# Patient Record
Sex: Male | Born: 1960 | Race: Black or African American | Hispanic: No | Marital: Married | Smoking: Current every day smoker
Health system: Southern US, Community
[De-identification: ages and names within clinical notes are randomized; demographics above are authoritative.]

## PROBLEM LIST (undated history)

## (undated) DIAGNOSIS — E119 Type 2 diabetes mellitus without complications: Secondary | ICD-10-CM

## (undated) DIAGNOSIS — M069 Rheumatoid arthritis, unspecified: Secondary | ICD-10-CM

## (undated) DIAGNOSIS — R0600 Dyspnea, unspecified: Secondary | ICD-10-CM

## (undated) DIAGNOSIS — I10 Essential (primary) hypertension: Secondary | ICD-10-CM

## (undated) DIAGNOSIS — G629 Polyneuropathy, unspecified: Secondary | ICD-10-CM

## (undated) DIAGNOSIS — I639 Cerebral infarction, unspecified: Secondary | ICD-10-CM

## (undated) HISTORY — DX: Polyneuropathy, unspecified: G62.9

## (undated) HISTORY — PX: KNEE SURGERY: SHX244

---

## 2016-08-18 ENCOUNTER — Encounter (HOSPITAL_COMMUNITY): Payer: Self-pay | Admitting: Emergency Medicine

## 2016-08-18 DIAGNOSIS — F17210 Nicotine dependence, cigarettes, uncomplicated: Secondary | ICD-10-CM | POA: Insufficient documentation

## 2016-08-18 DIAGNOSIS — R739 Hyperglycemia, unspecified: Secondary | ICD-10-CM | POA: Insufficient documentation

## 2016-08-18 DIAGNOSIS — R2241 Localized swelling, mass and lump, right lower limb: Secondary | ICD-10-CM | POA: Insufficient documentation

## 2016-08-18 DIAGNOSIS — R51 Headache: Secondary | ICD-10-CM | POA: Insufficient documentation

## 2016-08-18 DIAGNOSIS — I10 Essential (primary) hypertension: Secondary | ICD-10-CM | POA: Insufficient documentation

## 2016-08-18 LAB — CBC
HCT: 46 % (ref 39.0–52.0)
Hemoglobin: 15.3 g/dL (ref 13.0–17.0)
MCH: 29.4 pg (ref 26.0–34.0)
MCHC: 33.3 g/dL (ref 30.0–36.0)
MCV: 88.3 fL (ref 78.0–100.0)
Platelets: 277 10*3/uL (ref 150–400)
RBC: 5.21 MIL/uL (ref 4.22–5.81)
RDW: 13.4 % (ref 11.5–15.5)
WBC: 11.5 10*3/uL — ABNORMAL HIGH (ref 4.0–10.5)

## 2016-08-18 LAB — BASIC METABOLIC PANEL
Anion gap: 12 (ref 5–15)
BUN: 12 mg/dL (ref 6–20)
CO2: 21 mmol/L — ABNORMAL LOW (ref 22–32)
Calcium: 9.5 mg/dL (ref 8.9–10.3)
Chloride: 106 mmol/L (ref 101–111)
Creatinine, Ser: 0.95 mg/dL (ref 0.61–1.24)
GFR calc Af Amer: >60 mL/min (ref >60)
GFR calc non Af Amer: >60 mL/min (ref >60)
Glucose, Bld: 164 mg/dL — ABNORMAL HIGH (ref 65–99)
Potassium: 4.1 mmol/L (ref 3.5–5.1)
Sodium: 139 mmol/L (ref 135–145)

## 2016-08-18 LAB — URINALYSIS, ROUTINE W REFLEX MICROSCOPIC
Bacteria, UA: NONE SEEN
Bilirubin Urine: NEGATIVE
Glucose, UA: >=500 mg/dL — AB
Hgb urine dipstick: NEGATIVE
Ketones, ur: NEGATIVE mg/dL
Leukocytes, UA: NEGATIVE
Nitrite: NEGATIVE
Protein, ur: NEGATIVE mg/dL
Specific Gravity, Urine: 1.016 (ref 1.005–1.030)
pH: 6 (ref 5.0–8.0)

## 2016-08-18 LAB — CBG MONITORING, ED: Glucose-Capillary: 179 mg/dL — ABNORMAL HIGH (ref 65–99)

## 2016-08-18 NOTE — ED Triage Notes (Signed)
Per [facility], pt c/o hyperglycemia, blood sugar at home was 4225, pt took 4 units fast acting insulin, [facility] CBG-204 at 2130, pt also c/o bilateral lower leg swelling. Pt also c/o numbness and tingling to bilateral feet.

## 2016-08-19 ENCOUNTER — Emergency Department (HOSPITAL_COMMUNITY): Payer: Self-pay

## 2016-08-19 ENCOUNTER — Emergency Department (HOSPITAL_COMMUNITY)
Admission: EM | Admit: 2016-08-19 | Discharge: 2016-08-19 | Disposition: A | Discharge status: Home / Self Care | Payer: Self-pay | Attending: Emergency Medicine | Admitting: Emergency Medicine

## 2016-08-19 DIAGNOSIS — I10 Essential (primary) hypertension: Secondary | ICD-10-CM

## 2016-08-19 DIAGNOSIS — E1142 Type 2 diabetes mellitus with diabetic polyneuropathy: Secondary | ICD-10-CM

## 2016-08-19 HISTORY — DX: Essential (primary) hypertension: I10

## 2016-08-19 HISTORY — DX: Type 2 diabetes mellitus without complications: E11.9

## 2016-08-19 LAB — CBG MONITORING, ED: Glucose-Capillary: 140 mg/dL — ABNORMAL HIGH (ref 65–99)

## 2016-08-19 MED ORDER — ACETAMINOPHEN 500 MG PO TABS
1000.0000 mg | ORAL_TABLET | Freq: Once | ORAL | Status: AC
  Administered 2016-08-19: 1000 mg via ORAL
  Filled 2016-08-19: qty 2

## 2016-08-19 MED ORDER — AMLODIPINE BESYLATE 5 MG PO TABS
5.0000 mg | ORAL_TABLET | Freq: Once | ORAL | Status: AC
  Administered 2016-08-19: 5 mg via ORAL
  Filled 2016-08-19: qty 1

## 2016-08-19 MED ORDER — GABAPENTIN 100 MG PO CAPS: 100.0000 mg | ORAL_CAPSULE | Freq: Three times a day (TID) | ORAL | 0 refills | Status: DC

## 2016-08-19 MED ORDER — AMLODIPINE BESYLATE 5 MG PO TABS: 5.0000 mg | ORAL_TABLET | Freq: Every day | ORAL | 0 refills | Status: DC

## 2016-08-19 NOTE — ED Provider Notes (Signed)
By signing my name below, I, Georgette Shell, attest that this documentation has been prepared under the direction and in the presence of Winta Barcelo, Delice Bison, DO. Electronically Signed: Georgette Shell, ED Scribe. 08/19/16. 2:37 AM.  TIME SEEN: 2:08 AM  CHIEF COMPLAINT:  Chief Complaint  Patient presents with  . Hypertension  . Hyperglycemia   HPI:  HPI Comments: Tony Ortega is a 56 y.o. male with h/o DM and HTN, who presents to the Emergency Department complaining of gradually worsening bilateral foot pain beginning in January 2018. States he thinks is from his diabetes. He has been told he has neuropathy. He states he decided to come to the ED today because he noticed swelling and discoloration to the area earlier but this has improved. Pt has h/o DM and notes his blood sugar was elevated at 425; he then subsequently took 4 units of fast-acting insulin which then brought it down to 204 at ~9:30p last night.   Pt is also complaining of a headache onset 4-5 hours ago. Pt has been compliant with his medications. He notes he has been incarcerated for 30 years and was just released yesterday and states he does not have a PCP he regularly follows up with at this time. Pt denies fever, chills, focal numbness, focal weakness, vomiting, diarrhea, visual disturbance, difficulty breathing, or any other associated symptoms.  ROS: See HPI Constitutional: no fever  Eyes: no drainage  ENT: no runny nose   Cardiovascular: no chest pain  Resp: no SOB  GI: no vomiting GU: no dysuria Integumentary: no rash  Allergy: no hives  Musculoskeletal: leg swelling  Neurological: no slurred speech ROS otherwise negative  PAST MEDICAL HISTORY/PAST SURGICAL HISTORY:  Past Medical History:  Diagnosis Date  . Diabetes mellitus without complication ([place])   . Hypertension     MEDICATIONS:  Prior to Admission medications   Not on File    ALLERGIES:  No Known Allergies  SOCIAL HISTORY:  Social History  Substance Use  Topics  . Smoking status: Current Some Day Smoker  . Smokeless tobacco: Never Used  . Alcohol use No    FAMILY HISTORY: No family history on file.  EXAM: BP (!) 150/112 (BP Location: Right Arm)   Pulse 86   Temp 98.1 F (36.7 C) (Oral)   Resp 16   Ht 5\' 7"  (1.702 m)   Wt 160 lb (72.6 kg)   SpO2 99%   BMI 25.06 kg/m  CONSTITUTIONAL: Alert and oriented and responds appropriately to questions. Chronically ill-appearing HEAD: Normocephalic EYES: Conjunctivae clear, pupils appear equal, EOMI ENT: normal nose; moist mucous membranes NECK: Supple, no meningismus, no nuchal rigidity, no LAD  CARD: RRR; S1 and S2 appreciated; no murmurs, no clicks, no rubs, no gallops RESP: Normal chest excursion without splinting or tachypnea; breath sounds clear and equal bilaterally; no wheezes, no rhonchi, no rales, no hypoxia or respiratory distress, speaking full sentences ABD/GI: Normal bowel sounds; non-distended; soft, non-tender, no rebound, no guarding, no peritoneal signs, no hepatosplenomegaly BACK:  The back appears normal and is non-tender to palpation, there is no CVA tenderness EXT: Normal ROM in all joints; non-tender to palpation; no edema; normal capillary refill; no cyanosis, no calf tenderness or swelling, 2+ radial and DP pulses palpated bilaterally, extremity is warm and well-perfused, patient does have some chronic venous stasis dermatitis noted to both ankles but no swelling, erythema, warmth or joint effusion    SKIN: Normal color for age and race; warm; no rash NEURO: Moves all extremities  equally; Strength 5/5 in all four extremities.  Normal sensation diffusely.  CN 2-12 grossly intact.  No dysmetria to finger to nose testing bilaterally.  Normal speech.  Normal gait. PSYCH: The patient's mood and manner are appropriate. Grooming and personal hygiene are appropriate.  MEDICAL DECISION MAKING: Patient here with complaints of chronic neuropathy since January. I think this is  related to his diabetes. Was hyperglycemic earlier but this is now with insulin. Blood glucose is now on 140.  He is hypertensive here and complains of mild headache. Headache present for the past 4-5 hours. We'll obtain CT of his head for further evaluation. Will give amlodipine. He is on hydrochlorothiazide and lisinopril and reports compliance.  ED PROGRESS: Head CT unremarkable.  4:40 AM  Pt's blood pressure is 159/107. Headache completely resolved after Tylenol. I feel he is safe to be discharged home. We'll get outpatient PCP follow-up. We'll start him on low-dose gabapentin for his neuropathy. No other sign of end organ damage from his hypertension. Discussed return precautions. Patient is comfortable with this plan.    At this time, I do not feel there is any life-threatening condition present. I have reviewed and discussed all results (EKG, imaging, lab, urine as appropriate) and exam findings with patient/family. I have reviewed nursing notes and appropriate previous records.  I feel the patient is safe to be discharged home without further emergent workup and can continue workup as an outpatient as needed. Discussed usual and customary return precautions. Patient/family verbalize understanding and are comfortable with this plan.  Outpatient follow-up has been provided if needed. All questions have been answered.  I personally performed the services described in this documentation, which was scribed in my presence. The recorded information has been reviewed and is accurate.    Lindamarie Maclachlan, Delice Bison, DO 08/19/16 [phone]

## 2016-08-19 NOTE — Discharge Instructions (Signed)
Please continue all of your medications as prescribed including your hydrochlorothiazide and lisinopril. We are adding amlodipine to help with her blood pressure. I recommend close follow-up with a primary care physician for monitoring your blood pressure, diabetes and your lower extremity neuropathy.  We are starting you on a low dose gabapentin to help with your diabetic neuropathy. You will need to see a primary care physician to have these prescriptions continued and adjusted as needed.   To find a primary care or specialty doctor please call [phone] or [phone] to access "[facility]."  You may also go on the [facility] website at [email or web address]  There are also multiple [facility], [facility], [facility] and [facility] practices throughout the [place] that are frequently accepting new patients. You may find a clinic that is close to your home and contact them.  [place] [id number] [place]  [place] [address] [place] [phone]

## 2016-08-30 ENCOUNTER — Encounter (HOSPITAL_COMMUNITY): Payer: Self-pay | Admitting: Emergency Medicine

## 2016-08-30 ENCOUNTER — Emergency Department (HOSPITAL_COMMUNITY)
Admission: EM | Admit: 2016-08-30 | Discharge: 2016-08-30 | Disposition: A | Discharge status: Home / Self Care | Payer: Self-pay | Attending: Emergency Medicine | Admitting: Emergency Medicine

## 2016-08-30 DIAGNOSIS — I10 Essential (primary) hypertension: Secondary | ICD-10-CM | POA: Insufficient documentation

## 2016-08-30 DIAGNOSIS — E1140 Type 2 diabetes mellitus with diabetic neuropathy, unspecified: Secondary | ICD-10-CM | POA: Insufficient documentation

## 2016-08-30 DIAGNOSIS — E1169 Type 2 diabetes mellitus with other specified complication: Secondary | ICD-10-CM | POA: Insufficient documentation

## 2016-08-30 DIAGNOSIS — Z79899 Other long term (current) drug therapy: Secondary | ICD-10-CM | POA: Insufficient documentation

## 2016-08-30 DIAGNOSIS — F17210 Nicotine dependence, cigarettes, uncomplicated: Secondary | ICD-10-CM | POA: Insufficient documentation

## 2016-08-30 DIAGNOSIS — Z794 Long term (current) use of insulin: Secondary | ICD-10-CM | POA: Insufficient documentation

## 2016-08-30 MED ORDER — ACETAMINOPHEN 500 MG PO TABS: 500.0000 mg | ORAL_TABLET | Freq: Four times a day (QID) | ORAL | 0 refills | Status: DC | PRN

## 2016-08-30 NOTE — ED Triage Notes (Addendum)
Per EMS. Pt having L foot pain. No recent injury or wounds noted. Pt reports his CBG has been higher than normal. CBG 194 with EMS. Pt reports he was recently in prison.

## 2016-08-30 NOTE — Discharge Instructions (Addendum)
Mr. Tony Ortega needs close follow up with primary care for management of his diabetes and hypertension. Please allow him to make an appointment with the [facility]ness center and see them for managment. He needs to fill his prescriptions for his medications as well.

## 2016-08-30 NOTE — ED Provider Notes (Signed)
[facility] DEPT Provider Note   CSN: [id number] Arrival date & time: 08/30/16  1902     History   Chief Complaint Chief Complaint  Patient presents with  . Foot Pain    HPI Tony Ortega is a 56 y.o. male.  Tony Ortega is a 56 y.o. Male with a history of diabetes who presents to the emergency department complaining of left foot pain and tingling ongoing for the past 8 months. Patient reports he was recently released from prison after staying there for 30 years. He is at a halfway house currently. He tells me he's been having pain to the bottom of his left foot as well as tingling ongoing for about 8 months now. He tells me is been taking his metformin as prescribed. EMS reports his blood sugar is 194. No treatments attempted prior to arrival for his foot pain. He was seen in the emergency department last week and was started on gabapentin for his diabetic neuropathy. He reports he has not been able to fill this prescription yet. He is not being followed by primary care provider yet. He denies any foot injury or trauma. He denies fevers, coughing, shortness of breath, foot injury, trouble ambulating, leg swelling, or weakness.   The history is provided by the patient and medical records. No language interpreter was used.  Foot Pain  Pertinent negatives include no chest pain, no abdominal pain and no shortness of breath.    Past Medical History:  Diagnosis Date  . Diabetes mellitus without complication ([place])   . Hypertension     There are no active problems to display for this patient.   Past Surgical History:  Procedure Laterality Date  . KNEE SURGERY         Home Medications    Prior to Admission medications   Medication Sig Start Date End Date Taking? Authorizing Provider  acetaminophen (TYLENOL) 500 MG tablet Take 1 tablet (500 mg total) by mouth every 6 (six) hours as needed for mild pain or moderate pain. 08/30/16   Waynetta Pean, PA-C  amLODipine (NORVASC)  5 MG tablet Take 1 tablet (5 mg total) by mouth daily. 08/19/16   Ward, Delice Bison, DO  gabapentin (NEURONTIN) 100 MG capsule Take 1 capsule (100 mg total) by mouth 3 (three) times daily. Take 1 tablet once a day for 3 days then 1 tablet twice a day for 3 days then take 3 tablets 3 times a day. 08/19/16   Ward, Delice Bison, DO    Family History History reviewed. No pertinent family history.  Social History Social History  Substance Use Topics  . Smoking status: Current Some Day Smoker  . Smokeless tobacco: Never Used  . Alcohol use No     Allergies   Patient has no known allergies.   Review of Systems Review of Systems  Constitutional: Negative for chills and fever.  HENT: Negative for congestion and sore throat.   Eyes: Negative for visual disturbance.  Respiratory: Negative for cough and shortness of breath.   Cardiovascular: Negative for chest pain.  Gastrointestinal: Negative for abdominal pain, diarrhea, nausea and vomiting.  Genitourinary: Negative for difficulty urinating and dysuria.  Musculoskeletal: Positive for arthralgias. Negative for back pain and neck pain.  Skin: Negative for rash.  Neurological: Negative for weakness.     Physical Exam Updated Vital Signs BP (!) 148/104   Pulse 83   Temp 99 F (37.2 C) (Oral)   Resp 16   SpO2 96%   Physical Exam  Constitutional: He appears well-developed and well-nourished. No distress.  Nontoxic appearing.  HENT:  Head: Normocephalic and atraumatic.  Mouth/Throat: Oropharynx is clear and moist.  Eyes: Pupils are equal, round, and reactive to light. Conjunctivae are normal. Right eye exhibits no discharge. Left eye exhibits no discharge.  Neck: Neck supple.  Cardiovascular: Normal rate, regular rhythm, normal heart sounds and intact distal pulses.   Pulmonary/Chest: Effort normal and breath sounds normal. No respiratory distress. He has no wheezes. He has no rales.  Abdominal: Soft. There is no tenderness.    Musculoskeletal: Normal range of motion. He exhibits no edema or deformity.  No foot edema, erythema, warmth or deformity noted bilaterally. Good range of motion of his toes and ankles bilaterally. He reports diminished sensation to the plantar surface of his left foot. He reports sensation is intact, however diminished.  Lymphadenopathy:    He has no cervical adenopathy.  Neurological: He is alert. Coordination normal.  Skin: Skin is warm and dry. Capillary refill takes less than 2 seconds. No rash noted. He is not diaphoretic. No erythema. No pallor.  Psychiatric: He has a normal mood and affect. His behavior is normal.  Nursing note and vitals reviewed.    ED Treatments / Results  Labs (all labs ordered are listed, but only abnormal results are displayed) Labs Reviewed - No data to display  EKG  EKG Interpretation None       Radiology No results found.  Procedures Procedures (including critical care time)  Medications Ordered in ED Medications - No data to display   Initial Impression / Assessment and Plan / ED Course  I have reviewed the triage vital signs and the nursing notes.  Pertinent labs & imaging results that were available during my care of the patient were reviewed by me and considered in my medical decision making (see chart for details).    This is a 56 y.o. Male with a history of diabetes who presents to the emergency department complaining of left foot pain and tingling ongoing for the past 8 months. Patient reports he was recently released from prison after staying there for 30 years. He is at a halfway house currently. He tells me he's been having pain to the bottom of his left foot as well as tingling ongoing for about 8 months now. He tells me is been taking his metformin as prescribed. EMS reports his blood sugar is 194. No treatments attempted prior to arrival for his foot pain. He was seen in the emergency department last week and was started on  gabapentin for his diabetic neuropathy. He reports he has not been able to fill this prescription yet. He is not being followed by primary care provider yet. He denies any foot injury or trauma. On examination patient is afebrile nontoxic appearing. Patient with diabetic neuropathy of his left foot. Blood sugar is 194. Patient was given prescription for gabapentin, however he has not filled this yet. He needs a primary care provider to help manage his diabetes and diabetic neuropathy. Will provide him with follow-up with the wellness Center. Tylenol as needed for pain. Return precautions discussed. I advised the patient to follow-up with their primary care provider this week. I advised the patient to return to the emergency department with new or worsening symptoms or new concerns. The patient verbalized understanding and agreement with plan.      Final Clinical Impressions(s) / ED Diagnoses   Final diagnoses:  Diabetic neuropathy, painful ([place])  Type 2 diabetes mellitus  with other specified complication, with long-term current use of insulin ([facility])  Essential hypertension    New Prescriptions New Prescriptions   ACETAMINOPHEN (TYLENOL) 500 MG TABLET    Take 1 tablet (500 mg total) by mouth every 6 (six) hours as needed for mild pain or moderate pain.     Waynetta Pean, PA-C 08/30/16 2349    Virgel Manifold, MD 09/06/16 1218

## 2016-09-22 ENCOUNTER — Ambulatory Visit (INDEPENDENT_AMBULATORY_CARE_PROVIDER_SITE_OTHER): Payer: Self-pay | Admitting: Physician Assistant

## 2016-11-29 ENCOUNTER — Emergency Department (HOSPITAL_COMMUNITY)
Admission: EM | Admit: 2016-11-29 | Discharge: 2016-11-29 | Disposition: A | Discharge status: Home / Self Care | Payer: Medicaid Other | Attending: Emergency Medicine | Admitting: Emergency Medicine

## 2016-11-29 ENCOUNTER — Encounter (HOSPITAL_COMMUNITY): Payer: Self-pay | Admitting: Emergency Medicine

## 2016-11-29 DIAGNOSIS — M79672 Pain in left foot: Secondary | ICD-10-CM | POA: Diagnosis present

## 2016-11-29 DIAGNOSIS — Z5321 Procedure and treatment not carried out due to patient leaving prior to being seen by health care provider: Secondary | ICD-10-CM | POA: Diagnosis not present

## 2016-11-29 LAB — BASIC METABOLIC PANEL
Anion gap: 11 (ref 5–15)
BUN: 8 mg/dL (ref 6–20)
CO2: 23 mmol/L (ref 22–32)
Calcium: 9.1 mg/dL (ref 8.9–10.3)
Chloride: 104 mmol/L (ref 101–111)
Creatinine, Ser: 0.86 mg/dL (ref 0.61–1.24)
GFR calc Af Amer: >60 mL/min (ref >60)
GFR calc non Af Amer: >60 mL/min (ref >60)
Glucose, Bld: 126 mg/dL — ABNORMAL HIGH (ref 65–99)
Potassium: 3 mmol/L — ABNORMAL LOW (ref 3.5–5.1)
Sodium: 138 mmol/L (ref 135–145)

## 2016-11-29 LAB — CBG MONITORING, ED: Glucose-Capillary: 137 mg/dL — ABNORMAL HIGH (ref 65–99)

## 2016-11-29 LAB — URINALYSIS, ROUTINE W REFLEX MICROSCOPIC
Bacteria, UA: NONE SEEN
Glucose, UA: NEGATIVE mg/dL
Hgb urine dipstick: NEGATIVE
Ketones, ur: NEGATIVE mg/dL
Leukocytes, UA: NEGATIVE
Nitrite: NEGATIVE
Protein, ur: 30 mg/dL — AB
Specific Gravity, Urine: 1.023 (ref 1.005–1.030)
pH: 5 (ref 5.0–8.0)

## 2016-11-29 LAB — CBC
HCT: 47 % (ref 39.0–52.0)
Hemoglobin: 16 g/dL (ref 13.0–17.0)
MCH: 30 pg (ref 26.0–34.0)
MCHC: 34 g/dL (ref 30.0–36.0)
MCV: 88 fL (ref 78.0–100.0)
Platelets: 276 10*3/uL (ref 150–400)
RBC: 5.34 MIL/uL (ref 4.22–5.81)
RDW: 13.5 % (ref 11.5–15.5)
WBC: 6.8 10*3/uL (ref 4.0–10.5)

## 2016-11-29 NOTE — ED Notes (Signed)
PT had to leave as his ride had to leave the ED, tried to see if PT could wait a little longer but needed to go. Removed PT arm band.

## 2016-11-29 NOTE — ED Triage Notes (Signed)
Pt states new onset left sided foot pain, no wounds noted on assessment. Bottom of foot is red. No localized wounds noted. Foot is warm. Pt is a diabetic, no known history of diabetic neuropathy. Pt states he cannot feel his left foot.

## 2017-03-30 ENCOUNTER — Ambulatory Visit (HOSPITAL_COMMUNITY)
Admission: EM | Admit: 2017-03-30 | Discharge: 2017-03-30 | Disposition: A | Discharge status: Home / Self Care | Payer: Self-pay | Attending: Internal Medicine | Admitting: Internal Medicine

## 2017-03-30 ENCOUNTER — Encounter (HOSPITAL_COMMUNITY): Payer: Self-pay | Admitting: Family Medicine

## 2017-03-30 DIAGNOSIS — E119 Type 2 diabetes mellitus without complications: Secondary | ICD-10-CM

## 2017-03-30 DIAGNOSIS — Z79899 Other long term (current) drug therapy: Secondary | ICD-10-CM

## 2017-03-30 DIAGNOSIS — Z760 Encounter for issue of repeat prescription: Secondary | ICD-10-CM

## 2017-03-30 DIAGNOSIS — Z794 Long term (current) use of insulin: Secondary | ICD-10-CM

## 2017-03-30 DIAGNOSIS — I10 Essential (primary) hypertension: Secondary | ICD-10-CM

## 2017-03-30 LAB — GLUCOSE, CAPILLARY: Glucose-Capillary: 146 mg/dL — ABNORMAL HIGH (ref 65–99)

## 2017-03-30 MED ORDER — GLUCOSE BLOOD VI STRP: ORAL_STRIP | 12 refills | Status: DC

## 2017-03-30 MED ORDER — HYDROCHLOROTHIAZIDE 25 MG PO TABS: 25.0000 mg | ORAL_TABLET | Freq: Every day | ORAL | 0 refills | Status: DC

## 2017-03-30 NOTE — Discharge Instructions (Signed)
Please continue with daily hydrochlorothiazide. Please continue to monitor your blood sugar and treat as prescribed by your PCP. Keep appointment with your PCP for further management of your medications.

## 2017-03-30 NOTE — ED Provider Notes (Signed)
[facility]    CSN: [id number] Arrival date & time: 03/30/17  1729     History   Chief Complaint Chief Complaint  Patient presents with  . Medication Refill    HPI Tony Ortega is a 57 y.o. male.   Tony Ortega presents with requests for refill of his hctz which he ran out of. He states he has a history of htn as well as DM. He has ran out of glucometer strips. He uses insuline to control diabetes. Takes amlodipine as well but currently still has this. Has an appointment with his PCP 2/27. Creatinine 11/2016 was 0.86. Denies chest pain, palpitations, headache. States he feels he has some increased pedal edema present.   ROS per HPI.       Past Medical History:  Diagnosis Date  . Diabetes mellitus without complication ([place])   . Hypertension     There are no active problems to display for this patient.   Past Surgical History:  Procedure Laterality Date  . KNEE SURGERY         Home Medications    Prior to Admission medications   Medication Sig Start Date End Date Taking? Authorizing Provider  acetaminophen (TYLENOL) 500 MG tablet Take 1 tablet (500 mg total) by mouth every 6 (six) hours as needed for mild pain or moderate pain. 08/30/16   Waynetta Pean, PA-C  gabapentin (NEURONTIN) 100 MG capsule Take 1 capsule (100 mg total) by mouth 3 (three) times daily. Take 1 tablet once a day for 3 days then 1 tablet twice a day for 3 days then take 3 tablets 3 times a day. 08/19/16   Ward, Delice Bison, DO  glucose blood test strip Use as instructed 03/30/17   Augusto Gamble B, NP  hydrochlorothiazide (HYDRODIURIL) 25 MG tablet Take 1 tablet (25 mg total) by mouth daily. 03/30/17   Zigmund Gottron, NP    Family History History reviewed. No pertinent family history.  Social History Social History   Tobacco Use  . Smoking status: Current Some Day Smoker  . Smokeless tobacco: Never Used  Substance Use Topics  . Alcohol use: No  . Drug use: No     Allergies     Patient has no known allergies.   Review of Systems Review of Systems   Physical Exam Triage Vital Signs ED Triage Vitals [03/30/17 1840]  Enc Vitals Group     BP 139/79     Pulse Rate 75     Resp 18     Temp 98.6 F (37 C)     Temp src      SpO2 100 %     Weight      Height      Head Circumference      Peak Flow      Pain Score      Pain Loc      Pain Edu?      Excl. in [place]?    No data found.  Updated Vital Signs BP 139/79   Pulse 75   Temp 98.6 F (37 C)   Resp 18   SpO2 100%   Visual Acuity Right Eye Distance:   Left Eye Distance:   Bilateral Distance:    Right Eye Near:   Left Eye Near:    Bilateral Near:     Physical Exam  Constitutional: He is oriented to person, place, and time. He appears well-developed and well-nourished.  Cardiovascular: Normal rate and regular rhythm.  Pulmonary/Chest: Effort  normal and breath sounds normal.  Musculoskeletal:  Without pitting edema noted to bilateral lower extremities; sensation intact  Neurological: He is alert and oriented to person, place, and time.  Skin: Skin is warm and dry.     UC Treatments / Results  Labs (all labs ordered are listed, but only abnormal results are displayed) Labs Reviewed  GLUCOSE, CAPILLARY - Abnormal; Notable for the following components:      Result Value   Glucose-Capillary 146 (*)    All other components within normal limits    EKG  EKG Interpretation None       Radiology No results found.  Procedures Procedures (including critical care time)  Medications Ordered in UC Medications - No data to display   Initial Impression / Assessment and Plan / UC Course  I have reviewed the triage vital signs and the nursing notes.  Pertinent labs & imaging results that were available during my care of the patient were reviewed by me and considered in my medical decision making (see chart for details).     BS 146 in clinic today. Script written for additional strips  for continued monitoring. Refill of hctz today, patient provided empty bottle in clinic. Creatinine last documented 11/2016 and WNL. Encouraged follow up as already scheduled with PCP for continued management of symptoms. Return precautions provided. Patient verbalized understanding and agreeable to plan.    Final Clinical Impressions(s) / UC Diagnoses   Final diagnoses:  Encounter for medication refill  Essential hypertension  Type 2 diabetes mellitus without complication, with long-term current use of insulin [facility])    ED Discharge Orders        Ordered    hydrochlorothiazide (HYDRODIURIL) 25 MG tablet  Daily     03/30/17 1902    glucose blood test strip     03/30/17 1902       Controlled Substance Prescriptions [place] Controlled Substance Registry consulted? Not Applicable   Zigmund Gottron, NP 03/30/17 1910

## 2017-03-30 NOTE — ED Triage Notes (Signed)
Pt here for BP med refill. stst hat his BP has been elevated and edema in BLE. Denies any chest pain or SOB. He hasnt had his meds in 3 days.

## 2017-04-03 ENCOUNTER — Encounter (HOSPITAL_COMMUNITY): Payer: Self-pay

## 2017-04-03 ENCOUNTER — Emergency Department (HOSPITAL_COMMUNITY)
Admission: EM | Admit: 2017-04-03 | Discharge: 2017-04-03 | Disposition: A | Discharge status: Home / Self Care | Payer: Medicaid Other | Attending: Emergency Medicine | Admitting: Emergency Medicine

## 2017-04-03 DIAGNOSIS — E119 Type 2 diabetes mellitus without complications: Secondary | ICD-10-CM | POA: Insufficient documentation

## 2017-04-03 DIAGNOSIS — F17210 Nicotine dependence, cigarettes, uncomplicated: Secondary | ICD-10-CM | POA: Diagnosis not present

## 2017-04-03 DIAGNOSIS — I10 Essential (primary) hypertension: Secondary | ICD-10-CM | POA: Insufficient documentation

## 2017-04-03 DIAGNOSIS — R42 Dizziness and giddiness: Secondary | ICD-10-CM | POA: Diagnosis not present

## 2017-04-03 LAB — CBC
HCT: 49.4 % (ref 39.0–52.0)
Hemoglobin: 16.8 g/dL (ref 13.0–17.0)
MCH: 30.8 pg (ref 26.0–34.0)
MCHC: 34 g/dL (ref 30.0–36.0)
MCV: 90.6 fL (ref 78.0–100.0)
Platelets: 293 10*3/uL (ref 150–400)
RBC: 5.45 MIL/uL (ref 4.22–5.81)
RDW: 13.6 % (ref 11.5–15.5)
WBC: 8.8 10*3/uL (ref 4.0–10.5)

## 2017-04-03 LAB — BASIC METABOLIC PANEL
Anion gap: 14 (ref 5–15)
BUN: 15 mg/dL (ref 6–20)
CO2: 22 mmol/L (ref 22–32)
Calcium: 9.8 mg/dL (ref 8.9–10.3)
Chloride: 97 mmol/L — ABNORMAL LOW (ref 101–111)
Creatinine, Ser: 0.88 mg/dL (ref 0.61–1.24)
GFR calc Af Amer: >60 mL/min (ref >60)
GFR calc non Af Amer: >60 mL/min (ref >60)
Glucose, Bld: 210 mg/dL — ABNORMAL HIGH (ref 65–99)
Potassium: 3.8 mmol/L (ref 3.5–5.1)
Sodium: 133 mmol/L — ABNORMAL LOW (ref 135–145)

## 2017-04-03 LAB — CBG MONITORING, ED
Glucose-Capillary: 241 mg/dL — ABNORMAL HIGH (ref 65–99)
Glucose-Capillary: 190 mg/dL — ABNORMAL HIGH (ref 65–99)

## 2017-04-03 MED ORDER — MECLIZINE HCL 12.5 MG PO TABS: 12.5000 mg | ORAL_TABLET | Freq: Three times a day (TID) | ORAL | 0 refills | Status: DC | PRN

## 2017-04-03 NOTE — ED Notes (Signed)
Patient asked to get undressed, and put on hospital gown. He stated that he was no getting undressed, and was going to leave. I informed him to get on bed, and I would inform the Dr, that he was refusing to get into hospital gown. I also informed him, that I would let the DR. Know he was ready to be seen.

## 2017-04-03 NOTE — Discharge Instructions (Signed)
As we discussed, though you do have high blood pressure (hypertension), fortunately it is not immediately dangerous at this time and does not need emergency intervention or admission to the hospital.  If we add to or change your regular medications, we may cause more harm than good - it is more appropriate for your primary care doctor to evaluate you in clinic and decide if any medication changes are needed.  Please follow up in clinic as recommended in these papers.    Take the meclizine as needed for dizziness.   Return to the Emergency Department (ED) if you experience any worsening chest pain/pressure/tightness, difficulty breathing, or sudden sweating, or other symptoms that concern you.   Hypertension Hypertension, commonly called high blood pressure, is when the force of blood pumping through your arteries is too strong. Your arteries are the blood vessels that carry blood from your heart throughout your body. A blood pressure reading consists of a higher number over a lower number, such as 110/72. The higher number (systolic) is the pressure inside your arteries when your heart pumps. The lower number (diastolic) is the pressure inside your arteries when your heart relaxes. Ideally you want your blood pressure below 120/80. Hypertension forces your heart to work harder to pump blood. Your arteries may become narrow or stiff. Having hypertension puts you at risk for heart disease, stroke, and other problems.  RISK FACTORS Some risk factors for high blood pressure are controllable. Others are not.  Risk factors you cannot control include:  Race. You may be at higher risk if you are African American. Age. Risk increases with age. Gender. Men are at higher risk than women before age 43 years. After age 1, women are at higher risk than men. Risk factors you can control include: Not getting enough exercise or physical activity. Being overweight. Getting too much fat, sugar, calories, or salt in  your diet. Drinking too much alcohol. SIGNS AND SYMPTOMS Hypertension does not usually cause signs or symptoms. Extremely high blood pressure (hypertensive crisis) may cause headache, anxiety, shortness of breath, and nosebleed. DIAGNOSIS  To check if you have hypertension, your health care provider will measure your blood pressure while you are seated, with your arm held at the level of your heart. It should be measured at least twice using the same arm. Certain conditions can cause a difference in blood pressure between your right and left arms. A blood pressure reading that is higher than normal on one occasion does not mean that you need treatment. If one blood pressure reading is high, ask your health care provider about having it checked again. TREATMENT  Treating high blood pressure includes making lifestyle changes and possibly taking medicine. Living a healthy lifestyle can help lower high blood pressure. You may need to change some of your habits. Lifestyle changes may include: Following the DASH diet. This diet is high in fruits, vegetables, and whole grains. It is low in salt, red meat, and added sugars. Getting at least 2 hours of brisk physical activity every week. Losing weight if necessary. Not smoking. Limiting alcoholic beverages. Learning ways to reduce stress.  If lifestyle changes are not enough to get your blood pressure under control, your health care provider may prescribe medicine. You may need to take more than one. Work closely with your health care provider to understand the risks and benefits. HOME CARE INSTRUCTIONS Have your blood pressure rechecked as directed by your health care provider.   Take medicines only as directed by  your health care provider. Follow the directions carefully. Blood pressure medicines must be taken as prescribed. The medicine does not work as well when you skip doses. Skipping doses also puts you at risk for problems.   Do not smoke.     Monitor your blood pressure at home as directed by your health care provider.  SEEK MEDICAL CARE IF:  You think you are having a reaction to medicines taken. You have recurrent headaches or feel dizzy. You have swelling in your ankles. You have trouble with your vision. SEEK IMMEDIATE MEDICAL CARE IF: You develop a severe headache or confusion. You have unusual weakness, numbness, or feel faint. You have severe chest or abdominal pain. You vomit repeatedly. You have trouble breathing. MAKE SURE YOU:  Understand these instructions. Will watch your condition. Will get help right away if you are not doing well or get worse. Document Released: 01/26/2005 Document Revised: 06/12/2013 Document Reviewed: 11/18/2012 [facility] Patient Information 2015 [place], [place]. This information is not intended to replace advice given to you by your health care provider. Make sure you discuss any questions you have with your health care provider.  How to Take Your Blood Pressure HOW DO I GET A BLOOD PRESSURE MACHINE? You can buy an electronic home blood pressure machine at your local pharmacy. Insurance will sometimes cover the cost if you have a prescription. Ask your doctor what type of machine is best for you. There are different machines for your arm and your wrist. If you decide to buy a machine to check your blood pressure on your arm, first check the size of your arm so you can buy the right size cuff. To check the size of your arm:   Use a measuring tape that shows both inches and centimeters.   Wrap the measuring tape around the upper-middle part of your arm. You may need someone to help you measure.   Write down your arm measurement in both inches and centimeters.   To measure your blood pressure correctly, it is important to have the right size cuff.   If your arm is up to 13 inches (up to 34 centimeters), get an adult cuff size. If your arm is 13 to 17 inches (35 to 44 centimeters), get a large  adult cuff size.    If your arm is 17 to 20 inches (45 to 52 centimeters), get an adult thigh cuff.   WHAT DO THE NUMBERS MEAN?  There are two numbers that make up your blood pressure. For example: 120/80. The first number (120 in our example) is called the "systolic pressure." It is a measure of the pressure in your blood vessels when your heart is pumping blood. The second number (80 in our example) is called the "diastolic pressure." It is a measure of the pressure in your blood vessels when your heart is resting between beats. Your doctor will tell you what your blood pressure should be. WHAT SHOULD I DO BEFORE I CHECK MY BLOOD PRESSURE?  Try to rest or relax for at least 30 minutes before you check your blood pressure. Do not smoke. Do not have any drinks with caffeine, such as: Soda. Coffee. Tea. Check your blood pressure in a quiet room. Sit down and stretch out your arm on a table. Keep your arm at about the level of your heart. Let your arm relax. Make sure that your legs are not crossed. HOW DO I CHECK MY BLOOD PRESSURE? Follow the directions that came with your  machine. Make sure you remove any tight-fighting clothing from your arm or wrist. Wrap the cuff around your upper arm or wrist. You should be able to fit a finger between the cuff and your arm. If you cannot fit a finger between the cuff and your arm, it is too tight and should be removed and rewrapped. Some units require you to manually pump up the arm cuff. Automatic units inflate the cuff when you press a button. Cuff deflation is automatic in both models. After the cuff is inflated, the unit measures your blood pressure and pulse. The readings are shown on a monitor. Hold still and breathe normally while the cuff is inflated. Getting a reading takes less than a minute. Some models store readings in a memory. Some provide a printout of readings. If your machine does not store your readings, keep a written record. Take  readings with you to your next visit with your doctor. Document Released: 01/09/2008 Document Revised: 06/12/2013 Document Reviewed: 03/23/2013 [facility] Patient Information 2015 [place], [place]. This information is not intended to replace advice given to you by your health care provider. Make sure you discuss any questions you have with your health care provider.

## 2017-04-03 NOTE — ED Triage Notes (Signed)
Per Pt, Pt is coming from home where he was noted to feel dizzy this morning. Pt has hx of diabetes and HTN. Checked blood sugar and it was 124. Pt went to EMS Station to have BP checked and it was slightly elevated. Pt came to be evaluated for HTN. Denies CP or SOB.

## 2017-04-03 NOTE — ED Provider Notes (Signed)
Emergency Department Provider Note   I have reviewed the triage vital signs and the nursing notes.   HISTORY  Chief Complaint Dizziness   HPI Tony Ortega is a 57 y.o. male with PMH of DM and HTN's to the emergency department for evaluation of elevated blood pressures with some associated lightheadedness.  The patient describes a sensation of being lightheaded but also some mild spinning.  No modifying factors.  He is not currently having symptoms but was earlier today.  He describes a situation of waking up and checking his blood pressure only to find that it was elevated.  He continue taking it throughout the morning and became very concerned when it continued to go up.  He denies any chest pain or dyspnea.  No fevers or chills.  Patient has been compliant with his HCTZ and amlodipine.  He does follow along with his primary care physician Dr. Synthia Innocent.    Past Medical History:  Diagnosis Date  . Diabetes mellitus without complication ([place])   . Hypertension     There are no active problems to display for this patient.   Past Surgical History:  Procedure Laterality Date  . KNEE SURGERY      Current Outpatient Rx  . Order #: [id number] Class: Print  . Order #: [id number] Class: Print  . Order #: [id number] Class: Historical Med  . Order #: [id number] Class: Historical Med  . Order #: [id number] Class: Print  . Order #: [id number] Class: Print  . Order #: [id number] Class: Print    Allergies Gabapentin  No family history on file.  Social History Social History   Tobacco Use  . Smoking status: Current Some Day Smoker    Packs/day: 1.00    Types: Cigarettes  . Smokeless tobacco: Never Used  Substance Use Topics  . Alcohol use: No  . Drug use: No    Review of Systems  Constitutional: No fever/chills. Positive lightheadedness.  Eyes: No visual changes. ENT: No sore throat. Cardiovascular: Denies chest pain. Elevated BP.  Respiratory: Denies shortness of  breath. Gastrointestinal: No abdominal pain.  No nausea, no vomiting.  No diarrhea.  No constipation. Genitourinary: Negative for dysuria. Musculoskeletal: Negative for back pain. Skin: Negative for rash. Neurological: Negative for headaches, focal weakness or numbness.  10-point ROS otherwise negative.  ____________________________________________   PHYSICAL EXAM:  VITAL SIGNS: ED Triage Vitals  Enc Vitals Group     BP 04/03/17 1253 (!) 175/103     Pulse Rate 04/03/17 1253 95     Resp 04/03/17 1253 (!) 24     Temp 04/03/17 1253 97.6 F (36.4 C)     Temp Source 04/03/17 1253 Oral     SpO2 04/03/17 1253 100 %     Weight 04/03/17 1258 150 lb (68 kg)     Height 04/03/17 1258 5\' 6"  (1.676 m)   Constitutional: Alert and oriented. Well appearing and in no acute distress. Eyes: Conjunctivae are normal. PERRL. EOMI. Head: Atraumatic. Nose: No congestion/rhinnorhea. Mouth/Throat: Mucous membranes are moist.  Oropharynx non-erythematous. Normal TMs without erythema.  Neck: No stridor.  Cardiovascular: Normal rate, regular rhythm. Good peripheral circulation. Grossly normal heart sounds.   Respiratory: Normal respiratory effort.  No retractions. Lungs CTAB. Gastrointestinal: Soft and nontender. No distention.  Musculoskeletal: No lower extremity tenderness nor edema. No gross deformities of extremities. Neurologic:  Normal speech and language. No gross focal neurologic deficits are appreciated. Normal CN exam 2-12. No pronator drift. Normal gait.  Skin:  Skin is warm, dry and intact. No rash noted.  ____________________________________________   LABS (all labs ordered are listed, but only abnormal results are displayed)  Labs Reviewed  BASIC METABOLIC PANEL - Abnormal; Notable for the following components:      Result Value   Sodium 133 (*)    Chloride 97 (*)    Glucose, Bld 210 (*)    All other components within normal limits  CBG MONITORING, ED - Abnormal; Notable for the  following components:   Glucose-Capillary 241 (*)    All other components within normal limits  CBG MONITORING, ED - Abnormal; Notable for the following components:   Glucose-Capillary 190 (*)    All other components within normal limits  CBC   ____________________________________________  EKG   EKG Interpretation  Date/Time:  Saturday April 03 2017 13:10:55 EST Ventricular Rate:  71 PR Interval:  128 QRS Duration: 90 QT Interval:  408 QTC Calculation: 443 R Axis:   89 Text Interpretation:  Normal sinus rhythm Right atrial enlargement Nonspecific ST abnormality Abnormal ECG No STEMI. No prior EKG for comparison.  Confirmed by Nanda Quinton [phone]) on 04/03/2017 4:47:42 PM       ____________________________________________   PROCEDURES  Procedure(s) performed:   Procedures  None  ____________________________________________   INITIAL IMPRESSION / ASSESSMENT AND PLAN / ED COURSE  Pertinent labs & imaging results that were available during my care of the patient were reviewed by me and considered in my medical decision making (see chart for details).  Patient presents to the emergency department for evaluation of elevated blood pressures with some lightheadedness.  He is describing some mild vertigo that is not reproducible on my exam.  He has no focal neurological deficits and a normal gait.  No evidence to suspect central etiology for symptoms.  Plan for blood pressure log.  I spent time discussing that the patient should take his blood pressure only once in the morning and once in the evening.  He can record these values ensure that with his primary care physician along with his blood sugars.  Also will give meclizine to take if he develops any lightheadedness/vertigo symptoms.  I discussed that these are persistent or he develops any strokelike symptoms he needs to return to the emergency department immediately.  He verbalized understanding and will call his PCP on  Monday to discuss follow-up plan.   At this time, I do not feel there is any life-threatening condition present. I have reviewed and discussed all results (EKG, imaging, lab, urine as appropriate), exam findings with patient. I have reviewed nursing notes and appropriate previous records.  I feel the patient is safe to be discharged home without further emergent workup. Discussed usual and customary return precautions. Patient and family (if present) verbalize understanding and are comfortable with this plan.  Patient will follow-up with their primary care provider. If they do not have a primary care provider, information for follow-up has been provided to them. All questions have been answered.  ____________________________________________  FINAL CLINICAL IMPRESSION(S) / ED DIAGNOSES  Final diagnoses:  Lightheadedness  Essential hypertension     MEDICATIONS GIVEN DURING THIS VISIT:  Medications - No data to display   NEW OUTPATIENT MEDICATIONS STARTED DURING THIS VISIT:  New Prescriptions   MECLIZINE (ANTIVERT) 12.5 MG TABLET    Take 1 tablet (12.5 mg total) by mouth 3 (three) times daily as needed for dizziness.    Note:  This document was prepared using Dragon voice recognition software and may include unintentional dictation errors.  Nanda Quinton, MD Emergency Medicine  Margette Fast, MD 04/03/17 1710

## 2017-05-03 ENCOUNTER — Encounter (HOSPITAL_COMMUNITY): Payer: Self-pay

## 2017-05-03 ENCOUNTER — Other Ambulatory Visit: Payer: Self-pay

## 2017-05-03 DIAGNOSIS — I10 Essential (primary) hypertension: Secondary | ICD-10-CM | POA: Diagnosis not present

## 2017-05-03 DIAGNOSIS — Z5321 Procedure and treatment not carried out due to patient leaving prior to being seen by health care provider: Secondary | ICD-10-CM | POA: Diagnosis not present

## 2017-05-03 NOTE — ED Triage Notes (Addendum)
Pt endorses headache and eye pain that began 3 hours ago and "usually when that happens my BP is high" No neuro deficits. BP 114/87 in triage. Pt takes prednisone for his RA and states "I didn't take it today" Pt ambulatory. Pt has no sx no and denies pain.

## 2017-05-04 ENCOUNTER — Emergency Department (HOSPITAL_COMMUNITY)
Admission: EM | Admit: 2017-05-04 | Discharge: 2017-05-04 | Disposition: A | Discharge status: Home / Self Care | Payer: Medicaid Other | Attending: Emergency Medicine | Admitting: Emergency Medicine

## 2017-05-04 HISTORY — DX: Rheumatoid arthritis, unspecified: M06.9

## 2017-05-04 NOTE — ED Notes (Signed)
Called for vitals with no answer 

## 2017-05-04 NOTE — ED Notes (Signed)
Called to recheck Pt's vitals. No answer x4

## 2017-05-26 ENCOUNTER — Ambulatory Visit (INDEPENDENT_AMBULATORY_CARE_PROVIDER_SITE_OTHER): Payer: Self-pay | Admitting: Nurse Practitioner

## 2017-07-07 ENCOUNTER — Encounter (HOSPITAL_COMMUNITY): Payer: Self-pay | Admitting: Emergency Medicine

## 2017-07-07 ENCOUNTER — Ambulatory Visit (HOSPITAL_COMMUNITY)
Admission: EM | Admit: 2017-07-07 | Discharge: 2017-07-07 | Disposition: A | Discharge status: Home / Self Care | Payer: Self-pay | Attending: Family Medicine | Admitting: Family Medicine

## 2017-07-07 DIAGNOSIS — E1140 Type 2 diabetes mellitus with diabetic neuropathy, unspecified: Secondary | ICD-10-CM

## 2017-07-07 MED ORDER — AMITRIPTYLINE HCL 25 MG PO TABS: 25.0000 mg | ORAL_TABLET | Freq: Every day | ORAL | 1 refills | Status: DC

## 2017-07-07 NOTE — ED Provider Notes (Signed)
[place]   [id number] 07/07/17 Arrival Time: 1010  ASSESSMENT & PLAN:  1. 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 amitriptyline (ELAVIL) 25 MG tablet    Sig: Take 1 tablet (25 mg total) by mouth at bedtime.    Dispense:  30 tablet    Refill:  1   Has appt with his PCP next week. May f/u here as needed.  Reviewed expectations re: course of current medical issues. Questions answered. Outlined signs and symptoms indicating need for more acute intervention. Patient verbalized understanding. After Visit Summary given.   SUBJECTIVE: History from: patient. Tony Ortega is a 57 y.o. male who presents with complaint of persistent painful neuropathy. Reports gradual onset over the past year. L foot. Has taken gabapentin for quite some time but it continues to make him "dizzy." Takes meclizine to counteract. Current exacerbation of neuropathy over the past few weeks. Hasn't taken gabapentin in a few days secondary to side effects. Ambulatory without assistance. Reports his blood sugars are overall controlled. Would like to try a different medication.  ROS: As per HPI.   OBJECTIVE:  Vitals:   07/07/17 1052  BP: 114/73  Pulse: 68  Resp: 18  Temp: 98.3 F (36.8 C)  SpO2: 98%    General appearance: alert; no distress Extremities: no cyanosis or edema; symmetrical with no gross deformities Skin: warm and dry Neurologic: normal gait; normal symmetric reflexes; CN 2-12 intact; does report decreased sensation over LEs bilaterally Psychological: alert and cooperative; normal mood and affect  No Active Allergies  Past Medical History:  Diagnosis Date  . Diabetes mellitus without complication ([place])   . Hypertension   . Rheumatoid arthritis ([place])    Social History   Socioeconomic History  . Marital status: Single    Spouse name: Not on file  . Number of children: Not on file  . Years of education: Not on file  .  Highest education level: Not on file  Occupational History  . Not on file  Social Needs  . Financial resource strain: Not on file  . Food insecurity:    Worry: Not on file    Inability: Not on file  . Transportation needs:    Medical: Not on file    Non-medical: Not on file  Tobacco Use  . Smoking status: Current Some Day Smoker    Packs/day: 1.00    Types: Cigarettes  . Smokeless tobacco: Never Used  Substance and Sexual Activity  . Alcohol use: No  . Drug use: No  . Sexual activity: Not on file  Lifestyle  . Physical activity:    Days per week: Not on file    Minutes per session: Not on file  . Stress: Not on file  Relationships  . Social connections:    Talks on phone: Not on file    Gets together: Not on file    Attends religious service: Not on file    Active member of club or organization: Not on file    Attends meetings of clubs or organizations: Not on file    Relationship status: Not on file  . Intimate partner violence:    Fear of current or ex partner: Not on file    Emotionally abused: Not on file    Physically abused: Not on file    Forced sexual activity: Not on file  Other Topics Concern  . Not on file  Social History Narrative  . Not  on file   No family history on file. Past Surgical History:  Procedure Laterality Date  . KNEE SURGERY       Vanessa Kick, MD 07/22/17 [phone]

## 2017-07-07 NOTE — ED Triage Notes (Signed)
Pt c/o diabetic neuropathy in his L foot.

## 2017-09-03 ENCOUNTER — Encounter (HOSPITAL_COMMUNITY): Payer: Self-pay

## 2017-09-03 ENCOUNTER — Ambulatory Visit (HOSPITAL_COMMUNITY)
Admission: EM | Admit: 2017-09-03 | Discharge: 2017-09-03 | Disposition: A | Discharge status: Home / Self Care | Payer: Self-pay | Attending: Family Medicine | Admitting: Family Medicine

## 2017-09-03 DIAGNOSIS — M79652 Pain in left thigh: Secondary | ICD-10-CM

## 2017-09-03 MED ORDER — IBUPROFEN 800 MG PO TABS: 800.0000 mg | ORAL_TABLET | Freq: Two times a day (BID) | ORAL | 0 refills | Status: DC | PRN

## 2017-09-03 MED ORDER — CYCLOBENZAPRINE HCL 5 MG PO TABS: 5.0000 mg | ORAL_TABLET | Freq: Three times a day (TID) | ORAL | 0 refills | Status: DC | PRN

## 2017-09-03 NOTE — Discharge Instructions (Signed)
Take ibuprofen for pain Take this with food Take muscle relaxer 3 times a day as needed Put ice on the sore muscle for 20 minutes every couple of hours Limit walking Follow-up with your PCP

## 2017-09-03 NOTE — ED Triage Notes (Signed)
Pt presents with complaints of upper thigh left pain since yesterday.

## 2017-09-03 NOTE — ED Provider Notes (Signed)
[place]    CSN: [id number] Arrival date & time: 09/03/17  0737     History   Chief Complaint Chief Complaint  Patient presents with  . Leg Pain    HPI Tony Ortega is a 57 y.o. male.   HPI  This is an insulin-dependent diabetic who states his control is been good.  His blood sugar this morning was 126.  He does have a history of diabetic neuropathy.  He developed leg pain yesterday suddenly.  He had a normal day.  No accident or injury.  No unusual amount of time spent walking, or sitting.  The pain is in his left side only.  It is worse with movement and better with rest.  He does not have pain with weightbearing.  He does not have any history of hip problems.  He has no numbness or weakness in the leg.  Just pain in the leg.  He also noted his blood pressure was elevated this morning.  He did take his blood pressure medicine this morning.  I explained to him that the blood pressure could be elevated because of his pain and aggravation.  Recheck blood pressure was improved.  He is not having any headache, dizziness, or symptoms from the blood pressure.  He has not taken any medicine for the leg pain.  Past Medical History:  Diagnosis Date  . Diabetes mellitus without complication ([place])   . Hypertension   . Rheumatoid arthritis ([place])     There are no active problems to display for this patient.   Past Surgical History:  Procedure Laterality Date  . KNEE SURGERY         Home Medications    Prior to Admission medications   Medication Sig Start Date End Date Taking? Authorizing Provider  acetaminophen (TYLENOL) 500 MG tablet Take 1 tablet (500 mg total) by mouth every 6 (six) hours as needed for mild pain or moderate pain. 08/30/16  Yes Waynetta Pean, PA-C  amitriptyline (ELAVIL) 25 MG tablet Take 1 tablet (25 mg total) by mouth at bedtime. 07/07/17  Yes Vanessa Kick, MD  gabapentin (NEURONTIN) 100 MG capsule Take 1 capsule (100 mg total) by mouth 3 (three)  times daily. Take 1 tablet once a day for 3 days then 1 tablet twice a day for 3 days then take 3 tablets 3 times a day. 08/19/16  Yes Ward, Delice Bison, DO  glucose blood test strip Use as instructed 03/30/17  Yes Burky, Natalie B, NP  hydrochlorothiazide (HYDRODIURIL) 25 MG tablet Take 1 tablet (25 mg total) by mouth daily. 03/30/17  Yes Zigmund Gottron, NP  meclizine (ANTIVERT) 12.5 MG tablet Take 1 tablet (12.5 mg total) by mouth 3 (three) times daily as needed for dizziness. 04/03/17  Yes Long, Wonda Olds, MD  predniSONE (DELTASONE) 5 MG tablet Take 5 mg by mouth daily with breakfast.   Yes [provider]  cyclobenzaprine (FLEXERIL) 5 MG tablet Take 1 tablet (5 mg total) by mouth 3 (three) times daily as needed for muscle spasms. 09/03/17   Raylene Everts, MD  ibuprofen (ADVIL,MOTRIN) 800 MG tablet Take 1 tablet (800 mg total) by mouth 2 (two) times daily as needed for mild pain. 09/03/17   Raylene Everts, MD    Family History No family history on file.  Social History Social History   Tobacco Use  . Smoking status: Current Some Day Smoker    Packs/day: 1.00    Types: Cigarettes  . Smokeless  tobacco: Never Used  Substance Use Topics  . Alcohol use: No  . Drug use: No     Allergies   Patient has no known allergies.   Review of Systems Review of Systems  Constitutional: Negative for chills and fever.  HENT: Negative for ear pain and sore throat.   Eyes: Negative for pain and visual disturbance.  Respiratory: Negative for cough and shortness of breath.   Cardiovascular: Negative for chest pain and palpitations.  Gastrointestinal: Negative for abdominal pain and vomiting.  Genitourinary: Negative for dysuria and hematuria.  Musculoskeletal: Positive for gait problem and myalgias. Negative for arthralgias and back pain.       Left thigh pain  Skin: Negative for color change and rash.  Neurological: Negative for seizures and syncope.  All other systems reviewed and  are negative.    Physical Exam Triage Vital Signs ED Triage Vitals [09/03/17 1015]  Enc Vitals Group     BP (!) 152/101     Pulse Rate 72     Resp 18     Temp 97.9 F (36.6 C)     Temp Source Oral     SpO2 100 %  Repeat blood pressure 150/96 supine left arm No data found.  Updated Vital Signs BP (!) 152/101 (BP Location: Left Arm)   Pulse 72   Temp 97.9 F (36.6 C) (Oral)   Resp 18   SpO2 100%       Physical Exam  Constitutional: He appears well-developed and well-nourished. No distress.  HENT:  Head: Normocephalic and atraumatic.  Mouth/Throat: Oropharynx is clear and moist.  Eyes: Pupils are equal, round, and reactive to light. Conjunctivae are normal.  Neck: Normal range of motion.  Cardiovascular: Normal rate, regular rhythm and normal heart sounds.  Pulmonary/Chest: Effort normal and breath sounds normal. No respiratory distress. He has no wheezes.  Abdominal: Soft. He exhibits no distension.  Musculoskeletal: Normal range of motion. He exhibits no edema.  No tenderness to palpation of the thigh.  Both eyes appear normal.  Acute exacerbation of left thigh pain with straight leg raise in the supine position.  No tenderness or swelling of the groin.  No pain with hip range of motion.  Distal neurovascular is intact.  Neurological: He is alert.  Skin: Skin is warm and dry.  Psychiatric: He has a normal mood and affect. His behavior is normal.     UC Treatments / Results  Labs (all labs ordered are listed, but only abnormal results are displayed) Labs Reviewed - No data to display  EKG None  Radiology No results found.  Procedures Procedures (including critical care time)  Medications Ordered in UC Medications - No data to display  Initial Impression / Assessment and Plan / UC Course  I have reviewed the triage vital signs and the nursing notes.  Pertinent labs & imaging results that were available during my care of the patient were reviewed by me  and considered in my medical decision making (see chart for details).     Muscular leg pain Final Clinical Impressions(s) / UC Diagnoses   Final diagnoses:  Musculoskeletal thigh pain, left     Discharge Instructions     Take ibuprofen for pain Take this with food Take muscle relaxer 3 times a day as needed Put ice on the sore muscle for 20 minutes every couple of hours Limit walking Follow-up with your PCP    ED Prescriptions    Medication Sig Dispense Auth. Provider  ibuprofen (ADVIL,MOTRIN) 800 MG tablet Take 1 tablet (800 mg total) by mouth 2 (two) times daily as needed for mild pain. 30 tablet Raylene Everts, MD   cyclobenzaprine (FLEXERIL) 5 MG tablet Take 1 tablet (5 mg total) by mouth 3 (three) times daily as needed for muscle spasms. 30 tablet Raylene Everts, MD     Controlled Substance Prescriptions [place] Controlled Substance Registry consulted? Not Applicable   Raylene Everts, MD 09/03/17 1134

## 2017-09-05 ENCOUNTER — Emergency Department (HOSPITAL_COMMUNITY)
Admission: EM | Admit: 2017-09-05 | Discharge: 2017-09-05 | Disposition: A | Discharge status: Home / Self Care | Payer: Medicaid Other | Attending: Emergency Medicine | Admitting: Emergency Medicine

## 2017-09-05 ENCOUNTER — Other Ambulatory Visit: Payer: Self-pay

## 2017-09-05 ENCOUNTER — Encounter (HOSPITAL_COMMUNITY): Payer: Self-pay | Admitting: Emergency Medicine

## 2017-09-05 DIAGNOSIS — F17210 Nicotine dependence, cigarettes, uncomplicated: Secondary | ICD-10-CM | POA: Diagnosis not present

## 2017-09-05 DIAGNOSIS — E119 Type 2 diabetes mellitus without complications: Secondary | ICD-10-CM | POA: Diagnosis not present

## 2017-09-05 DIAGNOSIS — Z79899 Other long term (current) drug therapy: Secondary | ICD-10-CM | POA: Insufficient documentation

## 2017-09-05 DIAGNOSIS — I10 Essential (primary) hypertension: Secondary | ICD-10-CM | POA: Diagnosis present

## 2017-09-05 NOTE — ED Triage Notes (Signed)
Pt was here 2 days ago for the same symptoms.

## 2017-09-05 NOTE — ED Provider Notes (Addendum)
[facility] EMERGENCY DEPARTMENT Provider Note   CSN: [id number] Arrival date & time: 09/05/17  1157     History   Chief Complaint Chief Complaint  Patient presents with  . Hypertension  . Headache    HPI Tony Ortega is a 57 y.o. male.  57 year old male presents with concern for elevated blood pressure.  Patient states that he was incarcerated for 30+ years and recently released.  Patient is taking his blood pressure medication every morning as prescribed, walks to the [facility] and checks his blood pressure at least once daily.  Patient states that when he checked his blood pressure today it was 140s over 100s and this concerned him so he came to the emergency room.  Patient states that at times he will notice that his eyes hurt or he has discomfort in his chest and he relates this to his blood pressure being elevated.  He denies having any of these complaints today.  Patient states that he has been monitoring his blood pressure while lying in the emergency room today and states that the numbers go up and go down this is very concerning to him.  No other complaints or concerns today.     Past Medical History:  Diagnosis Date  . Diabetes mellitus without complication ([place])   . Hypertension   . Rheumatoid arthritis ([place])     There are no active problems to display for this patient.   Past Surgical History:  Procedure Laterality Date  . KNEE SURGERY          Home Medications    Prior to Admission medications   Medication Sig Start Date End Date Taking? Authorizing Provider  acetaminophen (TYLENOL) 500 MG tablet Take 1 tablet (500 mg total) by mouth every 6 (six) hours as needed for mild pain or moderate pain. 08/30/16  Yes Waynetta Pean, PA-C  amitriptyline (ELAVIL) 25 MG tablet Take 1 tablet (25 mg total) by mouth at bedtime. 07/07/17  Yes Hagler, Aaron Edelman, MD  amLODipine (NORVASC) 5 MG tablet Take 5 mg by mouth daily.   Yes [provider]    cyclobenzaprine (FLEXERIL) 5 MG tablet Take 1 tablet (5 mg total) by mouth 3 (three) times daily as needed for muscle spasms. 09/03/17  Yes Raylene Everts, MD  gabapentin (NEURONTIN) 100 MG capsule Take 1 capsule (100 mg total) by mouth 3 (three) times daily. Take 1 tablet once a day for 3 days then 1 tablet twice a day for 3 days then take 3 tablets 3 times a day. 08/19/16  Yes Ward, Delice Bison, DO  hydrochlorothiazide (HYDRODIURIL) 25 MG tablet Take 1 tablet (25 mg total) by mouth daily. 03/30/17  Yes Burky, Lanelle Bal B, NP  ibuprofen (ADVIL,MOTRIN) 800 MG tablet Take 1 tablet (800 mg total) by mouth 2 (two) times daily as needed for mild pain. 09/03/17  Yes Raylene Everts, MD  meclizine (ANTIVERT) 12.5 MG tablet Take 1 tablet (12.5 mg total) by mouth 3 (three) times daily as needed for dizziness. 04/03/17  Yes Long, Wonda Olds, MD  predniSONE (DELTASONE) 5 MG tablet Take 5 mg by mouth daily with breakfast.   Yes [provider]  Tetrahydrozoline HCl (VISINE OP) Apply 1 drop to eye daily as needed (redness).    [provider]    Family History No family history on file.  Social History Social History   Tobacco Use  . Smoking status: Current Some Day Smoker    Packs/day: 1.00  Types: Cigarettes  . Smokeless tobacco: Never Used  Substance Use Topics  . Alcohol use: No  . Drug use: No     Allergies   Patient has no known allergies.   Review of Systems Review of Systems  Constitutional: Negative for fever.  Eyes: Negative for visual disturbance.  Respiratory: Negative for shortness of breath.   Cardiovascular: Negative for chest pain.  Gastrointestinal: Negative for abdominal pain, nausea and vomiting.  Musculoskeletal: Negative for arthralgias and myalgias.  Skin: Negative for rash.  Allergic/Immunologic: Negative for immunocompromised state.  Neurological: Negative for dizziness, weakness and headaches.  Psychiatric/Behavioral: Negative for confusion.   All other systems reviewed and are negative.    Physical Exam Updated Vital Signs BP 129/88   Pulse (!) 53   Temp 97.9 F (36.6 C) (Oral)   Resp 14   Ht 5\' 6"  (1.676 m)   Wt 56.7 kg (125 lb)   SpO2 99%   BMI 20.18 kg/m   Physical Exam  Constitutional: He is oriented to person, place, and time. He appears well-developed and well-nourished. No distress.  HENT:  Head: Normocephalic and atraumatic.  Eyes: Pupils are equal, round, and reactive to light.  Cardiovascular: Normal rate, normal heart sounds and intact distal pulses.  No murmur heard. Pulmonary/Chest: Effort normal and breath sounds normal.  Neurological: He is alert and oriented to person, place, and time. He has normal strength. GCS eye subscore is 4. GCS verbal subscore is 5. GCS motor subscore is 6.  Skin: Skin is warm and dry. He is not diaphoretic.  Psychiatric: He has a normal mood and affect. His behavior is normal.  Nursing note and vitals reviewed.    ED Treatments / Results  Labs (all labs ordered are listed, but only abnormal results are displayed) Labs Reviewed - No data to display  EKG None  Radiology No results found.  Procedures Procedures (including critical care time)  Medications Ordered in ED Medications - No data to display   Initial Impression / Assessment and Plan / ED Course  I have reviewed the triage vital signs and the nursing notes.  Pertinent labs & imaging results that were available during my care of the patient were reviewed by me and considered in my medical decision making (see chart for details).  Clinical Course as of Sep 05 2049  Sun Jul 28, 633  1051 57 year old male taking medication for elevated blood pressure presents for concerns of fluctuating blood pressure.  Patient denies any complaints or symptoms otherwise today.  Upon arrival in the emergency room patient's blood pressure was 142/98, and is currently 137/90.  Patient was recently incarcerated and  released, does not have established care however has filled his blood pressure medication through the health department and does not need medications refilled today.  Recommend patient monitor his blood pressure, record readings and take to PCP follow-up.  Advised him to return to the emergency room if he were to become symptomatic.   [LM]    Clinical Course User Index [LM] Tacy Learn, PA-C     Final Clinical Impressions(s) / ED Diagnoses   Final diagnoses:  Hypertension, unspecified type    ED Discharge Orders    None       Tacy Learn, PA-C 09/05/17 1445    Percell Miller Hewitt Shorts, PA-C 09/05/17 2052    Malvin Johns, MD 09/05/17 2212

## 2017-09-05 NOTE — Discharge Instructions (Addendum)
Take your blood pressure medication as prescribed. Keep a journal with the time you took your blood pressure medication and your blood pressure readings.  See instruction sheet for how to check your blood pressure, specifically be sure to sit down for at least 5 minutes prior to checking her blood pressure.  Changes in your diet or exercise routine could be affecting your blood pressure.  Follow-up with a primary care provider, referrals given.

## 2017-09-05 NOTE — ED Notes (Signed)
Patient verbalizes understanding of discharge instructions. Opportunity for questioning and answers were provided. Armband removed by staff, pt discharged from ED.  

## 2017-09-05 NOTE — ED Triage Notes (Signed)
Pt. Stated, My BP is running higher than normal. Ive been on my BP since 1998.

## 2017-09-08 ENCOUNTER — Emergency Department (HOSPITAL_COMMUNITY): Payer: Medicaid Other

## 2017-09-08 ENCOUNTER — Emergency Department (HOSPITAL_COMMUNITY)
Admission: EM | Admit: 2017-09-08 | Discharge: 2017-09-08 | Disposition: A | Discharge status: Home / Self Care | Payer: Medicaid Other | Attending: Emergency Medicine | Admitting: Emergency Medicine

## 2017-09-08 ENCOUNTER — Encounter (HOSPITAL_COMMUNITY): Payer: Self-pay

## 2017-09-08 ENCOUNTER — Other Ambulatory Visit: Payer: Self-pay

## 2017-09-08 DIAGNOSIS — I10 Essential (primary) hypertension: Secondary | ICD-10-CM | POA: Insufficient documentation

## 2017-09-08 DIAGNOSIS — Z79899 Other long term (current) drug therapy: Secondary | ICD-10-CM | POA: Insufficient documentation

## 2017-09-08 DIAGNOSIS — Z7982 Long term (current) use of aspirin: Secondary | ICD-10-CM | POA: Insufficient documentation

## 2017-09-08 DIAGNOSIS — R0789 Other chest pain: Secondary | ICD-10-CM | POA: Diagnosis not present

## 2017-09-08 DIAGNOSIS — F17210 Nicotine dependence, cigarettes, uncomplicated: Secondary | ICD-10-CM | POA: Insufficient documentation

## 2017-09-08 DIAGNOSIS — E119 Type 2 diabetes mellitus without complications: Secondary | ICD-10-CM | POA: Diagnosis not present

## 2017-09-08 DIAGNOSIS — R079 Chest pain, unspecified: Secondary | ICD-10-CM | POA: Diagnosis present

## 2017-09-08 LAB — CBC
HCT: 48.2 % (ref 39.0–52.0)
Hemoglobin: 16.1 g/dL (ref 13.0–17.0)
MCH: 30.1 pg (ref 26.0–34.0)
MCHC: 33.4 g/dL (ref 30.0–36.0)
MCV: 90.3 fL (ref 78.0–100.0)
Platelets: 224 10*3/uL (ref 150–400)
RBC: 5.34 MIL/uL (ref 4.22–5.81)
RDW: 12.2 % (ref 11.5–15.5)
WBC: 9.7 10*3/uL (ref 4.0–10.5)

## 2017-09-08 LAB — BASIC METABOLIC PANEL
Anion gap: 13 (ref 5–15)
BUN: 10 mg/dL (ref 6–20)
CO2: 29 mmol/L (ref 22–32)
Calcium: 9.7 mg/dL (ref 8.9–10.3)
Chloride: 95 mmol/L — ABNORMAL LOW (ref 98–111)
Creatinine, Ser: 0.91 mg/dL (ref 0.61–1.24)
GFR calc Af Amer: >60 mL/min (ref >60)
GFR calc non Af Amer: >60 mL/min (ref >60)
Glucose, Bld: 106 mg/dL — ABNORMAL HIGH (ref 70–99)
Potassium: 4 mmol/L (ref 3.5–5.1)
Sodium: 137 mmol/L (ref 135–145)

## 2017-09-08 LAB — I-STAT TROPONIN, ED
Troponin i, poc: 0 ng/mL (ref 0.00–0.08)
Troponin i, poc: 0 ng/mL (ref 0.00–0.08)

## 2017-09-08 NOTE — ED Triage Notes (Signed)
Pt states he has been to ED for the past 2 days for hypertension and fast heart beat, states he was at work today and had sudden onset of chest pain on the right side "like the muscle was tight and my veins were sticking out of my arm".

## 2017-09-08 NOTE — ED Provider Notes (Signed)
[facility] EMERGENCY DEPARTMENT Provider Note   CSN: [id number] Arrival date & time: 09/08/17  1612   History   Chief Complaint Chief Complaint  Patient presents with  . Chest Pain    HPI Tony Ortega is a 57 y.o. male.  HPI Patient is a 57 year old male with history of hypertension & rheumatoid arthritis who presents to the emergency department today for evaluation of chest pain.  Patient reports that he developed chest pain yesterday while speaking with his parole officer on the phone.  Describes chest pressure.  States that it was worse today when he was in drug rehab counseling.  States that he noticed the veins in his right arm seem to appear engorged.  It resolved after leaving the rehab facility to come to this emergency department for evaluation.  Pain-free at time of evaluation.  Describes pain as pressure and "it feels like I got a good workout".  States that he did not work out recently and no new activities.  Smoke marijuana "whenever I can get it".  Denies any other drugs including cocaine or amphetamines.  Denies tobacco/ETOH.  Denies any other associated symptoms such as nausea, vomiting or diaphoresis.  No leg swelling.  No history of DVT or PE.  Does not take any anticoagulants.  States he did take aspirin prior to coming to the emergency department.  No history of CAD.  No family history of cardiac disease.  Has been seen twice over the past week once for leg pain, and once for asymptomatic hypertension. Reports compliance with his home meds.   Past Medical History:  Diagnosis Date  . Diabetes mellitus without complication ([place])   . Hypertension   . Rheumatoid arthritis ([place])     There are no active problems to display for this patient.   Past Surgical History:  Procedure Laterality Date  . KNEE SURGERY          Home Medications    Prior to Admission medications   Medication Sig Start Date End Date Taking? Authorizing Provider    acetaminophen (TYLENOL) 500 MG tablet Take 1 tablet (500 mg total) by mouth every 6 (six) hours as needed for mild pain or moderate pain. 08/30/16  Yes Waynetta Pean, PA-C  amitriptyline (ELAVIL) 25 MG tablet Take 1 tablet (25 mg total) by mouth at bedtime. Patient taking differently: Take 12.5 mg by mouth at bedtime.  07/07/17  Yes Hagler, Aaron Edelman, MD  amLODipine (NORVASC) 5 MG tablet Take 5 mg by mouth daily.   Yes [provider]  aspirin 81 MG chewable tablet Chew by mouth daily.   Yes [provider]  hydrochlorothiazide (HYDRODIURIL) 25 MG tablet Take 1 tablet (25 mg total) by mouth daily. 03/30/17  Yes Burky, Lanelle Bal B, NP  predniSONE (DELTASONE) 5 MG tablet Take 5 mg by mouth daily with breakfast.   Yes [provider]  Tetrahydrozoline HCl (VISINE OP) Apply 1 drop to eye daily as needed (redness).   Yes [provider]  cyclobenzaprine (FLEXERIL) 5 MG tablet Take 1 tablet (5 mg total) by mouth 3 (three) times daily as needed for muscle spasms. Patient not taking: Reported on 09/08/2017 09/03/17   Raylene Everts, MD  gabapentin (NEURONTIN) 100 MG capsule Take 1 capsule (100 mg total) by mouth 3 (three) times daily. Take 1 tablet once a day for 3 days then 1 tablet twice a day for 3 days then take 3 tablets 3 times a day. Patient not taking: Reported on  09/08/2017 08/19/16   Ward, Delice Bison, DO  ibuprofen (ADVIL,MOTRIN) 800 MG tablet Take 1 tablet (800 mg total) by mouth 2 (two) times daily as needed for mild pain. Patient not taking: Reported on 09/08/2017 09/03/17   Raylene Everts, MD  meclizine (ANTIVERT) 12.5 MG tablet Take 1 tablet (12.5 mg total) by mouth 3 (three) times daily as needed for dizziness. Patient not taking: Reported on 09/08/2017 04/03/17   Long, Wonda Olds, MD    Family History No family history on file.  Social History Social History   Tobacco Use  . Smoking status: Current Some Day Smoker    Packs/day: 1.00    Types: Cigarettes   . Smokeless tobacco: Never Used  Substance Use Topics  . Alcohol use: No  . Drug use: Yes    Types: Marijuana     Allergies   Patient has no known allergies.   Review of Systems Review of Systems  Constitutional: Negative for chills and fever.  HENT: Negative for congestion.   Respiratory: Negative for cough and shortness of breath.   Cardiovascular: Positive for chest pain. Negative for palpitations and leg swelling.  Gastrointestinal: Negative for abdominal pain, diarrhea, nausea and vomiting.  Genitourinary: Negative for dysuria and hematuria.  Musculoskeletal: Negative for back pain and neck pain.  Skin: Negative for rash.  Neurological: Negative for weakness, numbness and headaches.     Physical Exam Updated Vital Signs BP (!) 124/95   Pulse 68   Temp 98.8 F (37.1 C) (Oral)   Resp 16   Ht 5\' 6"  (1.676 m)   Wt 56.7 kg (125 lb)   SpO2 96%   BMI 20.18 kg/m   Physical Exam  Constitutional: No distress.  HENT:  Head: Normocephalic and atraumatic.  Eyes: Conjunctivae are normal. Right eye exhibits no discharge. Left eye exhibits no discharge.  Neck: Normal range of motion. No tracheal deviation present.  Cardiovascular: Regular rhythm, normal heart sounds and intact distal pulses. Exam reveals no gallop and no friction rub.  No murmur heard. Pulses:      Radial pulses are 2+ on the right side, and 2+ on the left side.  Pulmonary/Chest: Effort normal and breath sounds normal. No respiratory distress.  Abdominal: Soft. Bowel sounds are normal. He exhibits no distension. There is no tenderness.  Musculoskeletal: He exhibits no edema or deformity.       Right lower leg: He exhibits no edema.       Left lower leg: He exhibits no edema.  Neurological: He is alert. He exhibits normal muscle tone.  Skin: No rash noted. He is not diaphoretic.  Psychiatric: He has a normal mood and affect.     ED Treatments / Results  Labs (all labs ordered are listed, but only  abnormal results are displayed) Labs Reviewed  BASIC METABOLIC PANEL - Abnormal; Notable for the following components:      Result Value   Chloride 95 (*)    Glucose, Bld 106 (*)    All other components within normal limits  CBC  I-STAT TROPONIN, ED  I-STAT TROPONIN, ED    EKG EKG Interpretation  Date/Time:  Wednesday September 08 2017 16:14:29 EDT Ventricular Rate:  71 PR Interval:  152 QRS Duration: 96 QT Interval:  420 QTC Calculation: 456 R Axis:   88 Text Interpretation:  Normal sinus rhythm Right atrial enlargement No significant change since last tracing Confirmed by Blanchie Dessert [phone]) on 09/08/2017 5:31:44 PM   Radiology Dg Chest 2 View  Result Date: 09/08/2017 CLINICAL DATA:  Acute chest pain for 2 days. EXAM: CHEST - 2 VIEW COMPARISON:  None. FINDINGS: The cardiomediastinal silhouette is unremarkable. There is no evidence of focal airspace disease, pulmonary edema, suspicious pulmonary nodule/mass, pleural effusion, or pneumothorax. No acute bony abnormalities are identified. IMPRESSION: No active cardiopulmonary disease. Electronically Signed   By: Margarette Canada M.D.   On: 09/08/2017 16:39    Procedures Procedures (including critical care time)  Medications Ordered in ED Medications - No data to display   Initial Impression / Assessment and Plan / ED Course  I have reviewed the triage vital signs and the nursing notes.  Pertinent labs & imaging results that were available during my care of the patient were reviewed by me and considered in my medical decision making (see chart for details).    Patient is a 57 year old male with history of diabetes, hypertension, rheumatoid arthritis who presents to the emergency department today for evaluation of chest pain.   ECG reviewed and shows right approximate 70, NSR, normal axis, intervals within normal limits, no acute ST or T wave changes to suggest ischemia.  Does have right atrial enlargement.  When compared to ECG  from February 2019 no significant changes appreciated. HEAR score 3 for ACS.  History and exam not consistent with pulmonary embolus and given no leg swelling, no history of DVT or PE, low risk by Wells criteria no tachycardia, hypoxia, shortness of breath, or tachypnea.  No further work-up required for PE at this time.  CXR obtained that shows no acute cardiac or pulmonary antibody.  No consolidation to suggest pneumonia.  No pneumothorax.  No effusions.  No widening of mediastinum and history/exam not consistent with aortic dissection.  Monitored in the emergency department with resolution of pain.  Second troponin undetectable.  Stable for discharge.  Atypical chest pain.  Possibly stress-induced.  Strict return precautions provided.  Stable at discharge.  Case and plan of care discussed with Dr. Maryan Rued.   Final Clinical Impressions(s) / ED Diagnoses   Final diagnoses:  Atypical chest pain    ED Discharge Orders    None       Corrie Dandy, MD 09/08/17 2029    Blanchie Dessert, MD 09/09/17 [phone]

## 2017-09-08 NOTE — Discharge Instructions (Signed)
Follow up with your primary care doctor for reevaluation. Return if symptoms worsen. Take home meds as previously prescribed.

## 2017-10-12 ENCOUNTER — Emergency Department (HOSPITAL_COMMUNITY)
Admission: EM | Admit: 2017-10-12 | Discharge: 2017-10-13 | Discharge status: Against Medical Advice | Payer: Medicaid Other | Attending: Emergency Medicine | Admitting: Emergency Medicine

## 2017-10-12 ENCOUNTER — Encounter (HOSPITAL_COMMUNITY): Payer: Self-pay | Admitting: Emergency Medicine

## 2017-10-12 ENCOUNTER — Emergency Department (HOSPITAL_COMMUNITY): Payer: Medicaid Other

## 2017-10-12 ENCOUNTER — Other Ambulatory Visit: Payer: Self-pay

## 2017-10-12 DIAGNOSIS — Z5321 Procedure and treatment not carried out due to patient leaving prior to being seen by health care provider: Secondary | ICD-10-CM | POA: Insufficient documentation

## 2017-10-12 DIAGNOSIS — R509 Fever, unspecified: Secondary | ICD-10-CM | POA: Insufficient documentation

## 2017-10-12 LAB — CBC WITH DIFFERENTIAL/PLATELET
Basophils Absolute: 0.2 10*3/uL — ABNORMAL HIGH (ref 0.0–0.1)
Basophils Relative: 2 %
Eosinophils Absolute: 0.8 10*3/uL — ABNORMAL HIGH (ref 0.0–0.7)
Eosinophils Relative: 8 %
HCT: 52.1 % — ABNORMAL HIGH (ref 39.0–52.0)
Hemoglobin: 17.7 g/dL — ABNORMAL HIGH (ref 13.0–17.0)
Lymphocytes Relative: 29 %
Lymphs Abs: 2.8 10*3/uL (ref 0.7–4.0)
MCH: 29.9 pg (ref 26.0–34.0)
MCHC: 34 g/dL (ref 30.0–36.0)
MCV: 88 fL (ref 78.0–100.0)
Monocytes Absolute: 0.9 10*3/uL (ref 0.1–1.0)
Monocytes Relative: 9 %
Neutro Abs: 5 10*3/uL (ref 1.7–7.7)
Neutrophils Relative %: 52 %
Platelets: 185 10*3/uL (ref 150–400)
RBC: 5.92 MIL/uL — ABNORMAL HIGH (ref 4.22–5.81)
RDW: 12.5 % (ref 11.5–15.5)
WBC: 9.7 10*3/uL (ref 4.0–10.5)

## 2017-10-12 LAB — URINALYSIS, ROUTINE W REFLEX MICROSCOPIC
Bacteria, UA: NONE SEEN
Bilirubin Urine: NEGATIVE
Glucose, UA: NEGATIVE mg/dL
Ketones, ur: NEGATIVE mg/dL
Leukocytes, UA: NEGATIVE
Nitrite: NEGATIVE
Protein, ur: 100 mg/dL — AB
Specific Gravity, Urine: 1.018 (ref 1.005–1.030)
pH: 5 (ref 5.0–8.0)

## 2017-10-12 LAB — COMPREHENSIVE METABOLIC PANEL
ALT: 40 U/L (ref 0–44)
AST: 91 U/L — ABNORMAL HIGH (ref 15–41)
Albumin: 3.1 g/dL — ABNORMAL LOW (ref 3.5–5.0)
Alkaline Phosphatase: 87 U/L (ref 38–126)
Anion gap: 14 (ref 5–15)
BUN: 23 mg/dL — ABNORMAL HIGH (ref 6–20)
CO2: 22 mmol/L (ref 22–32)
Calcium: 8.6 mg/dL — ABNORMAL LOW (ref 8.9–10.3)
Chloride: 88 mmol/L — ABNORMAL LOW (ref 98–111)
Creatinine, Ser: 1.17 mg/dL (ref 0.61–1.24)
GFR calc Af Amer: >60 mL/min (ref >60)
GFR calc non Af Amer: >60 mL/min (ref >60)
Glucose, Bld: 154 mg/dL — ABNORMAL HIGH (ref 70–99)
Potassium: 4.6 mmol/L (ref 3.5–5.1)
Sodium: 124 mmol/L — ABNORMAL LOW (ref 135–145)
Total Bilirubin: 1.5 mg/dL — ABNORMAL HIGH (ref 0.3–1.2)
Total Protein: 8.6 g/dL — ABNORMAL HIGH (ref 6.5–8.1)

## 2017-10-12 LAB — I-STAT CG4 LACTIC ACID, ED: Lactic Acid, Venous: 2.15 mmol/L (ref 0.5–1.9)

## 2017-10-12 MED ORDER — ACETAMINOPHEN 325 MG PO TABS
650.0000 mg | ORAL_TABLET | Freq: Once | ORAL | Status: DC | PRN
  Filled 2017-10-12: qty 2

## 2017-10-12 MED ORDER — ONDANSETRON 4 MG PO TBDP
4.0000 mg | ORAL_TABLET | Freq: Once | ORAL | Status: AC | PRN
  Administered 2017-10-12: 4 mg via ORAL
  Filled 2017-10-12: qty 1

## 2017-10-12 NOTE — ED Notes (Signed)
Called pt for vitals x3, no response.

## 2017-10-12 NOTE — ED Triage Notes (Signed)
Pt reports fever, n/v/d, fever, abdominal pain X7 days.  Pt reports two falls trying to get to the bathroom and generalized weakness.

## 2017-10-13 NOTE — ED Notes (Signed)
Called pt x2 for vitals, no response. °

## 2017-11-16 ENCOUNTER — Encounter (HOSPITAL_COMMUNITY): Payer: Self-pay | Admitting: Emergency Medicine

## 2017-11-16 ENCOUNTER — Ambulatory Visit (HOSPITAL_COMMUNITY)
Admission: EM | Admit: 2017-11-16 | Discharge: 2017-11-16 | Disposition: A | Discharge status: Home / Self Care | Payer: Medicaid Other | Attending: Family Medicine | Admitting: Family Medicine

## 2017-11-16 DIAGNOSIS — E1142 Type 2 diabetes mellitus with diabetic polyneuropathy: Secondary | ICD-10-CM

## 2017-11-16 NOTE — Discharge Instructions (Signed)
Increase gabapentin from 100 mg 2x daily to 100 mg 3x daily.  Recommend follow up with PCP for further evaluation and management.  Patient may benefit from referred to neurology Return or go to the ED if you have any new or worsening symptoms

## 2017-11-16 NOTE — ED Provider Notes (Signed)
[place]   [id number] 11/16/17 Arrival Time: 1100  CC: Bilateral foot pain  SUBJECTIVE: History from: patient. Tony Ortega is a 57 y.o. male complains of bilateral foot pain that he attributes to his hx of neuropathy secondary to his DM.  Reports symptoms for the past couple of months.  States blood sugars not well controlled.  Ranges from 70-250.  140 this AM.   Describes the pain as constant and sharp in character.  Currently being treated by PCP with gabapentin and amitriptyline with minimal relief.  Symptoms are made worse with walking.  Reports similar symptoms in the past.  Denies fever, chills, nausea, vomiting, chest pain, lightheadedness, dizziness, polydipsia, polyuria, and weight loss.    ROS: As per HPI.  Past Medical History:  Diagnosis Date  . Diabetes mellitus without complication ([place])   . Hypertension   . Rheumatoid arthritis [facility])    Past Surgical History:  Procedure Laterality Date  . KNEE SURGERY     No Known Allergies No current facility-administered medications on file prior to encounter.    Current Outpatient Medications on File Prior to Encounter  Medication Sig Dispense Refill  . acetaminophen (TYLENOL) 500 MG tablet Take 1 tablet (500 mg total) by mouth every 6 (six) hours as needed for mild pain or moderate pain. 30 tablet 0  . amitriptyline (ELAVIL) 25 MG tablet Take 1 tablet (25 mg total) by mouth at bedtime. (Patient taking differently: Take 12.5 mg by mouth at bedtime. ) 30 tablet 1  . amLODipine (NORVASC) 5 MG tablet Take 5 mg by mouth daily.    Marland Kitchen aspirin 81 MG chewable tablet Chew by mouth daily.    . hydrochlorothiazide (HYDRODIURIL) 25 MG tablet Take 1 tablet (25 mg total) by mouth daily. 30 tablet 0  . predniSONE (DELTASONE) 5 MG tablet Take 5 mg by mouth daily with breakfast.    . Tetrahydrozoline HCl (VISINE OP) Apply 1 drop to eye daily as needed (redness).     Social History   Socioeconomic History  . Marital status:  Married    Spouse name: Not on file  . Number of children: Not on file  . Years of education: Not on file  . Highest education level: Not on file  Occupational History  . Not on file  Social Needs  . Financial resource strain: Not on file  . Food insecurity:    Worry: Not on file    Inability: Not on file  . Transportation needs:    Medical: Not on file    Non-medical: Not on file  Tobacco Use  . Smoking status: Current Some Day Smoker    Packs/day: 1.00    Types: Cigarettes  . Smokeless tobacco: Never Used  Substance and Sexual Activity  . Alcohol use: No  . Drug use: Yes    Types: Marijuana  . Sexual activity: Not on file  Lifestyle  . Physical activity:    Days per week: Not on file    Minutes per session: Not on file  . Stress: Not on file  Relationships  . Social connections:    Talks on phone: Not on file    Gets together: Not on file    Attends religious service: Not on file    Active member of club or organization: Not on file    Attends meetings of clubs or organizations: Not on file    Relationship status: Not on file  . Intimate partner violence:    Fear of current  or ex partner: Not on file    Emotionally abused: Not on file    Physically abused: Not on file    Forced sexual activity: Not on file  Other Topics Concern  . Not on file  Social History Narrative  . Not on file   History reviewed. No pertinent family history.  OBJECTIVE:  Vitals:   11/16/17 1148  BP: (!) 154/102  Pulse: 62  Resp: 18  Temp: 98.4 F (36.9 C)  TempSrc: Oral  SpO2: 98%    General appearance: AOx3; in no acute distress.  Head: NCAT Lungs: CTA bilaterally Heart: RRR.  Clear S1 and S2 without murmur, gallops, or rubs.  Radial pulses 2+ bilaterally. Musculoskeletal: Bilateral feet Inspection: Skin warm, dry, clear and intact without obvious erythema, effusion, or ecchymosis. No obvious calluses  Palpation: nontender to palpation Sensation: decreased sensation to  plantar aspect of bilateral feet, but intact proprioception intact ROM: FROM active and passive Strength: 5/5 dorsiflexion, 5/5 plantar flexion Skin: warm and dry Neurologic: Ambulates without difficulty; Sensation intact about the lower extremities Psychological: alert and cooperative; normal mood and affect  ASSESSMENT & PLAN:  1. Type 2 diabetes mellitus with peripheral neuropathy (HCC)    No orders of the defined types were placed in this encounter.  Increase gabapentin from 100 mg 2x daily to 100 mg 3x daily.  Recommend follow up with PCP for further evaluation and management.  Patient may benefit from referred to neurology Return or go to the ED if you have any new or worsening symptoms   Reviewed expectations re: course of current medical issues. Questions answered. Outlined signs and symptoms indicating need for more acute intervention. Patient verbalized understanding. After Visit Summary given.    Lestine Box, PA-C 11/16/17 1528

## 2017-11-16 NOTE — ED Triage Notes (Signed)
Pt here for bilateral leg and foot pain from neuropathy

## 2017-12-06 ENCOUNTER — Ambulatory Visit: Payer: Medicaid Other | Attending: Family Medicine | Admitting: Family Medicine

## 2017-12-06 ENCOUNTER — Encounter: Payer: Self-pay | Admitting: Family Medicine

## 2017-12-06 ENCOUNTER — Other Ambulatory Visit: Payer: Self-pay

## 2017-12-06 VITALS — BP 128/82 | HR 64 | Temp 97.9°F | Resp 18 | Ht 66.0 in | Wt 132.2 lb

## 2017-12-06 DIAGNOSIS — E871 Hypo-osmolality and hyponatremia: Secondary | ICD-10-CM | POA: Diagnosis not present

## 2017-12-06 DIAGNOSIS — E1142 Type 2 diabetes mellitus with diabetic polyneuropathy: Secondary | ICD-10-CM

## 2017-12-06 DIAGNOSIS — Z79899 Other long term (current) drug therapy: Secondary | ICD-10-CM

## 2017-12-06 DIAGNOSIS — R269 Unspecified abnormalities of gait and mobility: Secondary | ICD-10-CM

## 2017-12-06 DIAGNOSIS — E1165 Type 2 diabetes mellitus with hyperglycemia: Secondary | ICD-10-CM | POA: Diagnosis not present

## 2017-12-06 DIAGNOSIS — K089 Disorder of teeth and supporting structures, unspecified: Secondary | ICD-10-CM | POA: Diagnosis not present

## 2017-12-06 DIAGNOSIS — M069 Rheumatoid arthritis, unspecified: Secondary | ICD-10-CM | POA: Diagnosis not present

## 2017-12-06 DIAGNOSIS — I10 Essential (primary) hypertension: Secondary | ICD-10-CM

## 2017-12-06 DIAGNOSIS — G8929 Other chronic pain: Secondary | ICD-10-CM | POA: Diagnosis not present

## 2017-12-06 DIAGNOSIS — E782 Mixed hyperlipidemia: Secondary | ICD-10-CM | POA: Diagnosis not present

## 2017-12-06 DIAGNOSIS — Z7952 Long term (current) use of systemic steroids: Secondary | ICD-10-CM | POA: Insufficient documentation

## 2017-12-06 DIAGNOSIS — F17210 Nicotine dependence, cigarettes, uncomplicated: Secondary | ICD-10-CM | POA: Insufficient documentation

## 2017-12-06 DIAGNOSIS — Z794 Long term (current) use of insulin: Secondary | ICD-10-CM | POA: Insufficient documentation

## 2017-12-06 DIAGNOSIS — M7989 Other specified soft tissue disorders: Secondary | ICD-10-CM | POA: Diagnosis not present

## 2017-12-06 MED ORDER — AMLODIPINE BESYLATE 5 MG PO TABS: 5.0000 mg | ORAL_TABLET | Freq: Every day | ORAL | 6 refills | Status: DC
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
## 2017-12-07 LAB — CBC WITH DIFFERENTIAL/PLATELET
Basophils Absolute: 0.1 x10E3/uL (ref 0.0–0.2)
Basos: 1 %
EOS (ABSOLUTE): 0.3 x10E3/uL (ref 0.0–0.4)
Eos: 5 %
Hematocrit: 43.3 % (ref 37.5–51.0)
Hemoglobin: 15 g/dL (ref 13.0–17.7)
Immature Grans (Abs): 0 x10E3/uL (ref 0.0–0.1)
Immature Granulocytes: 0 %
Lymphocytes Absolute: 2.6 x10E3/uL (ref 0.7–3.1)
Lymphs: 39 %
MCH: 30.1 pg (ref 26.6–33.0)
MCHC: 34.6 g/dL (ref 31.5–35.7)
MCV: 87 fL (ref 79–97)
Monocytes Absolute: 0.7 x10E3/uL (ref 0.1–0.9)
Monocytes: 10 %
Neutrophils Absolute: 3.1 x10E3/uL (ref 1.4–7.0)
Neutrophils: 45 %
Platelets: 219 x10E3/uL (ref 150–450)
RBC: 4.98 x10E6/uL (ref 4.14–5.80)
RDW: 12.9 % (ref 12.3–15.4)
WBC: 6.8 x10E3/uL (ref 3.4–10.8)

## 2017-12-07 LAB — COMPREHENSIVE METABOLIC PANEL WITH GFR
ALT: 25 IU/L (ref 0–44)
AST: 44 IU/L — ABNORMAL HIGH (ref 0–40)
Albumin/Globulin Ratio: 1.3 (ref 1.2–2.2)
Albumin: 4 g/dL (ref 3.5–5.5)
Alkaline Phosphatase: 78 IU/L (ref 39–117)
BUN/Creatinine Ratio: 9 (ref 9–20)
BUN: 8 mg/dL (ref 6–24)
Bilirubin Total: 0.5 mg/dL (ref 0.0–1.2)
CO2: 23 mmol/L (ref 20–29)
Calcium: 9.1 mg/dL (ref 8.7–10.2)
Chloride: 100 mmol/L (ref 96–106)
Creatinine, Ser: 0.89 mg/dL (ref 0.76–1.27)
GFR calc Af Amer: 108 mL/min/1.73
GFR calc non Af Amer: 94 mL/min/1.73
Globulin, Total: 3.2 g/dL (ref 1.5–4.5)
Glucose: 132 mg/dL — ABNORMAL HIGH (ref 65–99)
Potassium: 3.5 mmol/L (ref 3.5–5.2)
Sodium: 137 mmol/L (ref 134–144)
Total Protein: 7.2 g/dL (ref 6.0–8.5)

## 2017-12-07 LAB — VITAMIN B12: Vitamin B-12: 706 pg/mL (ref 232–1245)

## 2017-12-07 LAB — SEDIMENTATION RATE: Sed Rate: 27 mm/h (ref 0–30)
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
## 2017-12-13 ENCOUNTER — Telehealth: Payer: Self-pay

## 2017-12-13 NOTE — Telephone Encounter (Signed)
Patient was called, no answer, lvm to return call regarding results.

## 2017-12-13 NOTE — Telephone Encounter (Signed)
-----   Message from [name], MD sent at 12/07/2017  5:49 PM EDT ----- Please notify patient that his complete metabolic panel showed a blood sugar of 132 and patient with a very mild increase in 1 of the liver enzymes, AST which is 44 with normal being 0-40 and this is improved from a level of 91 on 10/12/2017.  Patient with a normal complete blood count.  Patient with a normal sedimentation rate-this test is a nonspecific marker of inflammation.Patient with a normal vitamin B12 level

## 2017-12-15 ENCOUNTER — Ambulatory Visit (INDEPENDENT_AMBULATORY_CARE_PROVIDER_SITE_OTHER): Payer: Medicaid Other | Admitting: Podiatry

## 2017-12-15 ENCOUNTER — Ambulatory Visit (INDEPENDENT_AMBULATORY_CARE_PROVIDER_SITE_OTHER): Payer: Medicaid Other | Admitting: Physician Assistant

## 2017-12-15 DIAGNOSIS — E1142 Type 2 diabetes mellitus with diabetic polyneuropathy: Secondary | ICD-10-CM

## 2017-12-16 ENCOUNTER — Encounter: Payer: Self-pay | Admitting: Podiatry

## 2017-12-16 ENCOUNTER — Ambulatory Visit: Payer: Medicaid Other | Admitting: Podiatry

## 2017-12-16 VITALS — BP 144/86

## 2017-12-16 DIAGNOSIS — B351 Tinea unguium: Secondary | ICD-10-CM

## 2017-12-16 DIAGNOSIS — B353 Tinea pedis: Secondary | ICD-10-CM

## 2017-12-16 DIAGNOSIS — E1142 Type 2 diabetes mellitus with diabetic polyneuropathy: Secondary | ICD-10-CM

## 2017-12-16 DIAGNOSIS — M79675 Pain in left toe(s): Secondary | ICD-10-CM

## 2017-12-16 DIAGNOSIS — M79674 Pain in right toe(s): Secondary | ICD-10-CM | POA: Diagnosis not present

## 2017-12-16 MED ORDER — CLOTRIMAZOLE 1 % EX CREA: TOPICAL_CREAM | CUTANEOUS | 1 refills | Status: DC

## 2017-12-16 NOTE — Patient Instructions (Addendum)
Athlete's Foot Athlete's foot (tinea pedis) is a fungal infection of the skin on the feet. It often occurs on the skin that is between or underneath the toes. It can also occur on the soles of the feet. The infection can spread from person to person (is contagious). What are the causes? Athlete's foot is caused by a fungus. This fungus grows in warm, moist places. Most people get athlete's foot by sharing shower stalls, towels, and wet floors with someone who is infected. Not washing your feet or changing your socks often enough can contribute to athlete's foot. What increases the risk? This condition is more likely to develop in:  Men.  People who have a weak body defense system (immune system).  People who have diabetes.  People who use public showers, such as at a gym.  People who wear heavy-duty shoes, such as Environmental manager.  Seasons with warm, humid weather.  What are the signs or symptoms? Symptoms of this condition include:  Itchy areas between the toes or on the soles of the feet.  White, flaky, or scaly areas between the toes or on the soles of the feet.  Very itchy small blisters between the toes or on the soles of the feet.  Small cuts on the skin. These cuts can become infected.  Thick or discolored toenails.  How is this diagnosed? This condition is diagnosed with a medical history and physical exam. Your health care provider may also take a skin or toenail sample to be examined. How is this treated? Treatment for this condition includes antifungal medicines. These may be applied as powders, ointments, or creams. In severe cases, an oral antifungal medicine may be given. Follow these instructions at home:  Apply or take over-the-counter and prescription medicines only as told by your health care provider.  Keep all follow-up visits as told by your health care provider. This is important.  Do not scratch your feet.  Keep your feet dry: ? Wear  cotton or wool socks. Change your socks every day or if they become wet. ? Wear shoes that allow air to circulate, such as sandals or canvas tennis shoes.  Wash and dry your feet: ? Every day or as told by your health care provider. ? After exercising. ? Including the area between your toes.  Do not share towels, nail clippers, or other personal items that touch your feet with others.  If you have diabetes, keep your blood sugar under control. How is this prevented?  Do not share towels.  Wear sandals in wet areas, such as locker rooms and shared showers.  Keep your feet dry: ? Wear cotton or wool socks. Change your socks every day or if they become wet. ? Wear shoes that allow air to circulate, such as sandals or canvas tennis shoes.  Wash and dry your feet after exercising. Pay attention to the area between your toes. Contact a health care provider if:  You have a fever.  You have swelling, soreness, warmth, or redness in your foot.  You are not getting better with treatment.  Your symptoms get worse.  You have new symptoms. This information is not intended to replace advice given to you by your health care provider. Make sure you discuss any questions you have with your health care provider. Document Released: 01/24/2000 Document Revised: 07/04/2015 Document Reviewed: 07/30/2014 Elsevier Interactive Patient Education  2018 [employer].   Diabetic Neuropathy Diabetic neuropathy is a nerve disease or nerve damage that  is caused by diabetes mellitus. About half of all people with diabetes mellitus have some form of nerve damage. Nerve damage is more common in those who have had diabetes mellitus for many years and who generally have not had good control of their blood sugar (glucose) level. Diabetic neuropathy is a common complication of diabetes mellitus. There are three common types of diabetic neuropathy and a fourth type that is less common and less  understood:  Peripheral neuropathy-This is the most common type of diabetic neuropathy. It causes damage to the nerves of the feet and legs first and then eventually the hands and arms. The damage affects the ability to sense touch.  Autonomic neuropathy-This type causes damage to the autonomic nervous system, which controls the following functions: ? Heartbeat. ? Body temperature. ? Blood pressure. ? Urination. ? Digestion. ? Sweating. ? Sexual function.  Focal neuropathy-Focal neuropathy can be painful and unpredictable and occurs most often in older adults with diabetes mellitus. It involves a specific nerve or one area and often comes on suddenly. It usually does not cause long-term problems.  Radiculoplexus neuropathy- Sometimes called lumbosacral radiculoplexus neuropathy, radiculoplexus neuropathy affects the nerves of the thighs, hips, buttocks, or legs. It is more common in people with type 2 diabetes mellitus and in older men. It is characterized by debilitating pain, weakness, and atrophy, usually in the thigh muscles.  What are the causes? The cause of peripheral, autonomic, and focal neuropathies is diabetes mellitus that is uncontrolled and high glucose levels. The cause of radiculoplexus neuropathy is unknown. However, it is thought to be caused by inflammation related to uncontrolled glucose levels. What are the signs or symptoms? Peripheral Neuropathy Peripheral neuropathy develops slowly over time. When the nerves of the feet and legs no longer work there may be:  Burning, stabbing, or aching pain in the legs or feet.  Inability to feel pressure or pain in your feet. This can lead to: ? Thick calluses over pressure areas. ? Pressure sores. ? Ulcers.  Foot deformities.  Reduced ability to feel temperature changes.  Muscle weakness.  Autonomic Neuropathy The symptoms of autonomic neuropathy vary depending on which nerves are affected. Symptoms may  include:  Problems with digestion, such as: ? Feeling sick to your stomach (nausea). ? Vomiting. ? Bloating. ? Constipation. ? Diarrhea. ? Abdominal pain.  Difficulty with urination. This occurs if you lose your ability to sense when your bladder is full. Problems include: ? Urine leakage (incontinence). ? Inability to empty your bladder completely (retention).  Rapid or irregular heartbeat (palpitations).  Blood pressure drops when you stand up (orthostatic hypotension). When you stand up you may feel: ? Dizzy. ? Weak. ? Faint.  In men, inability to attain and maintain an erection.  In women, vaginal dryness and problems with decreased sexual desire and arousal.  Problems with body temperature regulation.  Increased or decreased sweating.  Focal Neuropathy  Abnormal eye movements or abnormal alignment of both eyes.  Weakness in the wrist.  Foot drop. This results in an inability to lift the foot properly and abnormal walking or foot movement.  Paralysis on one side of your face (Bell palsy).  Chest or abdominal pain. Radiculoplexus Neuropathy  Sudden, severe pain in your hip, thigh, or buttocks.  Weakness and wasting of thigh muscles.  Difficulty rising from a seated position.  Abdominal swelling.  Unexplained weight loss (usually more than 10 lb [4.5 kg]). How is this diagnosed? Peripheral Neuropathy Your senses may be tested. Sensory function  testing can be done with:  A light touch using a monofilament.  A vibration with tuning fork.  A sharp sensation with a pin prick.  Other tests that can help diagnose neuropathy are:  Nerve conduction velocity. This test checks the transmission of an electrical current through a nerve.  Electromyography. This shows how muscles respond to electrical signals transmitted by nearby nerves.  Quantitative sensory testing. This is used to assess how your nerves respond to vibrations and changes in  temperature.  Autonomic Neuropathy Diagnosis is often based on reported symptoms. Tell your health care provider if you experience:  Dizziness.  Constipation.  Diarrhea.  Inappropriate urination or inability to urinate.  Inability to get or maintain an erection.  Tests that may be done include:  Electrocardiography or Holter monitor. These are tests that can help show problems with the heart rate or heart rhythm.  An X-ray exam may be done.  Focal Neuropathy Diagnosis is made based on your symptoms and what your health care provider finds during your exam. Other tests may be done. They may include:  Nerve conduction velocities. This checks the transmission of electrical current through a nerve.  Electromyography. This shows how muscles respond to electrical signals transmitted by nearby nerves.  Quantitative sensory testing. This test is used to assess how your nerves respond to vibration and changes in temperature.  Radiculoplexus Neuropathy  Often the first thing is to eliminate any other issue or problems that might be the cause, as there is no standard test for diagnosis.  X-ray exam of your spine and lumbar region.  Spinal tap to rule out cancer.  MRI to rule out other lesions. How is this treated? Once nerve damage occurs, it cannot be reversed. The goal of treatment is to keep the disease or nerve damage from getting worse and affecting more nerve fibers. Controlling your blood glucose level is the key. Most people with radiculoplexus neuropathy see at least a partial improvement over time. You will need to keep your blood glucose and HbA1c levels in the target range determined by your health care provider. Things that help control blood glucose levels include:  Blood glucose monitoring.  Meal planning.  Physical activity.  Diabetes medicine.  Over time, maintaining lower blood glucose levels helps lessen symptoms. Sometimes, prescription pain medicine is  needed. Follow these instructions at home:  Do not smoke.  Keep your blood glucose level in the range that you and your health care provider have determined acceptable for you.  Keep your blood pressure level in the range that you and your health care provider have determined acceptable for you.  Eat a well-balanced diet.  Be physically active every day. Include strength training and balance exercises.  Protect your feet. ? Check your feet every day for sores, cuts, blisters, or signs of infection. ? Wear padded socks and supportive shoes. Use orthotic inserts, if necessary. ? Regularly check the insides of your shoes for worn spots. Make sure there are no rocks or other items inside your shoes before you put them on. Contact a health care provider if:  You have burning, stabbing, or aching pain in the legs or feet.  You are unable to feel pressure or pain in your feet.  You develop problems with digestion such as: ? Nausea. ? Vomiting. ? Bloating. ? Constipation. ? Diarrhea. ? Abdominal pain.  You have difficulty with urination, such as: ? Incontinence. ? Retention.  You have palpitations.  You develop orthostatic hypotension. When you  stand up you may feel: ? Dizzy. ? Weak. ? Faint.  You cannot attain and maintain an erection (in men).  You have vaginal dryness and problems with decreased sexual desire and arousal (in women).  You have severe pain in your thighs, legs, or buttocks.  You have unexplained weight loss. This information is not intended to replace advice given to you by your health care provider. Make sure you discuss any questions you have with your health care provider. Document Released: 04/06/2001 Document Revised: 07/04/2015 Document Reviewed: 07/07/2012 Elsevier Interactive Patient Education  2017 [place]. Diabetes and Foot Care Diabetes may cause you to have problems because of poor blood supply (circulation) to your feet and legs. This  may cause the skin on your feet to become thinner, break easier, and heal more slowly. Your skin may become dry, and the skin may peel and crack. You may also have nerve damage in your legs and feet causing decreased feeling in them. You may not notice minor injuries to your feet that could lead to infections or more serious problems. Taking care of your feet is one of the most important things you can do for yourself. Follow these instructions at home:  Wear shoes at all times, even in the house. Do not go barefoot. Bare feet are easily injured.  Check your feet daily for blisters, cuts, and redness. If you cannot see the bottom of your feet, use a mirror or ask someone for help.  Wash your feet with warm water (do not use hot water) and mild soap. Then pat your feet and the areas between your toes until they are completely dry. Do not soak your feet as this can dry your skin.  Apply a moisturizing lotion or petroleum jelly (that does not contain alcohol and is unscented) to the skin on your feet and to dry, brittle toenails. Do not apply lotion between your toes.  Trim your toenails straight across. Do not dig under them or around the cuticle. File the edges of your nails with an emery board or nail file.  Do not cut corns or calluses or try to remove them with medicine.  Wear clean socks or stockings every day. Make sure they are not too tight. Do not wear knee-high stockings since they may decrease blood flow to your legs.  Wear shoes that fit properly and have enough cushioning. To break in new shoes, wear them for just a few hours a day. This prevents you from injuring your feet. Always look in your shoes before you put them on to be sure there are no objects inside.  Do not cross your legs. This may decrease the blood flow to your feet.  If you find a minor scrape, cut, or break in the skin on your feet, keep it and the skin around it clean and dry. These areas may be cleansed with mild  soap and water. Do not cleanse the area with peroxide, alcohol, or iodine.  When you remove an adhesive bandage, be sure not to damage the skin around it.  If you have a wound, look at it several times a day to make sure it is healing.  Do not use heating pads or hot water bottles. They may burn your skin. If you have lost feeling in your feet or legs, you may not know it is happening until it is too late.  Make sure your health care provider performs a complete foot exam at least annually or more often  if you have foot problems. Report any cuts, sores, or bruises to your health care provider immediately. Contact a health care provider if:  You have an injury that is not healing.  You have cuts or breaks in the skin.  You have an ingrown nail.  You notice redness on your legs or feet.  You feel burning or tingling in your legs or feet.  You have pain or cramps in your legs and feet.  Your legs or feet are numb.  Your feet always feel cold. Get help right away if:  There is increasing redness, swelling, or pain in or around a wound.  There is a red line that goes up your leg.  Pus is coming from a wound.  You develop a fever or as directed by your health care provider.  You notice a bad smell coming from an ulcer or wound. This information is not intended to replace advice given to you by your health care provider. Make sure you discuss any questions you have with your health care provider. Document Released: 01/24/2000 Document Revised: 07/04/2015 Document Reviewed: 07/05/2012 Elsevier Interactive Patient Education  2017 [employer].

## 2017-12-20 ENCOUNTER — Ambulatory Visit: Payer: Medicaid Other | Admitting: Pharmacist

## 2017-12-24 ENCOUNTER — Emergency Department (HOSPITAL_COMMUNITY): Payer: Medicaid Other

## 2017-12-24 ENCOUNTER — Other Ambulatory Visit: Payer: Self-pay

## 2017-12-24 ENCOUNTER — Emergency Department (HOSPITAL_COMMUNITY)
Admission: EM | Admit: 2017-12-24 | Discharge: 2017-12-24 | Disposition: A | Discharge status: Home / Self Care | Payer: Medicaid Other | Attending: Emergency Medicine | Admitting: Emergency Medicine

## 2017-12-24 DIAGNOSIS — E119 Type 2 diabetes mellitus without complications: Secondary | ICD-10-CM | POA: Insufficient documentation

## 2017-12-24 DIAGNOSIS — R0789 Other chest pain: Secondary | ICD-10-CM | POA: Insufficient documentation

## 2017-12-24 DIAGNOSIS — I10 Essential (primary) hypertension: Secondary | ICD-10-CM | POA: Diagnosis not present

## 2017-12-24 DIAGNOSIS — F17210 Nicotine dependence, cigarettes, uncomplicated: Secondary | ICD-10-CM | POA: Diagnosis not present

## 2017-12-24 DIAGNOSIS — Z79899 Other long term (current) drug therapy: Secondary | ICD-10-CM | POA: Diagnosis not present

## 2017-12-24 LAB — BASIC METABOLIC PANEL
Anion gap: 10 (ref 5–15)
BUN: 7 mg/dL (ref 6–20)
CO2: 25 mmol/L (ref 22–32)
Calcium: 9.1 mg/dL (ref 8.9–10.3)
Chloride: 102 mmol/L (ref 98–111)
Creatinine, Ser: 0.95 mg/dL (ref 0.61–1.24)
GFR calc Af Amer: >60 mL/min (ref >60)
GFR calc non Af Amer: >60 mL/min (ref >60)
Glucose, Bld: 172 mg/dL — ABNORMAL HIGH (ref 70–99)
Potassium: 3.5 mmol/L (ref 3.5–5.1)
Sodium: 137 mmol/L (ref 135–145)

## 2017-12-24 LAB — CBC
HCT: 50.6 % (ref 39.0–52.0)
Hemoglobin: 16.4 g/dL (ref 13.0–17.0)
MCH: 29.3 pg (ref 26.0–34.0)
MCHC: 32.4 g/dL (ref 30.0–36.0)
MCV: 90.5 fL (ref 80.0–100.0)
Platelets: 229 10*3/uL (ref 150–400)
RBC: 5.59 MIL/uL (ref 4.22–5.81)
RDW: 13.2 % (ref 11.5–15.5)
WBC: 5.8 10*3/uL (ref 4.0–10.5)
nRBC: 0 % (ref 0.0–0.2)

## 2017-12-24 LAB — CBG MONITORING, ED: Glucose-Capillary: 140 mg/dL — ABNORMAL HIGH (ref 70–99)

## 2017-12-24 LAB — I-STAT TROPONIN, ED
Troponin i, poc: 0 ng/mL (ref 0.00–0.08)
Troponin i, poc: 0 ng/mL (ref 0.00–0.08)

## 2017-12-24 NOTE — ED Triage Notes (Signed)
Patient states that CP awoke him this a.m. States he checked his blood sugar and it was low (85). Denies SOB or diaphoresis.

## 2017-12-24 NOTE — ED Provider Notes (Signed)
[facility] EMERGENCY DEPARTMENT Provider Note   CSN: [id number] Arrival date & time: 12/24/17  0524     History   Chief Complaint Chief Complaint  Patient presents with  . Chest Pain    HPI Tony Ortega is a 57 y.o. male with a past medical history of hypertension, diabetes who presents to ED for constant right-sided chest pain for the past 2 to 3 hours.  He describes it as a "wait sitting on my chest" and intermittent sharp pains.  He is unsure if he had a history of similar symptoms in the past.  He did not take any medications to help with his symptoms.  He checked his blood sugar this morning and states that it was read at 85.  Patient states that he is supposed to be insulin-dependent but "I am scared of it so I do not take it."  He also states that he only intermittently takes metformin as needed based on his blood sugar.  He did not take any metformin yesterday as far as he can remember.  He did eat dinner last night.  He denies any shortness of breath, abdominal pain, nausea or vomiting, fevers, URI symptoms, hemoptysis.  Denies pleuritic chest pain.  He reports denies and alcohol, tobacco or other drug use.  HPI  Past Medical History:  Diagnosis Date  . Diabetes mellitus without complication ([place])   . Hypertension   . Rheumatoid arthritis ([place])     There are no active problems to display for this patient.   Past Surgical History:  Procedure Laterality Date  . KNEE SURGERY          Home Medications    Prior to Admission medications   Medication Sig Start Date End Date Taking? Authorizing Provider  acetaminophen (TYLENOL) 500 MG tablet Take 1 tablet (500 mg total) by mouth every 6 (six) hours as needed for mild pain or moderate pain. 08/30/16  Yes Waynetta Pean, PA-C  amitriptyline (ELAVIL) 25 MG tablet Take 1 tablet (25 mg total) by mouth at bedtime. 07/07/17  Yes Hagler, Aaron Edelman, MD  amLODipine (NORVASC) 5 MG tablet Take 1 tablet (5 mg total)  by mouth daily. To lower blood pressure 12/06/17  Yes Fulp, Cammie, MD  atorvastatin (LIPITOR) 20 MG tablet Take 20 mg by mouth daily.   Yes [provider]  hydrochlorothiazide (HYDRODIURIL) 25 MG tablet Take 1 tablet (25 mg total) by mouth daily. 03/30/17  Yes Burky, Lanelle Bal B, NP  metFORMIN (GLUCOPHAGE) 1000 MG tablet Take 1,000 mg by mouth 2 (two) times daily with a meal.   Yes [provider]  predniSONE (DELTASONE) 5 MG tablet Take 5 mg by mouth daily with breakfast.   Yes [provider]  clotrimazole (LOTRIMIN) 1 % cream Apply to both feet and between toes twice daily for 4 weeks Patient not taking: Reported on 12/24/2017 12/16/17   Marzetta Board, DPM    Family History No family history on file.  Social History Social History   Tobacco Use  . Smoking status: Current Some Day Smoker    Packs/day: 0.10    Types: Cigarettes  . Smokeless tobacco: Never Used  Substance Use Topics  . Alcohol use: No  . Drug use: Yes    Types: Marijuana     Allergies   Patient has no known allergies.   Review of Systems Review of Systems  Constitutional: Negative for appetite change, chills and fever.  HENT: Negative for ear pain, rhinorrhea, sneezing and  sore throat.   Eyes: Negative for photophobia and visual disturbance.  Respiratory: Negative for cough, chest tightness, shortness of breath and wheezing.   Cardiovascular: Positive for chest pain. Negative for palpitations.  Gastrointestinal: Negative for abdominal pain, blood in stool, constipation, diarrhea, nausea and vomiting.  Genitourinary: Negative for dysuria, hematuria and urgency.  Musculoskeletal: Negative for myalgias.  Skin: Negative for rash.  Neurological: Negative for dizziness, weakness and light-headedness.     Physical Exam Updated Vital Signs BP 123/78 (BP Location: Right Arm)   Pulse 66   Temp 98 F (36.7 C) (Oral)   Resp 18   Ht 5' 6.5" (1.689 m)   Wt 69 kg   SpO2 100%    BMI 24.18 kg/m   Physical Exam  Constitutional: He appears well-developed and well-nourished. No distress.  HENT:  Head: Normocephalic and atraumatic.  Nose: Nose normal.  Eyes: Conjunctivae and EOM are normal. Left eye exhibits no discharge. No scleral icterus.  Neck: Normal range of motion. Neck supple.  Cardiovascular: Normal rate, regular rhythm, normal heart sounds and intact distal pulses. Exam reveals no gallop and no friction rub.  No murmur heard. Pulmonary/Chest: Effort normal and breath sounds normal. No respiratory distress. He exhibits tenderness.    Abdominal: Soft. Bowel sounds are normal. He exhibits no distension. There is no tenderness. There is no guarding.  Musculoskeletal: Normal range of motion. He exhibits no edema.  No lower extremity edema, erythema or calf tenderness bilaterally.  Neurological: He is alert. He exhibits normal muscle tone. Coordination normal.  Skin: Skin is warm and dry. No rash noted.  Psychiatric: He has a normal mood and affect.  Nursing note and vitals reviewed.    ED Treatments / Results  Labs (all labs ordered are listed, but only abnormal results are displayed) Labs Reviewed  BASIC METABOLIC PANEL - Abnormal; Notable for the following components:      Result Value   Glucose, Bld 172 (*)    All other components within normal limits  CBG MONITORING, ED - Abnormal; Notable for the following components:   Glucose-Capillary 140 (*)    All other components within normal limits  CBC  I-STAT TROPONIN, ED  I-STAT TROPONIN, ED    EKG EKG Interpretation  Date/Time:  Friday December 24 2017 05:33:40 EST Ventricular Rate:  63 PR Interval:    QRS Duration: 89 QT Interval:  444 QTC Calculation: 455 R Axis:   69 Text Interpretation:  Sinus rhythm No significant change since last tracing Confirmed by Wandra Arthurs [phone]) on 12/24/2017 7:21:27 AM   Radiology Dg Chest 2 View  Result Date: 12/24/2017 CLINICAL DATA:  Acute onset  of generalized chest pain. EXAM: CHEST - 2 VIEW COMPARISON:  Chest radiograph performed 10/12/2017 FINDINGS: The lungs are well-aerated and clear. There is no evidence of focal opacification, pleural effusion or pneumothorax. The heart is normal in size; the mediastinal contour is within normal limits. No acute osseous abnormalities are seen. IMPRESSION: No acute cardiopulmonary process seen. Electronically Signed   By: Garald Balding M.D.   On: 12/24/2017 07:03    Procedures Procedures (including critical care time)  Medications Ordered in ED Medications - No data to display   Initial Impression / Assessment and Plan / ED Course  I have reviewed the triage vital signs and the nursing notes.  Pertinent labs & imaging results that were available during my care of the patient were reviewed by me and considered in my medical decision making (see chart for  details).  Clinical Course as of Dec 25 934  Ludwig Clarks Dec 24, 2017  6659 Patient continuing to be in NAD, resting comfortably. Awaiting delta troponin.   [HK]    Clinical Course User Index [HK] Delia Heady, PA-C    57 year old male with past medical history of diabetes who presents to ED for evaluation of constant chest pressure for the past 2 to 3 hours.  Describes it as a "wait sitting on my chest" intermittent sharp pains.  He checked his CBG when symptoms began and was found to be 85.  He is noncompliant with his insulin and does not take it and actually takes his metformin intermittently as well.  No shortness of breath, pleuritic chest pain, abdominal pain, nausea, vomiting, URI symptoms.  On exam chest is reproducible to palpation.  No lower extremity edema, erythema or calf tenderness bilaterally. He is overall well appearing and not diaphoretic. Vital signs within normal limits.  Troponin, CBC, BMP are unremarkable.  CBG normal here. Chest x-ray shows no abnormalities.  EKG shows normal sinus rhythm.  He is low risk by Wells criteria.  He  is low risk by heart score of 3.  Will check delta troponin at 3hrs and reassess.   Delta troponin returned as negative.  Suspect that symptoms are musculoskeletal in nature.  I doubt PE, ACS based on his risk factors, work-up and nature of his symptoms.  Patient is comfortable with discharge home and establishing care with a cardiologist.  I feel this is reasonable based on his risk factors and age.  Will advise him to return to ED for any severe worsening symptoms.  Patient is hemodynamically stable, in NAD, and able to ambulate in the ED. Evaluation does not show pathology that would require ongoing emergent intervention or inpatient treatment. I explained the diagnosis to the patient. Pain has been managed and has no complaints prior to discharge. Patient is comfortable with above plan and is stable for discharge at this time. All questions were answered prior to disposition. Strict return precautions for returning to the ED were discussed. Encouraged follow up with PCP.    Portions of this note were generated with [employer]. Dictation errors may occur despite best attempts at proofreading.   Final Clinical Impressions(s) / ED Diagnoses   Final diagnoses:  Chest wall pain    ED Discharge Orders    None       Delia Heady, PA-C 12/24/17 9357    Mesner, Corene Cornea, MD 12/25/17 [phone]

## 2017-12-24 NOTE — Discharge Instructions (Addendum)
Follow-up with the cardiologist office listed below for further evaluation and to establish care. Return to ED for worsening symptoms, shortness of breath, leg swelling, vomiting or coughing up blood.

## 2017-12-24 NOTE — ED Notes (Signed)
Patient verbalizes understanding of discharge instructions. Opportunity for questioning and answers were provided. Pt discharged from ED. 

## 2018-01-02 ENCOUNTER — Encounter: Payer: Self-pay | Admitting: Podiatry

## 2018-01-02 NOTE — Progress Notes (Signed)
Subjective: Tony Ortega presents today referred by Dr. [name] for diabetic foot examination. He has h/o diabetes with severe neuropathy. painful, discolored, thick toenails which interfere with daily activities. He has had problems with dizziness with gabapentin. Neuropathic pain managed by Elavil.   Past Medical History:  Diagnosis Date  . Diabetes mellitus without complication ([place])   . Hypertension   . Rheumatoid arthritis ([place])    There are no active problems to display for this patient.  Past Surgical History:  Procedure Laterality Date  . KNEE SURGERY     Current Outpatient Medications:  .  acetaminophen (TYLENOL) 500 MG tablet, Take 1 tablet (500 mg total) by mouth every 6 (six) hours as needed for mild pain or moderate pain., Disp: 30 tablet, Rfl: 0 .  amitriptyline (ELAVIL) 25 MG tablet, Take 1 tablet (25 mg total) by mouth at bedtime., Disp: 30 tablet, Rfl: 1 .  amLODipine (NORVASC) 5 MG tablet, Take 1 tablet (5 mg total) by mouth daily. To lower blood pressure, Disp: 30 tablet, Rfl: 6 .  atorvastatin (LIPITOR) 20 MG tablet, Take 20 mg by mouth daily., Disp: , Rfl:  .  hydrochlorothiazide (HYDRODIURIL) 25 MG tablet, Take 1 tablet (25 mg total) by mouth daily., Disp: 30 tablet, Rfl: 0 .  metFORMIN (GLUCOPHAGE) 1000 MG tablet, Take 1,000 mg by mouth 2 (two) times daily with a meal., Disp: , Rfl:  .  predniSONE (DELTASONE) 5 MG tablet, Take 5 mg by mouth daily with breakfast., Disp: , Rfl:  .  clotrimazole (LOTRIMIN) 1 % cream, Apply to both feet and between toes twice daily for 4 weeks (Patient not taking: Reported on 12/24/2017), Disp: 30 g, Rfl: 1   No Known Allergies   Social History   Occupational History  . Not on file  Tobacco Use  . Smoking status: Current Some Day Smoker    Packs/day: 0.10    Types: Cigarettes  . Smokeless tobacco: Never Used  Substance and Sexual Activity  . Alcohol use: No  . Drug use: Yes    Types: Marijuana  . Sexual activity: Not on  file  History reviewed. No pertinent family history.  Objective: Vitals:   12/16/17 1018  BP: (!) 144/86   Vascular Examination: Capillary refill time immediate x 10 digits Dorsalis pedis palpable b/l Posterior tibial pulses palpable b/l Digital hair x 10 digits sparse  Skin temperature gradient WNL b/l  Dermatological Examination: Skin with normal turgor and tone b/l  Toenails 1-5 b/l discolored, thick, dystrophic with subungual debris and pain with palpation to nailbeds due to thickness of nails.  Diffuse scaling noted peripherally and plantarly b/l feet with mild foot odor.  No interdigital macerations.  No blisters, no weeping. No signs of secondary bacterial infection noted.  Musculoskeletal: Muscle strength 5/5 to all LE muscle groups HAV with bunion b/l  Neurological: Sensation decreased b/l with 10 gram monofilament. Vibratory sensation diminished b/l  Assessment: 1. Painful onychomycosis toenails 1-5 b/l  2. Tinea pedis b/l 3. NIDDM with neuropathy  Plan: 1. Discussed diabetic foot care principles. Literature dispensed on today. 2. Toenails 1-5 b/l were debrided in length and girth without iatrogenic bleeding. 3. Discussed tinea pedis and topical treatment options. 4. Patient to continue soft, supportive shoe gear 5. Patient to report any pedal injuries to medical professional immediately. 6. Follow up 3 months. Patient/POA to call should there be a concern in the interim.

## 2018-01-10 ENCOUNTER — Encounter: Payer: Self-pay | Admitting: Family Medicine

## 2018-01-10 ENCOUNTER — Ambulatory Visit: Payer: Medicaid Other | Attending: Family Medicine | Admitting: Family Medicine

## 2018-01-10 VITALS — BP 140/91 | HR 83 | Temp 98.6°F | Resp 16 | Wt 133.0 lb

## 2018-01-10 DIAGNOSIS — R269 Unspecified abnormalities of gait and mobility: Secondary | ICD-10-CM | POA: Diagnosis not present

## 2018-01-10 DIAGNOSIS — Z9889 Other specified postprocedural states: Secondary | ICD-10-CM | POA: Insufficient documentation

## 2018-01-10 DIAGNOSIS — I10 Essential (primary) hypertension: Secondary | ICD-10-CM | POA: Diagnosis not present

## 2018-01-10 DIAGNOSIS — E782 Mixed hyperlipidemia: Secondary | ICD-10-CM | POA: Diagnosis not present

## 2018-01-10 DIAGNOSIS — Z79899 Other long term (current) drug therapy: Secondary | ICD-10-CM

## 2018-01-10 DIAGNOSIS — E1142 Type 2 diabetes mellitus with diabetic polyneuropathy: Secondary | ICD-10-CM

## 2018-01-10 DIAGNOSIS — E1165 Type 2 diabetes mellitus with hyperglycemia: Secondary | ICD-10-CM

## 2018-01-10 DIAGNOSIS — R21 Rash and other nonspecific skin eruption: Secondary | ICD-10-CM | POA: Diagnosis not present

## 2018-01-10 DIAGNOSIS — M6281 Muscle weakness (generalized): Secondary | ICD-10-CM

## 2018-01-10 DIAGNOSIS — M069 Rheumatoid arthritis, unspecified: Secondary | ICD-10-CM

## 2018-01-10 DIAGNOSIS — Z794 Long term (current) use of insulin: Secondary | ICD-10-CM | POA: Diagnosis not present

## 2018-01-10 DIAGNOSIS — F17210 Nicotine dependence, cigarettes, uncomplicated: Secondary | ICD-10-CM | POA: Insufficient documentation

## 2018-01-10 LAB — POCT GLYCOSYLATED HEMOGLOBIN (HGB A1C): HbA1c, POC (prediabetic range): 6 % (ref 5.7–6.4)

## 2018-01-10 LAB — GLUCOSE, POCT (MANUAL RESULT ENTRY): POC Glucose: 167 mg/dL — AB (ref 70–99)

## 2018-01-10 MED ORDER — MUPIROCIN CALCIUM 2 % EX CREA: 1.0000 "application " | TOPICAL_CREAM | Freq: Two times a day (BID) | CUTANEOUS | 0 refills | Status: AC

## 2018-01-10 MED ORDER — MUPIROCIN 2 % EX OINT: TOPICAL_OINTMENT | CUTANEOUS | 0 refills | Status: DC
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
## 2018-01-10 NOTE — Addendum Note (Signed)
Addended by: Kathrene Bongo on: 01/10/2018 04:52 PM   Modules accepted: Orders

## 2018-01-11 LAB — LIPID PANEL
Chol/HDL Ratio: 1.8 ratio (ref 0.0–5.0)
Cholesterol, Total: 172 mg/dL (ref 100–199)
HDL: 93 mg/dL
LDL Calculated: 67 mg/dL (ref 0–99)
Triglycerides: 58 mg/dL (ref 0–149)
VLDL Cholesterol Cal: 12 mg/dL (ref 5–40)

## 2018-01-11 LAB — COMPREHENSIVE METABOLIC PANEL WITH GFR
ALT: 59 IU/L — ABNORMAL HIGH (ref 0–44)
AST: 68 IU/L — ABNORMAL HIGH (ref 0–40)
Albumin/Globulin Ratio: 1.4 (ref 1.2–2.2)
Albumin: 4.5 g/dL (ref 3.5–5.5)
Alkaline Phosphatase: 81 IU/L (ref 39–117)
BUN/Creatinine Ratio: 10 (ref 9–20)
BUN: 9 mg/dL (ref 6–24)
Bilirubin Total: 0.7 mg/dL (ref 0.0–1.2)
CO2: 21 mmol/L (ref 20–29)
Calcium: 9.8 mg/dL (ref 8.7–10.2)
Chloride: 101 mmol/L (ref 96–106)
Creatinine, Ser: 0.86 mg/dL (ref 0.76–1.27)
GFR calc Af Amer: 110 mL/min/1.73
GFR calc non Af Amer: 95 mL/min/1.73
Globulin, Total: 3.2 g/dL (ref 1.5–4.5)
Glucose: 138 mg/dL — ABNORMAL HIGH (ref 65–99)
Potassium: 4.3 mmol/L (ref 3.5–5.2)
Sodium: 138 mmol/L (ref 134–144)
Total Protein: 7.7 g/dL (ref 6.0–8.5)

## 2018-01-11 LAB — CREATININE KINASE MB: CK-MB Index: 1.6 ng/mL (ref 0.0–10.4)

## 2018-01-11 MED FILL — MUPIROCIN 2% OINTMENT: 2 | 7 days supply | Qty: 22 | Fill #0

## 2018-01-14 ENCOUNTER — Telehealth: Payer: Self-pay | Admitting: *Deleted

## 2018-01-14 NOTE — Telephone Encounter (Signed)
-----   Message from [name], MD sent at 01/11/2018  5:18 PM EST ----- Please notify patient that his complete metabolic panel showed a glucose of 138.  Complete metabolic panel was otherwise normal with the exception of an elevation in 2 liver enzymes, AST and ALT which were slightly above normal.  Patient should decrease or stop alcohol use and use of Tylenol.  Patient's lipid panel was normal with bad cholesterol at 67 and patient's LDL/bad cholesterol goal is to be at 70 or less.  CK-MB which was a test done to look for issues with muscle inflammation/myositis was normal/negative.

## 2018-01-14 NOTE — Telephone Encounter (Signed)
Medical Assistant left message on patient's home and cell voicemail. Voicemail states to give a call back to Tyeisha Dinan with [facility] at [phone].  

## 2018-01-17 ENCOUNTER — Telehealth: Payer: Self-pay | Admitting: Family Medicine

## 2018-01-17 NOTE — Telephone Encounter (Signed)
Patient came in and dropped off there handicap sticker paperwork

## 2018-01-18 ENCOUNTER — Encounter: Payer: Self-pay | Admitting: Rehabilitation

## 2018-01-18 ENCOUNTER — Other Ambulatory Visit: Payer: Self-pay

## 2018-01-18 ENCOUNTER — Ambulatory Visit: Payer: Medicaid Other | Attending: Family Medicine | Admitting: Rehabilitation

## 2018-01-18 DIAGNOSIS — R296 Repeated falls: Secondary | ICD-10-CM

## 2018-01-18 DIAGNOSIS — R2689 Other abnormalities of gait and mobility: Secondary | ICD-10-CM | POA: Diagnosis present

## 2018-01-18 DIAGNOSIS — R208 Other disturbances of skin sensation: Secondary | ICD-10-CM | POA: Diagnosis present

## 2018-01-18 DIAGNOSIS — R2681 Unsteadiness on feet: Secondary | ICD-10-CM

## 2018-01-18 NOTE — Therapy (Signed)
[facility] [address] [place], [place], [address] Phone: [phone]   Fax:  [phone]  Physical Therapy Evaluation  Patient Details  Name: Tony Ortega MRN: [id number] Date of Birth: 1960/08/26 Referring Provider (PT): [name], MD   Encounter Date: 01/18/2018  PT End of Session - 01/18/18 1454    Visit Number  1    Number of Visits  4   including evaluation   Date for PT Re-Evaluation  02/08/18    Authorization Type  Medicaid (awaiting approval)    PT Start Time  9833    PT Stop Time  1450    PT Time Calculation (min)  43 min    Equipment Utilized During Treatment  Gait belt    Activity Tolerance  Patient tolerated treatment well    Behavior During Therapy  [facility] for tasks assessed/performed       Past Medical History:  Diagnosis Date  . Diabetes mellitus without complication ([place])   . Hypertension   . Rheumatoid arthritis [facility])     Past Surgical History:  Procedure Laterality Date  . KNEE SURGERY      There were no vitals filed for this visit.   Subjective Assessment - 01/18/18 1409    Subjective  "I'll be walking from the front of the house to the back and its like my legs say "okay I can't go anymore" and they give out on me.  I used to be so active so I've never been in this condition."     Pertinent History  DM II with peripheral neuropathy, RA, HTN     Limitations  Walking    Patient Stated Goals  "For me and you to be able to walk without the cane."     Currently in Pain?  Yes    Pain Score  8     Pain Location  Hand    Pain Orientation  Right;Left    Pain Descriptors / Indicators  Pins and needles;Burning    Pain Type  Chronic pain;Neuropathic pain    Pain Radiating Towards  B hands (R>L) and B feet (R side just foot only, L foot up to above ankle)    Pain Onset  More than a month ago    Pain Frequency  Constant    Aggravating Factors   nothing    Pain Relieving Factors  medication  (gabapentin)         [facility] PT Assessment - 01/18/18 1423      Assessment   Medical Diagnosis  Gait abnormality    Referring Provider (PT)  [name], MD    Onset Date/Surgical Date  --   has had decline over the past 2 1/2 years     Precautions   Precautions  Fall      Restrictions   Weight Bearing Restrictions  No      Balance Screen   Has the patient fallen in the past 6 months  Yes    How many times?  5    Has the patient had a decrease in activity level because of a fear of falling?   Yes    Is the patient reluctant to leave their home because of a fear of falling?   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 Status  Within Functional Limits for tasks assessed      Sensation   Light Touch  Impaired Detail    Light Touch Impaired Details  Impaired RUE;Impaired LUE;Impaired LLE;Impaired RLE   B hands (R>L), B feet (L>R)   Hot/Cold  Appears Intact    Proprioception  Appears Intact      Coordination   Gross Motor Movements are Fluid and Coordinated  Yes    Fine Motor Movements are Fluid and Coordinated  No    Heel Shin Test  R WFL, however unable to perform R over L due to RLE weakness      ROM / Strength   AROM / PROM / Strength  Strength      Strength   Overall Strength  Deficits    Overall Strength Comments  R hip flex  2/5, R knee ext 4/5, R knee flex 3/5, R ankle DF 3+/5.  L hip flex 5/5, L knee ext 5/5, L knee flex 5/5, L ankle DF 4/5      Transfers   Transfers  Sit to Stand;Stand to Sit    Sit to Stand  6: Modified independent (Device/Increase time)    Five time sit to stand comments   31.85 secs without UE support     Stand to Sit  6: Modified independent (Device/Increase time)      Ambulation/Gait   Ambulation/Gait  Yes    Ambulation/Gait Assistance  4: Min guard;4: Min assist    Ambulation/Gait Assistance Details  Pt ambulatory during evaluation with metal walking pole with no rubber tip and duck tape on top for hand support.  Provided pt with hand written referral for [facility] that he plans to take to MD today and get signed to take to [place] for cane today.  Will follow up on this at next visit.      Ambulation Distance (Feet)  80 Feet   45   Assistive device  Other (Comment)   walking pole   Gait Pattern  Step-through pattern;Decreased arm swing - left;Decreased stride length;Decreased stance time - right;Decreased step length - left;Right genu recurvatum;Decreased weight shift to right;Lateral hip instability;Trunk flexed;Wide base of support    Ambulation Surface  Level;Indoor    Gait velocity  1.86 ft/sec with walking stick and min A     Stairs  Yes    Stairs Assistance  4: Min guard    Stair Management Technique  One rail Right;Step to pattern;Forwards    Number of Stairs  4    Height of Stairs  6                Objective measurements completed on examination: See above findings.              PT Education - 01/18/18 1453    Education Details  Education on evaluation results,  POC, goals, Medicaid coverage, how neuropathy impacts balance, paper referral for [facility] to be signed by MD    Person(s) Educated  Patient    Methods  Explanation;Handout    Comprehension  Verbalized understanding       PT Short Term Goals - 01/18/18 1508      PT SHORT TERM GOAL #1    Title  Pt will initiate HEP in order to indicate decreased fall risk and improved functional mobility. (Target Date: Following 3rd visit)    Baseline  dependent     Status  New      PT SHORT TERM GOAL #2   Title  Pt will demonstrate safe technique using SPC vs RW in order to indicate decreased fall risk.     Baseline  pt using metal walking pole without rubber tip      PT SHORT TERM GOAL #3   Title  Will perform BERG in order to formally assess balance/fall risk.  LTG to follow    Baseline  Unable to assess due to time constraint        PT Long Term Goals - 01/18/18 1510      PT LONG TERM GOAL #1   Title  Pt will be independent with on-going HEP in order to indicate decreased fall risk and improved functional mobility.  (Target Date: Following 6th visit)    Baseline  dependent       PT LONG TERM GOAL #2   Title  Pt will improve BERG balance test by 4 points from baseline in order to indicate decreased fall risk.     Baseline  unable to assess due to time constraint       PT LONG TERM GOAL #3   Title  Pt will improve gait speed to >/=2.40 ft/sec w/ LRAD in order to indicate decreased fall risk.     Baseline  1.86 ft/sec with walking pole      PT LONG TERM GOAL #4   Title  Pt will perform 5TSS in </=25 secs without UE support in order to indicate decreased fall risk and improved functional strength.     Baseline  31.85 secs without UE support       PT LONG TERM GOAL #5   Title  Will assess AFO needs and request order as appropriate to decrease fall risk.     Baseline  unable to assess due to time contraint              Plan - 01/18/18 1501    Clinical Impression Statement  Pt presents with long standing poorly controlled DM with B peripheral neuropathy and generalized weakness with increased R>L weakness.  Note history of HTN, and RA along with DM with PN.  Also note that pt was incarcerated for 30 years and was using w/c most of the time he was in jail leading to marked  weakness.  Pt feels as though he may have had a stroke in the past due to R sided weakness but no imaging to confirm.  Pt presents to clinic using metal pole without rubber tip causing marked safety concern and instability.  PT wrote paper referral for [facility] during session as pt reports he will take to MD after our visit to get signed and take to [place].  Upon PT evaluation, note gait speed was 1.86 ft/sec with walking pole and min/guard to min A indicative of fall risk and decreased community ambulator and 5TSS time of 31.85 secs  indicative of high fall risk and decreased functional strength.  Pt will benefit from skilled OP neuro in order to address deficits.      History and Personal Factors relevant to plan of care:  see above    Clinical Presentation  Evolving    Clinical Presentation due to:  see above    Clinical Decision Making  Moderate    Rehab Potential  Good    Clinical Impairments Affecting Rehab Potential  question compliance, possible CVA?    PT Frequency  1x / week   1x/wk for 1 week, 2x/wk for 1 week   PT Duration  Other (comment)   1x/wk for 1 wk and 2x/wk for 1 week   PT Treatment/Interventions  ADLs/Self Care Home Management;Aquatic Therapy;Electrical Stimulation;DME Instruction;Gait training;Stair training;Functional mobility training;Therapeutic activities;Therapeutic exercise;Balance training;Neuromuscular re-education;Patient/family education;Orthotic Fit/Training;Passive range of motion;Dry needling;Vestibular    PT Next Visit Plan  BERG, did he get cane? if so, then gait training with SPC for safety, HEP for balance based on BERG results    Consulted and Agree with Plan of Care  Patient       Patient will benefit from skilled therapeutic intervention in order to improve the following deficits and impairments:  Abnormal gait, Decreased activity tolerance, Decreased balance, Decreased coordination, Decreased endurance, Decreased knowledge of precautions, Decreased  knowledge of use of DME, Decreased mobility, Decreased safety awareness, Decreased strength, Impaired perceived functional ability, Impaired flexibility, Impaired sensation, Impaired UE functional use, Postural dysfunction  Visit Diagnosis: Other disturbances of skin sensation  Unsteadiness on feet  Other abnormalities of gait and mobility  Repeated falls     Problem List There are no active problems to display for this patient.   Cameron Sprang, PT, MPT [facility] [address] [place], [place], [address] Phone: [phone]   Fax:  [phone] 01/18/18, 3:14 PM  Name: Tony Ortega MRN: [id number] Date of Birth: Aug 15, 1960

## 2018-01-21 ENCOUNTER — Ambulatory Visit: Payer: Medicaid Other | Admitting: Physical Therapy

## 2018-01-25 ENCOUNTER — Encounter: Payer: Self-pay | Admitting: Physical Therapy

## 2018-01-25 ENCOUNTER — Ambulatory Visit: Payer: Medicaid Other | Admitting: Physical Therapy

## 2018-01-25 DIAGNOSIS — R208 Other disturbances of skin sensation: Secondary | ICD-10-CM | POA: Diagnosis not present

## 2018-01-25 DIAGNOSIS — R296 Repeated falls: Secondary | ICD-10-CM

## 2018-01-25 DIAGNOSIS — R2681 Unsteadiness on feet: Secondary | ICD-10-CM

## 2018-01-25 DIAGNOSIS — R2689 Other abnormalities of gait and mobility: Secondary | ICD-10-CM

## 2018-01-25 NOTE — Patient Instructions (Signed)
Access Code: [email or web address]  URL: [email or web address]  Date: 01/25/2018  Prepared by: Nita Sells   Exercises  Step Up - 10 reps - 2 sets - 2x daily - 7x weekly  Standing March with Counter Support - 10 reps - 2 sets - 2x daily - 7x weekly  Standing Hip Extension with Counter Support - 10 reps - 2 sets - 2x daily - 7x weekly  Heel rises with counter support - 10 reps - 2 sets - 2x daily - 7x weekly  Toe Raises with Counter Support - 10 reps - 2 sets - 2x daily - 7x weekly  Side Stepping with Counter Support - 10 reps - 2 sets - 2x daily - 7x weekly

## 2018-01-25 NOTE — Therapy (Signed)
[facility] [address] [place], [place], [address] Phone: [phone]   Fax:  [phone]  Physical Therapy Treatment  Patient Details  Name: Tony Ortega MRN: [id number] Date of Birth: 25-Oct-1960 Referring Provider (PT): [name], MD   Encounter Date: 01/25/2018  PT End of Session - 01/25/18 1309    Visit Number  2    Number of Visits  4    Date for PT Re-Evaluation  02/08/18    Authorization Type  Medicaid    PT Start Time  0803    PT Stop Time  0845    PT Time Calculation (min)  42 min    Activity Tolerance  Patient tolerated treatment well    Behavior During Therapy  [facility] for tasks assessed/performed       Past Medical History:  Diagnosis Date  . Diabetes mellitus without complication ([place])   . Hypertension   . Rheumatoid arthritis [facility])     Past Surgical History:  Procedure Laterality Date  . KNEE SURGERY      There were no vitals filed for this visit.  Subjective Assessment - 01/25/18 0807    Subjective  "I've always got pain." Pt reporting that he has frequent falls, especially on uneven surfaces    Pertinent History  DM II with peripheral neuropathy, RA, HTN     Limitations  Walking    Patient Stated Goals  "For me and you to be able to walk without the cane."     Currently in Pain?  Yes    Pain Score  9     Pain Location  Other (Comment)   all over but mainly wrist and hands and leg   Pain Orientation  Right;Left    Pain Descriptors / Indicators  Pins and needles;Throbbing    Pain Type  Chronic pain;Neuropathic pain    Pain Onset  More than a month ago    Pain Frequency  Constant    Aggravating Factors   not taking medication    Pain Relieving Factors  sleeping         [facility] PT Assessment - 01/25/18 0001      Balance   Balance Assessed  Yes      Berg Balance Test   Sit to Stand  Able to stand  independently using hands    Standing Unsupported  Able to stand 2 minutes with  supervision    Sitting with Back Unsupported but Feet Supported on Floor or Stool  Able to sit safely and securely 2 minutes    Stand to Sit  Controls descent by using hands    Transfers  Able to transfer safely, definite need of hands    Standing Unsupported with Eyes Closed  Able to stand 10 seconds with supervision    Standing Ubsupported with Feet Together  Able to place feet together independently and stand for 1 minute with supervision    From Standing, Reach Forward with Outstretched Arm  Can reach forward >12 cm safely (5")    From Standing Position, Pick up Object from Floor  Unable to pick up and needs supervision    From Standing Position, Turn to Look Behind Over each Shoulder  Needs supervision when turning    Turn 360 Degrees  Needs assistance while turning    Standing Unsupported, Alternately Place Feet on Step/Stool  Needs assistance to keep from falling or unable to try    Standing Unsupported, One Foot in Stallings  help to step but can hold 15 seconds    Standing on One Leg  Unable to try or needs assist to prevent fall    Total Score  28                   [facility] Adult PT Treatment/Exercise - 01/25/18 0001      Transfers   Transfers  Sit to Stand;Stand to Sit    Sit to Stand  6: Modified independent (Device/Increase time);With upper extremity assist;From chair/3-in-1    Stand to Sit  6: Modified independent (Device/Increase time)      Exercises   Exercises  Knee/Hip      Knee/Hip Exercises: Aerobic   Other Aerobic  Scifit level 1 x 8 minutes      Performed HEP as written in Pt Instructions with patient during session also.       PT Education - 01/25/18 1309    Education Details  HEP    Person(s) Educated  Patient    Methods  Explanation;Demonstration;Handout    Comprehension  Verbalized understanding;Returned demonstration       PT Short Term Goals - 01/18/18 1508      PT SHORT TERM GOAL #1   Title  Pt will initiate HEP in order to  indicate decreased fall risk and improved functional mobility. (Target Date: Following 3rd visit)    Baseline  dependent     Status  New      PT SHORT TERM GOAL #2   Title  Pt will demonstrate safe technique using SPC vs RW in order to indicate decreased fall risk.     Baseline  pt using metal walking pole without rubber tip      PT SHORT TERM GOAL #3   Title  Will perform BERG in order to formally assess balance/fall risk.  LTG to follow    Baseline  Unable to assess due to time constraint        PT Long Term Goals - 01/18/18 1510      PT LONG TERM GOAL #1   Title  Pt will be independent with on-going HEP in order to indicate decreased fall risk and improved functional mobility.  (Target Date: Following 6th visit)    Baseline  dependent       PT LONG TERM GOAL #2   Title  Pt will improve BERG balance test by 4 points from baseline in order to indicate decreased fall risk.     Baseline  unable to assess due to time constraint       PT LONG TERM GOAL #3   Title  Pt will improve gait speed to >/=2.40 ft/sec w/ LRAD in order to indicate decreased fall risk.     Baseline  1.86 ft/sec with walking pole      PT LONG TERM GOAL #4   Title  Pt will perform 5TSS in </=25 secs without UE support in order to indicate decreased fall risk and improved functional strength.     Baseline  31.85 secs without UE support       PT LONG TERM GOAL #5   Title  Will assess AFO needs and request order as appropriate to decrease fall risk.     Baseline  unable to assess due to time contraint             Plan - 01/25/18 1311    Clinical Impression Statement  Pt with difficulty with balance and SLS activities.  Pt purchased straight  cane and says he feels improvement in gait with SPC.  Pt scored 28/56 on BERG.  Continue PT per poc.    Rehab Potential  Good    Clinical Impairments Affecting Rehab Potential  question compliance, possible CVA?    PT Frequency  --   1x/week for 1 week, 2x/week for 1  week   PT Duration  Other (comment)    PT Treatment/Interventions  ADLs/Self Care Home Management;Aquatic Therapy;Electrical Stimulation;DME Instruction;Gait training;Stair training;Functional mobility training;Therapeutic activities;Therapeutic exercise;Balance training;Neuromuscular re-education;Patient/family education;Orthotic Fit/Training;Passive range of motion;Dry needling;Vestibular    PT Next Visit Plan  Pt needs to reschedule one of his visits this week into next week per his POC.  Will have front desk call to reschedule.  Review HEP. Continue strength and balance.  Compliant surface gait/balance.    Consulted and Agree with Plan of Care  Patient       Patient will benefit from skilled therapeutic intervention in order to improve the following deficits and impairments:  Abnormal gait, Decreased activity tolerance, Decreased balance, Decreased coordination, Decreased endurance, Decreased knowledge of precautions, Decreased knowledge of use of DME, Decreased mobility, Decreased safety awareness, Decreased strength, Impaired perceived functional ability, Impaired flexibility, Impaired sensation, Impaired UE functional use, Postural dysfunction  Visit Diagnosis: Other disturbances of skin sensation  Unsteadiness on feet  Other abnormalities of gait and mobility  Repeated falls     Problem List There are no active problems to display for this patient.  Narda Bonds, [place] 01/25/18 1:23 PM Phone: [phone] Fax: [facility] [address] [place], [place], [address] Phone: [phone]   Fax:  [phone]  Name: Lorenz Donley MRN: [id number] Date of Birth: 1960/06/18

## 2018-01-26 MED FILL — MUPIROCIN 2% OINTMENT: 2 | 7 days supply | Qty: 22 | Fill #0

## 2018-01-26 MED FILL — AMLODIPINE BESYLATE 5 MG TA: 5 | 30 days supply | Qty: 30 | Fill #0

## 2018-01-27 ENCOUNTER — Ambulatory Visit: Payer: Medicaid Other | Admitting: Rehabilitation

## 2018-01-27 ENCOUNTER — Ambulatory Visit: Payer: Medicaid Other | Admitting: Physical Therapy

## 2018-01-27 NOTE — Telephone Encounter (Signed)
Pt came to pick up handicap placard app he brought 01/17/18 for Dr. 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 Pt states he was informed yesterday to come today to pick up. He will be back at 2pm today to pick up forms.

## 2018-01-28 ENCOUNTER — Ambulatory Visit: Payer: Medicaid Other | Admitting: Rehabilitation

## 2018-01-28 ENCOUNTER — Encounter: Payer: Self-pay | Admitting: Rehabilitation

## 2018-01-28 ENCOUNTER — Ambulatory Visit: Payer: Medicaid Other | Admitting: Physical Therapy

## 2018-01-28 DIAGNOSIS — R2681 Unsteadiness on feet: Secondary | ICD-10-CM

## 2018-01-28 DIAGNOSIS — R296 Repeated falls: Secondary | ICD-10-CM

## 2018-01-28 DIAGNOSIS — R208 Other disturbances of skin sensation: Secondary | ICD-10-CM | POA: Diagnosis not present

## 2018-01-28 DIAGNOSIS — R2689 Other abnormalities of gait and mobility: Secondary | ICD-10-CM

## 2018-01-28 NOTE — Therapy (Signed)
[facility] [address] [place], [place], [address] Phone: [phone]   Fax:  [phone]  Physical Therapy Treatment  Patient Details  Name: Channin Agustin MRN: [id number] Date of Birth: 1960/08/22 Referring Provider (PT): [name], MD   Encounter Date: 01/28/2018  PT End of Session - 01/28/18 1312    Visit Number  3    Number of Visits  4    Date for PT Re-Evaluation  02/08/18    Authorization Type  Medicaid    Authorization - Visit Number  2    Authorization - Number of Visits  3    PT Start Time  1017    PT Stop Time  1102    PT Time Calculation (min)  45 min    Activity Tolerance  Patient tolerated treatment well    Behavior During Therapy  [facility] for tasks assessed/performed       Past Medical History:  Diagnosis Date  . Diabetes mellitus without complication ([place])   . Hypertension   . Rheumatoid arthritis [facility])     Past Surgical History:  Procedure Laterality Date  . KNEE SURGERY      There were no vitals filed for this visit.  Subjective Assessment - 01/28/18 1022    Subjective  Pt ambulatory with cane that is adjusted too high with pt stating, "I like it like this so I can prop on it."     Pertinent History  DM II with peripheral neuropathy, RA, HTN     Limitations  Walking    Patient Stated Goals  "For me and you to be able to walk without the cane."     Currently in Pain?  Yes    Pain Score  8     Pain Location  Generalized    Pain Orientation  Right;Left    Pain Descriptors / Indicators  Throbbing;Pins and needles    Pain Type  Chronic pain;Neuropathic pain    Pain Onset  More than a month ago    Pain Frequency  Constant    Aggravating Factors   not taking medication     Pain Relieving Factors  sleeping                        [facility] Adult PT Treatment/Exercise - 01/28/18 1020      Ambulation/Gait   Ambulation/Gait  Yes    Ambulation/Gait Assistance  4: Min guard;4:  Min assist    Ambulation/Gait Assistance Details  Pt ambulatory during session with SPC, however is adjusted too high, however when given reasoning why should be lowered, pt reports "I want it this high to prop on it."  Pt unwilling to adjust.  Cues for posture and safety with sequencing cane.  Did look more fluid at end of session following balance and strength tasks.      Ambulation Distance (Feet)  100 Feet    Assistive device  Straight cane    Gait Pattern  Step-through pattern;Decreased arm swing - left;Decreased stride length;Decreased stance time - right;Decreased step length - left;Right genu recurvatum;Decreased weight shift to right;Lateral hip instability;Trunk flexed;Wide base of support    Ambulation Surface  Level;Indoor      Neuro Re-ed    Neuro Re-ed Details   Performed corner balance tasks as follows to address multi-sensory balance input: feet apart EO on solid ground x 20 secs x 2 reps, feet together EO x 20 secs x 2 reps, feet together  EO with head turns up/down and side/side x 10 reps each, feet apart eyes closed x 2 sets of 20 secs.  Pt with marked difficulty when eyes closed and/or when feet together, but did add two balance challenges to HEP.  Also note that pt uses overt trunk motions to correct balance despite cues for using hips to correct.   Progressed to SLS on solid ground x 15 secs x 2 reps on each side with single UE support and cues for improved hip stability.  Semi tandem stance x 2 reps of 20 secs each direction without UE support and cues for improved weight shift.  Standing on small rocker board maintaining balance with feet apart x 2 sets of 20 secs with max cues for utilizing hips to regain balance (board biased both vertically then horizontally).  Rocking board ant/post with cues to hinge at hips to elicit hip strategy.  Ended standing on black balance beam maintaining balance x 2 sets of 20 secs progressing from BUE support>single UE support>no UE support. Pt  eventually able to utilize hip strategy well.        Exercises   Exercises  Knee/Hip      Knee/Hip Exercises: Aerobic   Other Aerobic  Sci fit x 2 mins at level 4 resistance.  Pt reports he will just go to the [facility] today, therefore did not want to continue in gym.              PT Education - 01/28/18 1311    Education Details  additional balance exercises to HEP     Person(s) Educated  Patient    Methods  Explanation;Demonstration;Handout    Comprehension  Verbalized understanding;Returned demonstration       PT Short Term Goals - 01/18/18 1508      PT SHORT TERM GOAL #1   Title  Pt will initiate HEP in order to indicate decreased fall risk and improved functional mobility. (Target Date: Following 3rd visit)    Baseline  dependent     Status  New      PT SHORT TERM GOAL #2   Title  Pt will demonstrate safe technique using SPC vs RW in order to indicate decreased fall risk.     Baseline  pt using metal walking pole without rubber tip      PT SHORT TERM GOAL #3   Title  Will perform BERG in order to formally assess balance/fall risk.  LTG to follow    Baseline  Unable to assess due to time constraint        PT Long Term Goals - 01/18/18 1510      PT LONG TERM GOAL #1   Title  Pt will be independent with on-going HEP in order to indicate decreased fall risk and improved functional mobility.  (Target Date: Following 6th visit)    Baseline  dependent       PT LONG TERM GOAL #2   Title  Pt will improve BERG balance test by 4 points from baseline in order to indicate decreased fall risk.     Baseline  unable to assess due to time constraint       PT LONG TERM GOAL #3   Title  Pt will improve gait speed to >/=2.40 ft/sec w/ LRAD in order to indicate decreased fall risk.     Baseline  1.86 ft/sec with walking pole      PT LONG TERM GOAL #4   Title  Pt will perform 5TSS  in </=25 secs without UE support in order to indicate decreased fall risk and improved functional  strength.     Baseline  31.85 secs without UE support       PT LONG TERM GOAL #5   Title  Will assess AFO needs and request order as appropriate to decrease fall risk.     Baseline  unable to assess due to time contraint             Plan - 01/28/18 1440    Clinical Impression Statement  Skilled session focused on high level balance with emphasis on multi-sensory challenge and compensation due to severe neuropathy.  Pt utilizes overt trunk movements to regain balance and requires max cues for hip strategy.      Rehab Potential  Good    Clinical Impairments Affecting Rehab Potential  question compliance, possible CVA?    PT Frequency  --   1x/week for 1 week, 2x/week for 1 week   PT Duration  Other (comment)    PT Treatment/Interventions  ADLs/Self Care Home Management;Aquatic Therapy;Electrical Stimulation;DME Instruction;Gait training;Stair training;Functional mobility training;Therapeutic activities;Therapeutic exercise;Balance training;Neuromuscular re-education;Patient/family education;Orthotic Fit/Training;Passive range of motion;Dry needling;Vestibular    PT Next Visit Plan  check STGs for 3 visits and send to Raquel Sarna to ask for 3 more visits in new year. Review HEP as needed. Continue strength and balance.  Compliant surface gait/balance.    Consulted and Agree with Plan of Care  Patient       Patient will benefit from skilled therapeutic intervention in order to improve the following deficits and impairments:  Abnormal gait, Decreased activity tolerance, Decreased balance, Decreased coordination, Decreased endurance, Decreased knowledge of precautions, Decreased knowledge of use of DME, Decreased mobility, Decreased safety awareness, Decreased strength, Impaired perceived functional ability, Impaired flexibility, Impaired sensation, Impaired UE functional use, Postural dysfunction  Visit Diagnosis: Other disturbances of skin sensation  Unsteadiness on feet  Other abnormalities  of gait and mobility  Repeated falls     Problem List There are no active problems to display for this patient.   Cameron Sprang, PT, MPT [facility] [address] [place], [place], [address] Phone: [phone]   Fax:  [phone] 01/28/18, 2:42 PM  Name: Kail Fraley MRN: [id number] Date of Birth: 02/03/1961

## 2018-01-28 NOTE — Patient Instructions (Signed)
Access Code: [email or web address]  URL: [email or web address]  Date: 01/28/2018  Prepared by: Cameron Sprang   Exercises  Step Up - 10 reps - 2 sets - 2x daily - 7x weekly  Standing March with Counter Support - 10 reps - 2 sets - 2x daily - 7x weekly  Standing Hip Extension with Counter Support - 10 reps - 2 sets - 2x daily - 7x weekly  Heel rises with counter support - 10 reps - 2 sets - 2x daily - 7x weekly  Toe Raises with Counter Support - 10 reps - 2 sets - 2x daily - 7x weekly  Side Stepping with Counter Support - 10 reps - 2 sets - 2x daily - 7x weekly  Standing Balance with Eyes Closed - 3 reps - 1 sets - 20 hold - 1x daily - 7x weekly  Romberg Stance - 3 reps - 1 sets - 20 hold - 1x daily - 7x weekly

## 2018-01-31 ENCOUNTER — Encounter

## 2018-01-31 ENCOUNTER — Telehealth: Payer: Self-pay | Admitting: *Deleted

## 2018-01-31 ENCOUNTER — Ambulatory Visit: Payer: Medicaid Other | Admitting: Neurology

## 2018-01-31 NOTE — Telephone Encounter (Signed)
Patient arrived late to his new patient appt without his co-pay.

## 2018-02-02 ENCOUNTER — Other Ambulatory Visit: Payer: Self-pay

## 2018-02-02 ENCOUNTER — Emergency Department (HOSPITAL_COMMUNITY): Payer: Medicaid Other

## 2018-02-02 ENCOUNTER — Emergency Department (HOSPITAL_BASED_OUTPATIENT_CLINIC_OR_DEPARTMENT_OTHER): Payer: Medicaid Other

## 2018-02-02 ENCOUNTER — Encounter (HOSPITAL_COMMUNITY): Payer: Self-pay | Admitting: Emergency Medicine

## 2018-02-02 ENCOUNTER — Emergency Department (HOSPITAL_COMMUNITY)
Admission: EM | Admit: 2018-02-02 | Discharge: 2018-02-02 | Disposition: A | Discharge status: Home / Self Care | Payer: Medicaid Other | Attending: Emergency Medicine | Admitting: Emergency Medicine

## 2018-02-02 DIAGNOSIS — M069 Rheumatoid arthritis, unspecified: Secondary | ICD-10-CM | POA: Insufficient documentation

## 2018-02-02 DIAGNOSIS — R7989 Other specified abnormal findings of blood chemistry: Secondary | ICD-10-CM | POA: Diagnosis not present

## 2018-02-02 DIAGNOSIS — F17210 Nicotine dependence, cigarettes, uncomplicated: Secondary | ICD-10-CM | POA: Diagnosis not present

## 2018-02-02 DIAGNOSIS — J189 Pneumonia, unspecified organism: Secondary | ICD-10-CM | POA: Diagnosis not present

## 2018-02-02 DIAGNOSIS — R0789 Other chest pain: Secondary | ICD-10-CM

## 2018-02-02 DIAGNOSIS — Z8673 Personal history of transient ischemic attack (TIA), and cerebral infarction without residual deficits: Secondary | ICD-10-CM | POA: Diagnosis not present

## 2018-02-02 DIAGNOSIS — E119 Type 2 diabetes mellitus without complications: Secondary | ICD-10-CM | POA: Diagnosis not present

## 2018-02-02 DIAGNOSIS — I10 Essential (primary) hypertension: Secondary | ICD-10-CM | POA: Insufficient documentation

## 2018-02-02 DIAGNOSIS — Z79899 Other long term (current) drug therapy: Secondary | ICD-10-CM | POA: Insufficient documentation

## 2018-02-02 DIAGNOSIS — R079 Chest pain, unspecified: Secondary | ICD-10-CM | POA: Diagnosis present

## 2018-02-02 LAB — CBC WITH DIFFERENTIAL/PLATELET
Abs Immature Granulocytes: 0.04 10*3/uL (ref 0.00–0.07)
Basophils Absolute: 0.1 10*3/uL (ref 0.0–0.1)
Basophils Relative: 1 %
Eosinophils Absolute: 0.3 10*3/uL (ref 0.0–0.5)
Eosinophils Relative: 4 %
HCT: 52.3 % — ABNORMAL HIGH (ref 39.0–52.0)
Hemoglobin: 17 g/dL (ref 13.0–17.0)
Immature Granulocytes: 1 %
Lymphocytes Relative: 35 %
Lymphs Abs: 3.1 10*3/uL (ref 0.7–4.0)
MCH: 29.4 pg (ref 26.0–34.0)
MCHC: 32.5 g/dL (ref 30.0–36.0)
MCV: 90.3 fL (ref 80.0–100.0)
Monocytes Absolute: 1 10*3/uL (ref 0.1–1.0)
Monocytes Relative: 11 %
Neutro Abs: 4.2 10*3/uL (ref 1.7–7.7)
Neutrophils Relative %: 48 %
Platelets: 247 10*3/uL (ref 150–400)
RBC: 5.79 MIL/uL (ref 4.22–5.81)
RDW: 13.6 % (ref 11.5–15.5)
WBC: 8.6 10*3/uL (ref 4.0–10.5)
nRBC: 0 % (ref 0.0–0.2)

## 2018-02-02 LAB — COMPREHENSIVE METABOLIC PANEL
ALT: 40 U/L (ref 0–44)
AST: 50 U/L — ABNORMAL HIGH (ref 15–41)
Albumin: 3.7 g/dL (ref 3.5–5.0)
Alkaline Phosphatase: 66 U/L (ref 38–126)
Anion gap: 12 (ref 5–15)
BUN: 8 mg/dL (ref 6–20)
CO2: 27 mmol/L (ref 22–32)
Calcium: 9.2 mg/dL (ref 8.9–10.3)
Chloride: 99 mmol/L (ref 98–111)
Creatinine, Ser: 0.92 mg/dL (ref 0.61–1.24)
GFR calc Af Amer: >60 mL/min (ref >60)
GFR calc non Af Amer: >60 mL/min (ref >60)
Glucose, Bld: 101 mg/dL — ABNORMAL HIGH (ref 70–99)
Potassium: 3.3 mmol/L — ABNORMAL LOW (ref 3.5–5.1)
Sodium: 138 mmol/L (ref 135–145)
Total Bilirubin: 1.1 mg/dL (ref 0.3–1.2)
Total Protein: 7.6 g/dL (ref 6.5–8.1)

## 2018-02-02 LAB — D-DIMER, QUANTITATIVE (NOT AT ARMC): D-Dimer, Quant: 1.56 ug/mL-FEU — ABNORMAL HIGH (ref 0.00–0.50)

## 2018-02-02 LAB — TROPONIN I
Troponin I: <0.03 ng/mL (ref <0.03)
Troponin I: <0.03 ng/mL (ref <0.03)

## 2018-02-02 LAB — LIPASE, BLOOD: Lipase: 34 U/L (ref 11–51)

## 2018-02-02 MED ORDER — AZITHROMYCIN 250 MG PO TABS: 250.0000 mg | ORAL_TABLET | Freq: Every day | ORAL | 0 refills | Status: DC

## 2018-02-02 MED ORDER — IOPAMIDOL (ISOVUE-370) INJECTION 76%
100.0000 mL | Freq: Once | INTRAVENOUS | Status: AC | PRN
  Administered 2018-02-02: 100 mL via INTRAVENOUS

## 2018-02-02 MED ORDER — IOPAMIDOL (ISOVUE-370) INJECTION 76%
INTRAVENOUS | Status: AC
  Filled 2018-02-02: qty 100

## 2018-02-02 NOTE — ED Triage Notes (Signed)
Pt c/o chest pain x 4 hours, pain radiates to back and right arm. Denies shortness of breath/nausea/vomiting.

## 2018-02-02 NOTE — ED Provider Notes (Signed)
[place] EMERGENCY DEPARTMENT Provider Note   CSN: [id number] Arrival date & time: 02/02/18  1657     History   Chief Complaint Chief Complaint  Patient presents with  . Chest Pain    HPI Tony Ortega is a 57 y.o. male with a past medical history of DM, hypertension, rheumatoid arthritis, CVA, who presents today for evaluation of chest pain.  He reports that for approximately 4 hours he has had chest pain, worse on the right than the left radiating down his right arm and into his back.  He denies shortness of breath, nausea vomiting or diarrhea.  He has not been sick recently.  No coughs.  He denies any new numbness tingling or weakness.  No abdominal pain.  HPI  Past Medical History:  Diagnosis Date  . Diabetes mellitus without complication ([place])   . Hypertension   . Rheumatoid arthritis ([place])     There are no active problems to display for this patient.   Past Surgical History:  Procedure Laterality Date  . KNEE SURGERY          Home Medications    Prior to Admission medications   Medication Sig Start Date End Date Taking? Authorizing Provider  amitriptyline (ELAVIL) 25 MG tablet Take 1 tablet (25 mg total) by mouth at bedtime. 07/07/17  Yes Hagler, Aaron Edelman, MD  amLODipine (NORVASC) 5 MG tablet Take 1 tablet (5 mg total) by mouth daily. To lower blood pressure 12/06/17  Yes Fulp, Cammie, MD  aspirin 325 MG tablet Take 325 mg by mouth daily.   Yes [provider]  atorvastatin (LIPITOR) 20 MG tablet Take 20 mg by mouth daily.   Yes [provider]  clotrimazole (LOTRIMIN) 1 % cream Apply to both feet and between toes twice daily for 4 weeks 12/16/17  Yes Galaway, Stephani Police, DPM  hydrochlorothiazide (HYDRODIURIL) 25 MG tablet Take 1 tablet (25 mg total) by mouth daily. 03/30/17  Yes Augusto Gamble B, NP  metFORMIN (GLUCOPHAGE) 1000 MG tablet Take 1,000 mg by mouth 2 (two) times daily with a meal.   Yes [provider]    mupirocin ointment (BACTROBAN) 2 % Apply twice daily to affected skin area twice daily for 5 days 01/10/18  Yes Fulp, Cammie, MD  predniSONE (DELTASONE) 5 MG tablet Take 5 mg by mouth daily with breakfast.   Yes [provider]  azithromycin (ZITHROMAX) 250 MG tablet Take 1 tablet (250 mg total) by mouth daily. Take first 2 tablets together, then 1 every day until finished. 02/02/18   Lorin Glass, PA-C    Family History Family History  Problem Relation Age of Onset  . CAD Neg Hx     Social History Social History   Tobacco Use  . Smoking status: Current Some Day Smoker    Packs/day: 0.10    Types: Cigarettes  . Smokeless tobacco: Never Used  Substance Use Topics  . Alcohol use: No  . Drug use: Yes    Types: Marijuana     Allergies   Pork-derived products   Review of Systems Review of Systems  Constitutional: Negative for chills and fever.  HENT: Negative for congestion and ear pain.   Respiratory: Negative for choking and shortness of breath.   Cardiovascular: Positive for chest pain. Negative for palpitations and leg swelling.  Gastrointestinal: Negative for abdominal pain, diarrhea and nausea.  Musculoskeletal: Positive for back pain. Negative for neck pain.  Skin: Negative for color change.  Neurological: Negative  for dizziness, weakness and headaches.  All other systems reviewed and are negative.    Physical Exam Updated Vital Signs BP 114/80   Pulse 63   Resp 15   SpO2 98%   Physical Exam Vitals signs and nursing note reviewed.  Constitutional:      General: He is not in acute distress.    Appearance: He is well-developed. He is not toxic-appearing or diaphoretic.  HENT:     Head: Normocephalic and atraumatic.  Eyes:     Extraocular Movements: Extraocular movements intact.     Conjunctiva/sclera: Conjunctivae normal.     Pupils: Pupils are equal, round, and reactive to light.  Neck:     Musculoskeletal: Neck supple.   Cardiovascular:     Rate and Rhythm: Normal rate and regular rhythm.     Pulses:          Radial pulses are 2+ on the right side and 2+ on the left side.       Dorsalis pedis pulses are 2+ on the right side and 2+ on the left side.       Posterior tibial pulses are 2+ on the right side and 2+ on the left side.     Heart sounds: Normal heart sounds. No murmur.  Pulmonary:     Effort: Pulmonary effort is normal. No respiratory distress.     Breath sounds: Normal breath sounds. No decreased breath sounds.  Chest:     Chest wall: No deformity, tenderness or crepitus.  Abdominal:     General: Bowel sounds are normal.     Palpations: Abdomen is soft.     Tenderness: There is no abdominal tenderness. There is no guarding.  Musculoskeletal:     Right lower leg: He exhibits no tenderness. No edema.     Left lower leg: He exhibits no tenderness. No edema.  Skin:    General: Skin is warm and dry.  Neurological:     General: No focal deficit present.     Mental Status: He is alert.      ED Treatments / Results  Labs (all labs ordered are listed, but only abnormal results are displayed) Labs Reviewed  D-DIMER, QUANTITATIVE (NOT AT [facility]) - Abnormal; Notable for the following components:      Result Value   D-Dimer, Quant 1.56 (*)    All other components within normal limits  COMPREHENSIVE METABOLIC PANEL - Abnormal; Notable for the following components:   Potassium 3.3 (*)    Glucose, Bld 101 (*)    AST 50 (*)    All other components within normal limits  CBC WITH DIFFERENTIAL/PLATELET - Abnormal; Notable for the following components:   HCT 52.3 (*)    All other components within normal limits  LIPASE, BLOOD  TROPONIN I  TROPONIN I    EKG EKG Interpretation  Date/Time:  Wednesday February 02 2018 21:56:17 EST Ventricular Rate:  74 PR Interval:    QRS Duration: 94 QT Interval:  423 QTC Calculation: 470 R Axis:   76 Text Interpretation:  Sinus rhythm Right atrial  enlargement Consider left ventricular hypertrophy Confirmed by Gerlene Fee [phone]) on 02/02/2018 10:22:47 PM   Radiology Dg Chest 2 View  Result Date: 02/02/2018 CLINICAL DATA:  Right-sided chest pain. EXAM: CHEST - 2 VIEW COMPARISON:  None. FINDINGS: The heart size and mediastinal contours are within normal limits. Aortic atherosclerosis. Both lungs are clear. The visualized skeletal structures are unremarkable. IMPRESSION: No active cardiopulmonary disease. Electronically Signed   By:  Earle Gell M.D.   On: 02/02/2018 17:58   Ct Angio Chest/abd/pel For Dissection W And/or W/wo  Result Date: 02/02/2018 CLINICAL DATA:  Anterior chest pain right greater than left radiating to right arm and back. Elevated D-dimer with negative lower extremity ultrasound bilaterally. EXAM: CT ANGIOGRAPHY CHEST, ABDOMEN AND PELVIS TECHNIQUE: Multidetector CT imaging through the chest, abdomen and pelvis was performed using the standard protocol during bolus administration of intravenous contrast. Multiplanar reconstructed images and MIPs were obtained and reviewed to evaluate the vascular anatomy. CONTRAST:  19mL ISOVUE-370 IOPAMIDOL (ISOVUE-370) INJECTION 76% COMPARISON:  Chest x-ray today. FINDINGS: CTA CHEST FINDINGS Cardiovascular: Heart is normal size. Thoracic aorta is normal caliber without dissection or aneurysm. Pulmonary arterial system is within normal. Mediastinum/Nodes: No evidence of mediastinal or hilar adenopathy. Remaining mediastinal structures are normal. Lungs/Pleura: Lungs are well inflated without lobar consolidation or effusion. There is a subpleural 4 mm nodule over the lingula. There is subtle bilateral patchy nodular opacification right worse than left. Minimal linear density over the left upper lobe and posterior left lower lobe. Airways are normal. Musculoskeletal: Normal. Review of the MIP images confirms the above findings. CTA ABDOMEN AND PELVIS FINDINGS VASCULAR Aorta: Normal in caliber  without evidence of aneurysm or dissection. Subtle plaque at the bifurcation. Celiac: Normal. SMA: Normal. Renals: Normal. IMA: Normal. Inflow: Patent without stenosis or occlusion. Veins: Unremarkable. Review of the MIP images confirms the above findings. NON-VASCULAR Hepatobiliary: Gallbladder is contracted. Liver and biliary tree are normal. Pancreas: Normal. Spleen: Normal. Adrenals/Urinary Tract: Adrenal glands are normal. Kidneys are normal in size without hydronephrosis or nephrolithiasis. Ureters and bladder are normal. Stomach/Bowel: Stomach and small bowel are normal. Appendix is normal. There is moderate stool throughout the colon which is otherwise within normal. Lymphatic: No adenopathy. Reproductive: Normal. Other: No free fluid or focal inflammatory change. Musculoskeletal: Normal. Review of the MIP images confirms the above findings. IMPRESSION: Unremarkable thoracoabdominal aorta without evidence of aneurysm or dissection. Subtle patchy nodular opacification within the lungs right worse than left which may be due to inflammatory or atypical infectious process. More discrete subpleural 4 mm nodule over the lingula. Recommend follow-up CT 4-6 weeks. No acute findings in the abdomen/pelvis. Aortic Atherosclerosis (ICD10-I70.0). Moderate fecal retention throughout the colon without evidence of obstruction. Electronically Signed   By: Marin Olp M.D.   On: 02/02/2018 21:06   Vas [place] Lower Extremity Venous (dvt) (only [facility])  Result Date: 02/02/2018  Lower Venous Study Indications: Swelling.  Performing Technologist: Maudry Mayhew MHA, RDMS, RVT, RDCS  Examination Guidelines: A complete evaluation includes B-mode imaging, spectral Doppler, color Doppler, and power Doppler as needed of all accessible portions of each vessel. Bilateral testing is considered an integral part of a complete examination. Limited examinations for reoccurring indications may be performed as noted.  Right Venous  Findings: +---------+---------------+---------+-----------+----------+-------+          CompressibilityPhasicitySpontaneityPropertiesSummary +---------+---------------+---------+-----------+----------+-------+ CFV      Full           Yes      Yes                          +---------+---------------+---------+-----------+----------+-------+ SFJ      Full                                                 +---------+---------------+---------+-----------+----------+-------+  FV Prox  Full                                                 +---------+---------------+---------+-----------+----------+-------+ FV Mid   Full                                                 +---------+---------------+---------+-----------+----------+-------+ FV DistalFull                                                 +---------+---------------+---------+-----------+----------+-------+ PFV      Full                                                 +---------+---------------+---------+-----------+----------+-------+ POP      Full           Yes      Yes                          +---------+---------------+---------+-----------+----------+-------+ PTV      Full                                                 +---------+---------------+---------+-----------+----------+-------+ PERO     Full                                                 +---------+---------------+---------+-----------+----------+-------+  Left Venous Findings: +---------+---------------+---------+-----------+----------+-------+          CompressibilityPhasicitySpontaneityPropertiesSummary +---------+---------------+---------+-----------+----------+-------+ CFV      Full           Yes      Yes                          +---------+---------------+---------+-----------+----------+-------+ SFJ      Full                                                  +---------+---------------+---------+-----------+----------+-------+ FV Prox  Full                                                 +---------+---------------+---------+-----------+----------+-------+ FV Mid   Full                                                 +---------+---------------+---------+-----------+----------+-------+  FV DistalFull                                                 +---------+---------------+---------+-----------+----------+-------+ PFV      Full                                                 +---------+---------------+---------+-----------+----------+-------+ POP      Full           Yes      Yes                          +---------+---------------+---------+-----------+----------+-------+ PTV      Full                                                 +---------+---------------+---------+-----------+----------+-------+ PERO     Full                                                 +---------+---------------+---------+-----------+----------+-------+    Summary: Right: There is no evidence of deep vein thrombosis in the lower extremity. No cystic structure found in the popliteal fossa. Left: There is no evidence of deep vein thrombosis in the lower extremity. No cystic structure found in the popliteal fossa.  *See table(s) above for measurements and observations.    Preliminary     Procedures Procedures (including critical care time)  Medications Ordered in ED Medications  iopamidol (ISOVUE-370) 76 % injection (has no administration in time range)  iopamidol (ISOVUE-370) 76 % injection (has no administration in time range)  iopamidol (ISOVUE-370) 76 % injection 100 mL (100 mLs Intravenous Contrast Given 02/02/18 2032)     Initial Impression / Assessment and Plan / ED Course  I have reviewed the triage vital signs and the nursing notes.  Pertinent labs & imaging results that were available during my care of the patient were  reviewed by me and considered in my medical decision making (see chart for details).  Clinical Course as of Feb 04 16  Wed Feb 02, 2018  2230 Patient reevaluated, he reports that his pain improved significantly after he had multiple rounds of flatulence.     [EH]    Clinical Course User Index [EH] Lorin Glass, PA-C   Waunita Schooner presents today for evaluation of bilateral anterior chest pain right worse than left radiating to his back and right arm.  Labs were obtained and reviewed, he is not anemic and does not have leukocytosis.  His potassium is very slightly low at 3.3, however he does not have significant electrolyte derangements.  Vitals were reviewed, while in room oral temperature was obtained and he was afebrile.  Troponin x2 normal.  Given atypical nature of chest pain and inability to apply PERC criteria due to age d-dimer was obtained which was slightly elevated.  Ultrasound of bilateral lower legs was performed without evidence of DVT.  Given atypical nature of  chest pain, radiating to the back and right arm increased suspicion for dissection.  Pulmonary embolism is very unlikely given no source for embolism found.  CTA dissection study was performed showing concern for atypical pneumonia versus inflammation with nodule, without evidence of dissection.  Was informed of need to follow-up as recommended by radiology.  He is treated with azithromycin for presumed atypical pneumonia.  Do not suspect ACS, heart score is 3, negative troponin x2 without acute EKG changes.  Return precautions were discussed with patient who states their understanding.  At the time of discharge patient denied any unaddressed complaints or concerns.  Patient is agreeable for discharge home.   Final Clinical Impressions(s) / ED Diagnoses   Final diagnoses:  Atypical pneumonia  Atypical chest pain  Elevated d-dimer    ED Discharge Orders         Ordered    azithromycin (ZITHROMAX) 250 MG  tablet  Daily     02/02/18 2241           Lorin Glass, [place] 02/03/18 0022    Maudie Flakes, MD 02/05/18 [phone]

## 2018-02-02 NOTE — ED Notes (Signed)
Pants and underwear removed for vasc

## 2018-02-02 NOTE — ED Notes (Signed)
Patient transported to X-ray 

## 2018-02-02 NOTE — Discharge Instructions (Addendum)
Today your CT scan showed atypical pneumonia.  I have put the recommendations below for follow-up.  Please make sure that you get this follow-up to ensure that this truly is pneumonia and it gets better with antibiotics.  If you develop any worsening symptoms please seek additional medical care and evaluation.  Please do not take your metformin for the next 3 days as you received contrast through your IV for your CAT scan today.  Please make sure you drink extra fluid tonight and for the next three days to help protect your kidneys.  Please make sure that you are watching what you eat and drinking extra water to help flush this contrast from your system.    Your CT scan also showed that you are slightly constipated.  Please make sure you are drinking plenty of fluids and eating foods that are high in fiber.    Unremarkable thoracoabdominal aorta without evidence of aneurysm or  dissection.     Subtle patchy nodular opacification within the lungs right worse  than left which may be due to inflammatory or atypical infectious  process. More discrete subpleural 4 mm nodule over the lingula.  Recommend follow-up CT 4-6 weeks.     No acute findings in the abdomen/pelvis.     Aortic Atherosclerosis (ICD10-I70.0).     Moderate fecal retention throughout the colon without evidence of  obstruction.

## 2018-02-02 NOTE — Progress Notes (Signed)
*  Preliminary Results* Bilateral lower extremity venous duplex completed. Bilateral lower extremities are negative for deep vein thrombosis. There is no evidence of Baker's cyst bilaterally.  02/02/2018 7:10 PM Maudry Mayhew, MHA, RVT, RDCS, RDMS

## 2018-02-02 NOTE — ED Notes (Signed)
Pt given warm blanket.

## 2018-02-02 NOTE — ED Notes (Signed)
Patient transported to CT 

## 2018-02-03 ENCOUNTER — Encounter: Payer: Self-pay | Admitting: Neurology

## 2018-02-03 MED FILL — AZITHROMYCIN 250 MG TABLET: 250 | 5 days supply | Qty: 6 | Fill #0

## 2018-02-03 NOTE — Telephone Encounter (Signed)
Form was placed in the alphabetic fold for pick up.

## 2018-02-04 ENCOUNTER — Ambulatory Visit: Payer: Medicaid Other | Admitting: Physical Therapy

## 2018-02-15 ENCOUNTER — Ambulatory Visit: Payer: Medicaid Other | Attending: Critical Care Medicine | Admitting: Critical Care Medicine

## 2018-02-15 ENCOUNTER — Encounter: Payer: Self-pay | Admitting: Critical Care Medicine

## 2018-02-15 VITALS — BP 161/97 | HR 82 | Temp 98.0°F | Resp 20 | Wt 130.0 lb

## 2018-02-15 DIAGNOSIS — Z7982 Long term (current) use of aspirin: Secondary | ICD-10-CM | POA: Insufficient documentation

## 2018-02-15 DIAGNOSIS — Z8701 Personal history of pneumonia (recurrent): Secondary | ICD-10-CM | POA: Insufficient documentation

## 2018-02-15 DIAGNOSIS — E1165 Type 2 diabetes mellitus with hyperglycemia: Secondary | ICD-10-CM | POA: Diagnosis present

## 2018-02-15 DIAGNOSIS — E1142 Type 2 diabetes mellitus with diabetic polyneuropathy: Secondary | ICD-10-CM | POA: Diagnosis not present

## 2018-02-15 DIAGNOSIS — Z79899 Other long term (current) drug therapy: Secondary | ICD-10-CM | POA: Diagnosis not present

## 2018-02-15 DIAGNOSIS — Z7952 Long term (current) use of systemic steroids: Secondary | ICD-10-CM | POA: Diagnosis not present

## 2018-02-15 DIAGNOSIS — Z7984 Long term (current) use of oral hypoglycemic drugs: Secondary | ICD-10-CM | POA: Insufficient documentation

## 2018-02-15 DIAGNOSIS — J189 Pneumonia, unspecified organism: Secondary | ICD-10-CM | POA: Diagnosis not present

## 2018-02-15 DIAGNOSIS — M069 Rheumatoid arthritis, unspecified: Secondary | ICD-10-CM | POA: Insufficient documentation

## 2018-02-15 DIAGNOSIS — F17210 Nicotine dependence, cigarettes, uncomplicated: Secondary | ICD-10-CM | POA: Diagnosis not present

## 2018-02-15 DIAGNOSIS — I10 Essential (primary) hypertension: Secondary | ICD-10-CM | POA: Diagnosis not present

## 2018-02-15 DIAGNOSIS — F17200 Nicotine dependence, unspecified, uncomplicated: Secondary | ICD-10-CM

## 2018-02-15 LAB — GLUCOSE, POCT (MANUAL RESULT ENTRY): POC Glucose: 176 mg/dl — AB (ref 70–99)

## 2018-02-15 MED ORDER — HYDROCHLOROTHIAZIDE 25 MG PO TABS: 25.0000 mg | ORAL_TABLET | Freq: Every day | ORAL | 6 refills | Status: DC

## 2018-02-15 MED ORDER — AMLODIPINE BESYLATE 5 MG PO TABS: 5.0000 mg | ORAL_TABLET | Freq: Every day | ORAL | 6 refills | Status: DC

## 2018-02-15 MED ORDER — METFORMIN HCL 1000 MG PO TABS: 1000.0000 mg | ORAL_TABLET | Freq: Two times a day (BID) | ORAL | 6 refills | Status: DC

## 2018-02-15 MED ORDER — PREDNISONE 5 MG PO TABS: 5.0000 mg | ORAL_TABLET | Freq: Every day | ORAL | 6 refills | Status: DC

## 2018-02-15 MED ORDER — ATORVASTATIN CALCIUM 20 MG PO TABS: 20.0000 mg | ORAL_TABLET | Freq: Every day | ORAL | 6 refills | Status: DC

## 2018-02-15 MED ORDER — AMITRIPTYLINE HCL 25 MG PO TABS: 25.0000 mg | ORAL_TABLET | Freq: Every day | ORAL | 6 refills | Status: DC

## 2018-02-15 MED FILL — HYDROCHLOROTHIAZIDE 25 MG T: 25 | 30 days supply | Qty: 30 | Fill #0

## 2018-02-15 MED FILL — predniSONE 5 MG TABS: 5 | 30 days supply | Qty: 30 | Fill #0

## 2018-02-15 MED FILL — metFORMIN HCL 1000 MG TABS: 1000 | 30 days supply | Qty: 60 | Fill #0

## 2018-02-15 MED FILL — AMITRIPTYLINE HCL 25 MG TAB: 25 | 30 days supply | Qty: 30 | Fill #0

## 2018-02-15 MED FILL — ATORVASTATIN 20 MG TABLET: 20 | 30 days supply | Qty: 30 | Fill #0

## 2018-02-15 NOTE — Assessment & Plan Note (Signed)
Tobacco use ongoing  Smoking cessation counseling given to the patient

## 2018-02-15 NOTE — Assessment & Plan Note (Signed)
Recent community acquired pneumonia with bilateral nodular infiltrates now improved status post azithromycin therapy  Plan No additional antibiotics needed The patient was encouraged to pursue smoking cessation

## 2018-02-15 NOTE — Assessment & Plan Note (Signed)
History of peripheral neuropathy type 2 diabetes Refills on metformin sent to the pharmacy

## 2018-02-15 NOTE — Assessment & Plan Note (Signed)
Hypertension poorly controlled, the patient is not taking his hypertensive medications regularly He has not had a refill in several weeks  Refill on amlodipine sent to pharmacy

## 2018-02-15 NOTE — Assessment & Plan Note (Signed)
Rheumatoid arthritis with multiple joints involved  Upcoming rheumatology appointment pending  Chronic prednisone use  Prednisone was refilled

## 2018-02-15 NOTE — Patient Instructions (Signed)
No additional antibiotics needed  Refills on all of your medications were sent to our pharmacy  Work on smoking cessation  We recommend a flu vaccine and tetanus vaccine  Keep your rheumatology visit

## 2018-02-15 NOTE — Progress Notes (Addendum)
Subjective:    Patient ID: Tony Ortega, male    DOB: [birth year], 58 y.o.   MRN: [id number]  57 y.o.M who was seen in the emergency room on December 25 for chest pain which was a NEW PROBLEM.    Evaluation there showed atypical pneumonia pattern on CT Angio of the chest.  The patient's evaluation was negative for pulmonary embolism or deep venous thrombosis and no aortic dissection.   The patient was given azithromycin and has had significant improvement in the chest pain.  Note chest pain evaluation below seen 12/25 for chest pain and eval showed atyptical PNA.    Hx of DM2 poorly controlled with neuropathy, RA, HTN, recurrent chest pain in past .   Note no admits but multiple ED visits in 2019 for a variety of complaints.      Chest Pain   This is a new problem. The current episode started 1 to 4 weeks ago. The onset quality is gradual (came on over a few days). The problem occurs 2 to 4 times per day. The problem has been rapidly improving. The pain is present in the substernal region and lateral region (right lateral). Pain scale: was 8/10 now is gone  The pain is mild. The quality of the pain is described as tightness (felt alot of gas ). The pain does not radiate. Associated symptoms include diaphoresis, leg pain, shortness of breath and sputum production. Pertinent negatives include no cough, fever, headaches, hemoptysis, lower extremity edema or palpitations.  His past medical history is significant for diabetes.  Pertinent negatives for past medical history include no COPD, no CHF, no MI and no PE. Past medical history comments: no GERD hx    Past Medical History:  Diagnosis Date  . Diabetes mellitus without complication ([place])   . Hypertension   . Rheumatoid arthritis ([place])      Family History  Problem Relation Age of Onset  . CAD Neg Hx      Social History   Socioeconomic History  . Marital status: Married    Spouse name: Not on file  . Number of children: Not on file    . Years of education: Not on file  . Highest education level: Not on file  Occupational History  . Not on file  Social Needs  . Financial resource strain: Not on file  . Food insecurity:    Worry: Not on file    Inability: Not on file  . Transportation needs:    Medical: Not on file    Non-medical: Not on file  Tobacco Use  . Smoking status: Current Some Day Smoker    Packs/day: 0.10    Types: Cigarettes  . Smokeless tobacco: Never Used  Substance and Sexual Activity  . Alcohol use: No  . Drug use: Not on file  . Sexual activity: Not on file  Lifestyle  . Physical activity:    Days per week: Not on file    Minutes per session: Not on file  . Stress: Not on file  Relationships  . Social connections:    Talks on phone: Not on file    Gets together: Not on file    Attends religious service: Not on file    Active member of club or organization: Not on file    Attends meetings of clubs or organizations: Not on file    Relationship status: Not on file  . Intimate partner violence:    Fear of current or ex partner: Not  on file    Emotionally abused: Not on file    Physically abused: Not on file    Forced sexual activity: Not on file  Other Topics Concern  . Not on file  Social History Narrative  . Not on file     Allergies  Allergen Reactions  . Pork-Derived Products Other (See Comments)    No pork products     Outpatient Medications Prior to Visit  Medication Sig Dispense Refill  . aspirin 325 MG tablet Take 325 mg by mouth daily.    [place] Kitchen amitriptyline (ELAVIL) 25 MG tablet Take 1 tablet (25 mg total) by mouth at bedtime. 30 tablet 1  . atorvastatin (LIPITOR) 20 MG tablet Take 20 mg by mouth daily.    . metFORMIN (GLUCOPHAGE) 1000 MG tablet Take 1,000 mg by mouth 2 (two) times daily with a meal.    . clotrimazole (LOTRIMIN) 1 % cream Apply to both feet and between toes twice daily for 4 weeks (Patient not taking: Reported on 02/15/2018) 30 g 1  . mupirocin ointment  (BACTROBAN) 2 % Apply twice daily to affected skin area twice daily for 5 days (Patient not taking: Reported on 02/15/2018) 22 g 0  . amLODipine (NORVASC) 5 MG tablet Take 1 tablet (5 mg total) by mouth daily. To lower blood pressure (Patient not taking: Reported on 02/15/2018) 30 tablet 6  . azithromycin (ZITHROMAX) 250 MG tablet Take 1 tablet (250 mg total) by mouth daily. Take first 2 tablets together, then 1 every day until finished. (Patient not taking: Reported on 02/15/2018) 6 tablet 0  . hydrochlorothiazide (HYDRODIURIL) 25 MG tablet Take 1 tablet (25 mg total) by mouth daily. (Patient not taking: Reported on 02/15/2018) 30 tablet 0  . predniSONE (DELTASONE) 5 MG tablet Take 5 mg by mouth daily with breakfast.     No facility-administered medications prior to visit.      Review of Systems  Constitutional: Positive for diaphoresis. Negative for fever.  Respiratory: Positive for sputum production and shortness of breath. Negative for cough and hemoptysis.   Cardiovascular: Positive for chest pain. Negative for palpitations.  Neurological: Negative for headaches.       Objective:   Physical Exam Vitals:   02/15/18 1037  Weight: 130 lb (59 kg)    Gen: Pleasant, well-nourished, in no distress,  normal affect  ENT: No lesions,  mouth clear,  oropharynx clear, no postnasal drip  Neck: No JVD, no TMG, no carotid bruits  Lungs: No use of accessory muscles, no dullness to percussion, clear without rales or rhonchi  Cardiovascular: RRR, heart sounds normal, no murmur or gallops, no peripheral edema  Abdomen: soft and NT, no HSM,  BS normal  Musculoskeletal: No deformities, no cyanosis or clubbing  Neuro: alert, non focal  Skin: Warm, no lesions or rashes  No results found.  CT Angio 12/25: IMPRESSION: Unremarkable thoracoabdominal aorta without evidence of aneurysm or dissection.  Subtle patchy nodular opacification within the lungs right worse than left which may be due to  inflammatory or atypical infectious process. More discrete subpleural 4 mm nodule over the lingula. Recommend follow-up CT 4-6 weeks.  No acute findings in the abdomen/pelvis.  Aortic Atherosclerosis (ICD10-I70.0).  Moderate fecal retention throughout the colon without evidence of obstruction.  Venous doppler u/s LE 12/25: normal , no DVT  BMP Latest Ref Rng & Units 02/02/2018 01/10/2018 12/24/2017  Glucose 70 - 99 mg/dL 101(H) 138(H) 172(H)  BUN 6 - 20 mg/dL 8 9 7   Creatinine  0.61 - 1.24 mg/dL 0.92 0.86 0.95  BUN/Creat Ratio 9 - 20 - 10 -  Sodium 135 - 145 mmol/L 138 138 137  Potassium 3.5 - 5.1 mmol/L 3.3(L) 4.3 3.5  Chloride 98 - 111 mmol/L 99 101 102  CO2 22 - 32 mmol/L 27 21 25   Calcium 8.9 - 10.3 mg/dL 9.2 9.8 9.1   CBC Latest Ref Rng & Units 02/02/2018 12/24/2017 12/06/2017  WBC 4.0 - 10.5 K/uL 8.6 5.8 6.8  Hemoglobin 13.0 - 17.0 g/dL 17.0 16.4 15.0  Hematocrit 39.0 - 52.0 % 52.3(H) 50.6 43.3  Platelets 150 - 400 K/uL 247 229 219  12/25 Trop 0.00 12/25: ECG:  NSR,  Poss LVH,  No acute process  12/25 D [place] 1.56      Assessment & Plan:  I personally reviewed all images and lab data in the [facility] system as well as any outside material available during this office visit and agree with the  radiology impressions.   Essential hypertension Hypertension poorly controlled, the patient is not taking his hypertensive medications regularly He has not had a refill in several weeks  Refill on amlodipine sent to pharmacy  Community acquired pneumonia Recent community acquired pneumonia with bilateral nodular infiltrates now improved status post azithromycin therapy  Plan No additional antibiotics needed The patient was encouraged to pursue smoking cessation  Poorly controlled type 2 diabetes mellitus with peripheral neuropathy ([place]) History of peripheral neuropathy type 2 diabetes Refills on metformin sent to the pharmacy  Tobacco use disorder Tobacco use  ongoing  Smoking cessation counseling given to the patient  Rheumatoid arthritis ([place]) Rheumatoid arthritis with multiple joints involved  Upcoming rheumatology appointment pending  Chronic prednisone use  Prednisone was refilled   Diagnoses and all orders for this visit:  Community acquired pneumonia, unspecified laterality  Poorly controlled type 2 diabetes mellitus with peripheral neuropathy ([facility]) -     Glucose (CBG)  Essential hypertension -     amLODipine (NORVASC) 5 MG tablet; Take 1 tablet (5 mg total) by mouth daily. To lower blood pressure  Tobacco use disorder  Rheumatoid arthritis involving multiple sites, unspecified rheumatoid factor presence ([place])  Other orders -     predniSONE (DELTASONE) 5 MG tablet; Take 1 tablet (5 mg total) by mouth daily with breakfast. -     metFORMIN (GLUCOPHAGE) 1000 MG tablet; Take 1 tablet (1,000 mg total) by mouth 2 (two) times daily with a meal. -     hydrochlorothiazide (HYDRODIURIL) 25 MG tablet; Take 1 tablet (25 mg total) by mouth daily. -     atorvastatin (LIPITOR) 20 MG tablet; Take 1 tablet (20 mg total) by mouth daily. -     amitriptyline (ELAVIL) 25 MG tablet; Take 1 tablet (25 mg total) by mouth at bedtime.    The patient will also receive a tetanus vaccine and flu vaccine at this visit

## 2018-02-18 ENCOUNTER — Ambulatory Visit: Payer: Medicaid Other | Attending: Family Medicine | Admitting: Physical Therapy

## 2018-02-18 DIAGNOSIS — R296 Repeated falls: Secondary | ICD-10-CM | POA: Insufficient documentation

## 2018-02-18 DIAGNOSIS — R2681 Unsteadiness on feet: Secondary | ICD-10-CM | POA: Insufficient documentation

## 2018-02-18 DIAGNOSIS — R208 Other disturbances of skin sensation: Secondary | ICD-10-CM | POA: Insufficient documentation

## 2018-02-18 DIAGNOSIS — R2689 Other abnormalities of gait and mobility: Secondary | ICD-10-CM | POA: Diagnosis present

## 2018-02-18 NOTE — Therapy (Signed)
[facility] [address] [place], [place], [address] Phone: [phone]   Fax:  [phone]  Physical Therapy Treatment/Recertification for MCD  Patient Details  Name: Tony Ortega MRN: [id number] Date of Birth: 08-30-1960 Referring Provider (PT): [name], MD   Encounter Date: 02/18/2018  PT End of Session - 02/18/18 0909    Visit Number  4    Number of Visits  4    Date for PT Re-Evaluation  03/18/18    Authorization Type  Medicaid 4/4 visits completed and starting new year so PT will ask for 4 more weeks of PT recommending 2X per week    Authorization - Visit Number  3    Authorization - Number of Visits  3    PT Start Time  0805    PT Stop Time  0852    PT Time Calculation (min)  47 min    Equipment Utilized During Treatment  Gait belt    Activity Tolerance  Patient tolerated treatment well    Behavior During Therapy  [facility] for tasks assessed/performed       Past Medical History:  Diagnosis Date  . Diabetes mellitus without complication ([place])   . Hypertension   . Rheumatoid arthritis [facility])     Past Surgical History:  Procedure Laterality Date  . KNEE SURGERY      There were no vitals filed for this visit.  Subjective Assessment - 02/18/18 0817    Subjective  Pt relays he is improving with PT, he relays compliance with HEP however he still has trouble with balance, strength, endurance, and gait.     Limitations  Walking    Patient Stated Goals  "For me and you to be able to walk without the cane."     Currently in Pain?  Yes    Pain Score  8     Pain Location  --   widespread constant pain due to arthritis and neuropathy        [facility] PT Assessment - 02/18/18 0001      Assessment   Medical Diagnosis  Gait abnormality    Referring Provider (PT)  [name], MD    Next MD Visit  03/17/18      Ambulation/Gait   Ambulation/Gait  Yes    Ambulation/Gait Assistance  5: Supervision     Ambulation/Gait Assistance Details  --   cane too high but pt refuses to lower it   Ambulation Distance (Feet)  115 Feet   X2   Assistive device  Straight cane    Gait Pattern  Step-through pattern;Decreased arm swing - left;Decreased stride length;Decreased stance time - right;Decreased step length - left;Right genu recurvatum;Decreased weight shift to right;Lateral hip instability;Trunk flexed;Wide base of support    Ambulation Surface  Level;Indoor    Gait velocity  1.95 Ft/sec with SPC    Stairs  Yes    Stairs Assistance  5: Supervision    Stair Management Technique  Two rails;Step to pattern    Number of Stairs  4   X5   Height of Stairs  6      Berg Balance Test   Sit to Stand  Able to stand  independently using hands    Standing Unsupported  Able to stand 2 minutes with supervision    Sitting with Back Unsupported but Feet Supported on Floor or Stool  Able to sit safely and securely 2 minutes    Stand to Sit  Controls descent by  using hands    Transfers  Able to transfer safely, definite need of hands    Standing Unsupported with Eyes Closed  Able to stand 10 seconds with supervision    Standing Ubsupported with Feet Together  Able to place feet together independently and stand for 1 minute with supervision    From Standing, Reach Forward with Outstretched Arm  Reaches forward but needs supervision    From Standing Position, Pick up Object from [place] to pick up shoe, needs supervision    From Standing Position, Turn to Look Behind Over each Shoulder  Turn sideways only but maintains balance    Turn 360 Degrees  Needs close supervision or verbal cueing   very slow and unsteady but able to complete without assist   Standing Unsupported, Alternately Place Feet on Step/Stool  Able to complete >2 steps/needs minimal assist    Standing Unsupported, One Foot in Front  Needs help to step but can hold 15 seconds    Standing on One Leg  Unable to try or needs assist to prevent fall     Total Score  31                   [facility] Adult PT Treatment/Exercise - 02/18/18 0001      Knee/Hip Exercises: Aerobic   Other Aerobic  Sci fit x 10 mins at level 6 resistance while PT discussing goals, progress, deficits, and subjective             PT Education - 02/18/18 0909    Education Details  test findings    Person(s) Educated  Patient    Methods  Explanation    Comprehension  Verbalized understanding       PT Short Term Goals - 02/18/18 0915      PT SHORT TERM GOAL #1   Title  Pt will initiate HEP in order to indicate decreased fall risk and improved functional mobility. (Target Date: Following 3rd visit)    Baseline  relays now independent and shows good understanding of this today    Status  Achieved      PT SHORT TERM GOAL #2   Title  Pt will demonstrate safe technique using SPC vs RW in order to indicate decreased fall risk.     Baseline  using SPC mod I to supervision 115 ft X 2    Status  Achieved      PT SHORT TERM GOAL #3   Title  Will perform BERG in order to formally assess balance/fall risk.  LTG to follow    Baseline  score of 31    Status  Achieved        PT Long Term Goals - 02/18/18 0917      PT LONG TERM GOAL #1   Title  Pt will be independent with on-going HEP in order to indicate decreased fall risk and improved functional mobility.  (Target Date: Following 6th visit)    Baseline  compliant with initial HEP but will need progression as he progresses    Status  On-going      PT LONG TERM GOAL #2   Title  Pt will improve BERG balance test by 4 points from baseline in order to indicate decreased fall risk.     Baseline  improved 3 points    Status  Partially Met      PT LONG TERM GOAL #3   Title  Pt will improve gait speed to >/=2.40 ft/sec  w/ LRAD in order to indicate decreased fall risk.     Baseline  1.95 Ft/sec with SPC    Status  On-going      PT LONG TERM GOAL #4   Title  Pt will perform 5TSS in </=25 secs  without UE support in order to indicate decreased fall risk and improved functional strength.     Baseline  31.85 secs without UE support     Status  On-going      PT LONG TERM GOAL #5   Title  Will assess AFO needs and request order as appropriate to decrease fall risk.     Baseline  unable to assess due to time contraint     Status  On-going            Plan - 02/18/18 0911    Clinical Impression Statement  Pt is out of beginning authorized MCD visits and is starting new year so PT is submitting for additional PT visits. Retesting today showed some improvments in gait speed and balance tests however he has continued significant defecits in these and is a continued falls risk. He will continue to benefit from skilled PT to address his deficits in strength, endurance, balance and gait. He remains motivated with PT and has met his short term goals while slowly progressing toward long term goals. There are no barriers to progress at this time other than his chronic widespread pain but PT will continue to focus on improving function.    Rehab Potential  Good    PT Frequency  2x / week    PT Duration  4 weeks    PT Treatment/Interventions  ADLs/Self Care Home Management;Aquatic Therapy;Electrical Stimulation;DME Instruction;Gait training;Stair training;Functional mobility training;Therapeutic activities;Therapeutic exercise;Balance training;Neuromuscular re-education;Patient/family education;Orthotic Fit/Training;Passive range of motion;Dry needling;Vestibular    PT Next Visit Plan  Continue strength and balance.  Compliant surface gait/balance.    Consulted and Agree with Plan of Care  Patient       Patient will benefit from skilled therapeutic intervention in order to improve the following deficits and impairments:  Abnormal gait, Decreased activity tolerance, Decreased balance, Decreased coordination, Decreased endurance, Decreased knowledge of precautions, Decreased knowledge of use of DME,  Decreased mobility, Decreased safety awareness, Decreased strength, Impaired perceived functional ability, Impaired flexibility, Impaired sensation, Impaired UE functional use, Postural dysfunction  Visit Diagnosis: Other disturbances of skin sensation  Unsteadiness on feet  Other abnormalities of gait and mobility  Repeated falls     Problem List Patient Active Problem List   Diagnosis Date Noted  . Tobacco use disorder 02/15/2018  . Community acquired pneumonia 02/15/2018  . Essential hypertension 02/15/2018  . Poorly controlled type 2 diabetes mellitus with peripheral neuropathy ([place]) 02/15/2018  . Rheumatoid arthritis ([place]) 02/15/2018    Silvestre Mesi 02/18/2018, 9:19 AM  [facility] [address] [place], [place], [address] Phone: [phone]   Fax:  [phone]  Name: Casey Fye MRN: [id number] Date of Birth: 19-Mar-1960

## 2018-02-24 ENCOUNTER — Telehealth: Payer: Self-pay | Admitting: Neurology

## 2018-02-24 ENCOUNTER — Encounter: Payer: Self-pay | Admitting: Neurology

## 2018-02-24 ENCOUNTER — Encounter

## 2018-02-24 ENCOUNTER — Ambulatory Visit: Payer: Medicaid Other | Admitting: Neurology

## 2018-02-24 DIAGNOSIS — R202 Paresthesia of skin: Secondary | ICD-10-CM | POA: Diagnosis not present

## 2018-02-24 DIAGNOSIS — R531 Weakness: Secondary | ICD-10-CM | POA: Insufficient documentation

## 2018-02-24 DIAGNOSIS — G3281 Cerebellar ataxia in diseases classified elsewhere: Secondary | ICD-10-CM | POA: Diagnosis not present

## 2018-02-24 DIAGNOSIS — R269 Unspecified abnormalities of gait and mobility: Secondary | ICD-10-CM

## 2018-02-24 MED ORDER — GABAPENTIN 300 MG PO CAPS: 600.0000 mg | ORAL_CAPSULE | Freq: Three times a day (TID) | ORAL | 11 refills | Status: DC

## 2018-02-24 NOTE — Telephone Encounter (Signed)
Medicaid order sent to [facility] for pt to be aware. They obtain the auth and will reach out to the pt to schedule. I did leave [facility] phone number of [phone] to give them a call if he hasn't heard from them in the next 2-3 business days.
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
## 2018-03-04 ENCOUNTER — Ambulatory Visit: Payer: Medicaid Other | Admitting: Rehabilitation

## 2018-03-04 ENCOUNTER — Encounter: Payer: Self-pay | Admitting: Rehabilitation

## 2018-03-04 DIAGNOSIS — R2689 Other abnormalities of gait and mobility: Secondary | ICD-10-CM

## 2018-03-04 DIAGNOSIS — R208 Other disturbances of skin sensation: Secondary | ICD-10-CM

## 2018-03-04 DIAGNOSIS — R296 Repeated falls: Secondary | ICD-10-CM

## 2018-03-04 DIAGNOSIS — R2681 Unsteadiness on feet: Secondary | ICD-10-CM

## 2018-03-04 NOTE — Therapy (Signed)
[facility] [address] [place], [place], [address] Phone: [phone]   Fax:  [phone]  Physical Therapy Treatment  Patient Details  Name: Tony Ortega MRN: [id number] Date of Birth: May 28, 1960 Referring Provider (PT): [name], MD   Encounter Date: 03/04/2018  PT End of Session - 03/04/18 1028    Visit Number  5    Number of Visits  11   per updated POC   Date for PT Re-Evaluation  03/18/18    Authorization Type  Medicaid approved 3 visits from 1/6-2/5, then will plan to request for 2x/wk for 4 more weeks.     Authorization - Visit Number  2    Authorization - Number of Visits  3    PT Start Time  1017    PT Stop Time  1102    PT Time Calculation (min)  45 min    Equipment Utilized During Treatment  Gait belt    Activity Tolerance  Patient tolerated treatment well    Behavior During Therapy  WFL for tasks assessed/performed       Past Medical History:  Diagnosis Date  . Diabetes mellitus without complication ([place])   . Hypertension   . Neuropathy   . Rheumatoid arthritis [facility])     Past Surgical History:  Procedure Laterality Date  . KNEE SURGERY Right     There were no vitals filed for this visit.  Subjective Assessment - 03/04/18 1024    Subjective  Pt reports continued high pain in both feet and right hand. Reports that he is going to get nerve conduction and MRI done and also was given prescription for neurontin but has not filled it yet.     Pertinent History  DM II with peripheral neuropathy, RA, HTN     Limitations  Walking    Patient Stated Goals  "For me and you to be able to walk without the cane."     Currently in Pain?  Yes    Pain Score  9     Pain Location  Foot    Pain Orientation  Right;Left    Pain Descriptors / Indicators  Burning;Pins and needles    Pain Type  Chronic pain;Neuropathic pain    Pain Onset  More than a month ago    Pain Frequency  Constant    Aggravating  Factors   not taking medication    Pain Relieving Factors  sleeping                       [facility] Adult PT Treatment/Exercise - 03/04/18 1020      Ambulation/Gait   Ambulation/Gait  Yes    Ambulation/Gait Assistance  5: Supervision    Ambulation/Gait Assistance Details  Pt ambulatory into clinic with cane, again elevated too high, however did not want to let PT lower.  He did allow me to lower for a short time during session.  During gait with cane, he tends to swing trunk into overt rotation and drops on L hip during stance due to pain in L foot.  Pt places too much pressure on SPC and therefore educated on recommendations for use of RW.  Did have him use RW during session x 115' and note marked improvement in session.  Discussed how based on balance test score he should be using RW at all times and also how RW will help decrease pain in L foot until it can be better medically managed.  Pt verbalized understanding.     Ambulation Distance (Feet)  115 Feet   x 1 then 200 x 1   Assistive device  Rolling walker;Straight cane    Gait Pattern  Step-through pattern;Decreased arm swing - left;Decreased stride length;Decreased stance time - right;Decreased step length - left;Right genu recurvatum;Decreased weight shift to right;Lateral hip instability;Trunk flexed;Wide base of support    Ambulation Surface  Level;Indoor    Stairs  Yes    Stairs Assistance  5: Supervision    Stairs Assistance Details (indicate cue type and reason)  Pt wanting to work on stairs during session as he reports difficulty with stairs at home and does not have rail.  Performed x 4 reps during session ascending with LLE and then RLE with cane in which he demos ability to do at mod I level.  Upon descending, he initially descends with LLE and he had two mild LOB needing to lean against rail.  Then had him try to descend leading with RLE, with improvement noted, but still difficult due to LLE weakness.  Educated on  placement of cane (also had him lower cane during this task too to see if this would help).  He did not want to maintain cane at this height.     Stair Management Technique  No rails;Step to pattern;Forwards;With cane    Number of Stairs  4   x 4 reps   Height of Stairs  6      Self-Care   Self-Care  Other Self-Care Comments    Other Self-Care Comments   Pt continues to report increased pain in LLE.  PT could see that he has seen Dr. Krista Blue prior to this session.  He reports that he was put on "nerve medication" (Neurontin) but has not picked it up yet.  Discussed that this is to help alleviate nerve pain.  Also educated on good supportive shoes.  Continue to educate on examining feet due to neuropathy.  Pt reports he uses mirror to assess feet daily.        Exercises   Exercises  Knee/Hip      Knee/Hip Exercises: Seated   Long Arc Quad  Strengthening;Both;1 set;10 reps;2 sets    Long Arc Quad Weight  10 lbs.    Long [employer] Limitations  Had pt perform with 10 lb weight as he is doing at home.  Again, pt with overt trunk extension to compensate, therefore had him perform with blue theraband with improvement-added to HEP       Knee/Hip Exercises: Supine   Other Supine Knee/Hip Exercises  Supine knee to chest with 10 lb ankle weight as he is doing at home.  With cues for slower movement to improve muscle contraction, 10 lbs was too difficult, therefore had him perform with blue theraband and provided for HEP.         Access Code: [email or web address]  URL: [email or web address]  Date: 03/04/2018  Prepared by: Cameron Sprang   Exercises  Step Up - 10 reps - 2 sets - 2x daily - 7x weekly  Standing March with Counter Support - 10 reps - 2 sets - 2x daily - 7x weekly  Standing Hip Extension with Counter Support - 10 reps - 2 sets - 2x daily - 7x weekly  Heel rises with counter support - 10 reps - 2 sets - 2x daily - 7x weekly  Toe Raises with Counter Support - 10 reps - 2 sets - 2x daily -  7x weekly  Side Stepping with Counter Support - 10 reps - 2 sets - 2x daily - 7x weekly  Standing Balance with Eyes Closed - 3 reps - 1 sets - 20 hold - 1x daily - 7x weekly  Romberg Stance - 3 reps - 1 sets - 20 hold - 1x daily - 7x weekly  Seated Knee Extension with Resistance - 10 reps - 1 sets - 1x daily - 7x weekly  Supine Knee to Chest with Leg Straight - 10 reps - 1 sets - 1x daily - 7x weekly        PT Education - 03/04/18 1318    Education Details  see self care, additional HEP exercises    Person(s) Educated  Patient    Methods  Explanation;Demonstration;Handout    Comprehension  Verbalized understanding;Returned demonstration       PT Short Term Goals - 02/18/18 0915      PT SHORT TERM GOAL #1   Title  Pt will initiate HEP in order to indicate decreased fall risk and improved functional mobility. (Target Date: Following 3rd visit)    Baseline  relays now independent and shows good understanding of this today    Status  Achieved      PT SHORT TERM GOAL #2   Title  Pt will demonstrate safe technique using SPC vs RW in order to indicate decreased fall risk.     Baseline  using SPC mod I to supervision 115 ft X 2    Status  Achieved      PT SHORT TERM GOAL #3   Title  Will perform BERG in order to formally assess balance/fall risk.  LTG to follow    Baseline  score of 31    Status  Achieved        PT Long Term Goals - 02/18/18 0917      PT LONG TERM GOAL #1   Title  Pt will be independent with on-going HEP in order to indicate decreased fall risk and improved functional mobility.  (Target Date: Following 6th visit)    Baseline  compliant with initial HEP but will need progression as he progresses    Status  On-going      PT LONG TERM GOAL #2   Title  Pt will improve BERG balance test by 4 points from baseline in order to indicate decreased fall risk.     Baseline  improved 3 points    Status  Partially Met      PT LONG TERM GOAL #3   Title  Pt will improve  gait speed to >/=2.40 ft/sec w/ LRAD in order to indicate decreased fall risk.     Baseline  1.95 Ft/sec with SPC    Status  On-going      PT LONG TERM GOAL #4   Title  Pt will perform 5TSS in </=25 secs without UE support in order to indicate decreased fall risk and improved functional strength.     Baseline  31.85 secs without UE support     Status  On-going      PT LONG TERM GOAL #5   Title  Will assess AFO needs and request order as appropriate to decrease fall risk.     Baseline  unable to assess due to time contraint     Status  On-going            Plan - 03/04/18 1320    Clinical Impression Statement  Skilled session focused on addressing safety with  stairs without rail and with cane.  Educated on trying to use community resources to help with adding rail to house.  Also continue to address quality and safety of gait with SPC vs RW.  Based on BERG balance score and pain in L foot, PT recommends use of RW at all times.  Added two additional exercises to HEP as pt was doing with too heavy of a weight at home.     Rehab Potential  Good    PT Frequency  2x / week    PT Duration  4 weeks    PT Treatment/Interventions  ADLs/Self Care Home Management;Aquatic Therapy;Electrical Stimulation;DME Instruction;Gait training;Stair training;Functional mobility training;Therapeutic activities;Therapeutic exercise;Balance training;Neuromuscular re-education;Patient/family education;Orthotic Fit/Training;Passive range of motion;Dry needling;Vestibular    PT Next Visit Plan  Assess LTGs and request for 2x/wk for 4 more weeks, continue with gait, balance, compliant sufaces     Consulted and Agree with Plan of Care  Patient       Patient will benefit from skilled therapeutic intervention in order to improve the following deficits and impairments:  Abnormal gait, Decreased activity tolerance, Decreased balance, Decreased coordination, Decreased endurance, Decreased knowledge of precautions,  Decreased knowledge of use of DME, Decreased mobility, Decreased safety awareness, Decreased strength, Impaired perceived functional ability, Impaired flexibility, Impaired sensation, Impaired UE functional use, Postural dysfunction  Visit Diagnosis: Other disturbances of skin sensation  Unsteadiness on feet  Other abnormalities of gait and mobility  Repeated falls     Problem List Patient Active Problem List   Diagnosis Date Noted  . Paresthesia 02/24/2018  . Gait abnormality 02/24/2018  . Weakness 02/24/2018  . Tobacco use disorder 02/15/2018  . Community acquired pneumonia 02/15/2018  . Essential hypertension 02/15/2018  . Poorly controlled type 2 diabetes mellitus with peripheral neuropathy ([place]) 02/15/2018  . Rheumatoid arthritis ([place]) 02/15/2018    Cameron Sprang, PT, MPT [facility] [address] [place], [place], [address] Phone: [phone]   Fax:  [phone] 03/04/18, 1:24 PM  Name: Tony Ortega MRN: [id number] Date of Birth: 1960-12-20

## 2018-03-04 NOTE — Patient Instructions (Signed)
Access Code: [email or web address]  URL: [email or web address]  Date: 03/04/2018  Prepared by: Cameron Sprang   Exercises  Step Up - 10 reps - 2 sets - 2x daily - 7x weekly  Standing March with Counter Support - 10 reps - 2 sets - 2x daily - 7x weekly  Standing Hip Extension with Counter Support - 10 reps - 2 sets - 2x daily - 7x weekly  Heel rises with counter support - 10 reps - 2 sets - 2x daily - 7x weekly  Toe Raises with Counter Support - 10 reps - 2 sets - 2x daily - 7x weekly  Side Stepping with Counter Support - 10 reps - 2 sets - 2x daily - 7x weekly  Standing Balance with Eyes Closed - 3 reps - 1 sets - 20 hold - 1x daily - 7x weekly  Romberg Stance - 3 reps - 1 sets - 20 hold - 1x daily - 7x weekly  Seated Knee Extension with Resistance - 10 reps - 1 sets - 1x daily - 7x weekly  Supine Knee to Chest with Leg Straight - 10 reps - 1 sets - 1x daily - 7x weekly

## 2018-03-14 ENCOUNTER — Ambulatory Visit: Payer: Medicaid Other | Attending: Family Medicine | Admitting: Rehabilitation

## 2018-03-14 ENCOUNTER — Encounter: Payer: Self-pay | Admitting: Rehabilitation

## 2018-03-14 DIAGNOSIS — R296 Repeated falls: Secondary | ICD-10-CM | POA: Diagnosis present

## 2018-03-14 DIAGNOSIS — R2689 Other abnormalities of gait and mobility: Secondary | ICD-10-CM

## 2018-03-14 DIAGNOSIS — R208 Other disturbances of skin sensation: Secondary | ICD-10-CM | POA: Diagnosis present

## 2018-03-14 DIAGNOSIS — R2681 Unsteadiness on feet: Secondary | ICD-10-CM | POA: Diagnosis present

## 2018-03-14 NOTE — Therapy (Signed)
[facility] [address] [place], [place], [address] Phone: [phone]   Fax:  [phone]  Physical Therapy Treatment  Patient Details  Name: Tony Ortega MRN: [id number] Date of Birth: [birth year] Referring Provider (PT): [name], MD   Encounter Date: 03/14/2018  PT End of Session - 03/14/18 1526    Visit Number  6    Number of Visits  19   per updated POC   Date for PT Re-Evaluation  04/28/18   per updated POC   Authorization Type  Medicaid approved 3 visits from 1/6-2/5, then will plan to request for 2x/wk for 4 more weeks.     Authorization - Visit Number  3    Authorization - Number of Visits  3    PT Start Time  3785    PT Stop Time  1230    PT Time Calculation (min)  41 min    Equipment Utilized During Treatment  Gait belt    Activity Tolerance  Patient tolerated treatment well    Behavior During Therapy  WFL for tasks assessed/performed       Past Medical History:  Diagnosis Date  . Diabetes mellitus without complication ([place])   . Hypertension   . Neuropathy   . Rheumatoid arthritis [facility])     Past Surgical History:  Procedure Laterality Date  . KNEE SURGERY Right     There were no vitals filed for this visit.  Subjective Assessment - 03/14/18 1152    Subjective  Pt reports some relief with neurontin, however its making him sleepy so only taking 2 times per day.     Pertinent History  DM II with peripheral neuropathy, RA, HTN     Limitations  Walking    Patient Stated Goals  "For me and you to be able to walk without the cane."     Currently in Pain?  Yes    Pain Score  8     Pain Location  Foot    Pain Orientation  Right;Left    Pain Descriptors / Indicators  Burning;Pins and needles    Pain Type  Neuropathic pain;Chronic pain    Pain Onset  More than a month ago    Pain Frequency  Constant    Aggravating Factors   not taking medication    Pain Relieving Factors  sleeping                         [facility] Adult PT Treatment/Exercise - 03/14/18 1200      Transfers   Transfers  Sit to Stand;Stand to Sit    Sit to Stand  6: Modified independent (Device/Increase time)    Five time sit to stand comments   37.31 secs without UE support with marked unsteadiness, inconsistent with other tasks during session.     Stand to Sit  6: Modified independent (Device/Increase time)      Ambulation/Gait   Ambulation/Gait  Yes    Ambulation/Gait Assistance  6: Modified independent (Device/Increase time);5: Supervision    Ambulation/Gait Assistance Details  Pt continues to ambulate with SPC elevated in higher than appropriate position.  Also continues to demonstrate poor postural alignment, with difficulty lifing and swinging RLE through swing phase of gait.  Discussed possible need/assessment for RLE bracing to ensure safety and prevent falls.  Will assess in future sessions.     Ambulation Distance (Feet)  50 Feet   x 2, then 55'  Assistive device  Straight cane    Gait Pattern  Step-through pattern;Decreased arm swing - left;Decreased stride length;Decreased stance time - right;Decreased step length - left;Right genu recurvatum;Decreased weight shift to right;Lateral hip instability;Trunk flexed;Wide base of support    Ambulation Surface  Level;Indoor    Gait velocity  1.53 ft/sec with [facility]      Standardized Balance Assessment   Standardized Balance Assessment  Berg Balance Test      Berg Balance Test   Sit to Stand  Able to stand without using hands and stabilize independently    Standing Unsupported  Able to stand 2 minutes with supervision    Sitting with Back Unsupported but Feet Supported on Floor or Stool  Able to sit safely and securely 2 minutes    Stand to Sit  Sits safely with minimal use of hands    Transfers  Able to transfer safely, minor use of hands    Standing Unsupported with Eyes Closed  Able to stand 10 seconds with supervision    Standing  Ubsupported with Feet Together  Able to place feet together independently but unable to hold for 30 seconds    From Standing, Reach Forward with Outstretched Arm  Can reach forward >12 cm safely (5")    From Standing Position, Pick up Object from Floor  Able to pick up shoe, needs supervision    From Standing Position, Turn to Look Behind Over each Shoulder  Needs supervision when turning    Turn 360 Degrees  Needs close supervision or verbal cueing    Standing Unsupported, Alternately Place Feet on Step/Stool  Able to complete 4 steps without aid or supervision    Standing Unsupported, One Foot in Front  Able to plae foot ahead of the other independently and hold 30 seconds    Standing on One Leg  Tries to lift leg/unable to hold 3 seconds but remains standing independently    Total Score  38      Self-Care   Self-Care  Other Self-Care Comments    Other Self-Care Comments   Discussed having pt come to therapy for 2x/wk for 4 more weeks to address deficits, but would likely not continue after that in order for pt to keep some visits for later in the year.  Pt verbalized understanding.          Self Care:  Continue to educate on recommendation to use RW based on balance deficits and score on BERG balance. Pt continues to refuse use of RW.       PT Short Term Goals - 02/18/18 0915      PT SHORT TERM GOAL #1   Title  Pt will initiate HEP in order to indicate decreased fall risk and improved functional mobility. (Target Date: Following 3rd visit)    Baseline  relays now independent and shows good understanding of this today    Status  Achieved      PT SHORT TERM GOAL #2   Title  Pt will demonstrate safe technique using SPC vs RW in order to indicate decreased fall risk.     Baseline  using SPC mod I to supervision 115 ft X 2    Status  Achieved      PT SHORT TERM GOAL #3   Title  Will perform BERG in order to formally assess balance/fall risk.  LTG to follow    Baseline  score of 31     Status  Achieved  PT Long Term Goals - 03/14/18 1156      PT LONG TERM GOAL #1   Title  Pt will be independent with on-going HEP in order to indicate decreased fall risk and improved functional mobility.  (Target Date: Following 6th visit)    Baseline  met per verbal report on 03/14/18    Status  Achieved      PT LONG TERM GOAL #2   Title  Pt will improve BERG balance test by 4 points from baseline in order to indicate decreased fall risk.     Baseline  31/56 was baseline improved to 38/56 on 03/14/18    Status  Achieved      PT LONG TERM GOAL #3   Title  Pt will improve gait speed to >/=2.40 ft/sec w/ LRAD in order to indicate decreased fall risk.     Baseline  1.95 Ft/sec with SPC to 1.53 ft/sec with SPC on 03/14/18    Status  Not Met      PT LONG TERM GOAL #4   Title  Pt will perform 5TSS in </=25 secs without UE support in order to indicate decreased fall risk and improved functional strength.     Baseline  31.85 secs without UE support  to 37.31 secs without UE support on 03/14/18    Status  Not Met      PT LONG TERM GOAL #5   Title  Will assess AFO needs and request order as appropriate to decrease fall risk.     Baseline  unable to assess due to time contraint     Status  Unable to assess          PT Long Term Goals - 03/14/18 1156      PT LONG TERM GOAL #1   Title  Pt will be independent with on-going HEP in order to indicate decreased fall risk and improved functional mobility.  (Target Date: Following 8th visit)    Baseline  met per verbal report on 03/14/18    Time  4    Period  Weeks    Status  On-going      PT LONG TERM GOAL #2   Title  Pt will improve BERG balance test score to 42/56 in order to indicate decreased fall risk.     Baseline  31/56 was baseline improved to 38/56 on 03/14/18    Time  4    Period  Weeks    Status  Revised      PT LONG TERM GOAL #3   Title  Pt will improve gait speed to >/=2.40 ft/sec w/ LRAD in order to indicate decreased  fall risk.     Baseline  1.95 Ft/sec with SPC to 1.53 ft/sec with SPC on 03/14/18    Status  On-going      PT LONG TERM GOAL #4   Title  Pt will perform 5TSS in </=25 secs without UE support in order to indicate decreased fall risk and improved functional strength.     Baseline  31.85 secs without UE support  to 37.31 secs without UE support on 03/14/18    Status  On-going      PT LONG TERM GOAL #5   Title  Will assess AFO needs and request order as appropriate to decrease fall risk.     Baseline  unable to assess due to time contraint     Status  On-going          Plan - 03/14/18  1511    Clinical Impression Statement  Skilled session focused on assessment of LTGs.  Pt has met 2/5 LTGs, but has actually declined with both gait speed and 5TSS times.  PT has not had chance to assess for need for RLE AFO, however due to continued deficits would like to address this in future session.  Will plan to ask for 2x/wk for 4 more weeks in order to address deficits.     Rehab Potential  Good    PT Frequency  2x / week    PT Duration  4 weeks    PT Treatment/Interventions  ADLs/Self Care Home Management;Aquatic Therapy;Electrical Stimulation;DME Instruction;Gait training;Stair training;Functional mobility training;Therapeutic activities;Therapeutic exercise;Balance training;Neuromuscular re-education;Patient/family education;Orthotic Fit/Training;Passive range of motion;Dry needling;Vestibular    PT Next Visit Plan  Assess need for R AFO, continue with gait, balance, compliant sufaces     Consulted and Agree with Plan of Care  Patient       Patient will benefit from skilled therapeutic intervention in order to improve the following deficits and impairments:  Abnormal gait, Decreased activity tolerance, Decreased balance, Decreased coordination, Decreased endurance, Decreased knowledge of precautions, Decreased knowledge of use of DME, Decreased mobility, Decreased safety awareness, Decreased strength,  Impaired perceived functional ability, Impaired flexibility, Impaired sensation, Impaired UE functional use, Postural dysfunction  Visit Diagnosis: Other disturbances of skin sensation  Unsteadiness on feet  Other abnormalities of gait and mobility  Repeated falls     Problem List Patient Active Problem List   Diagnosis Date Noted  . Paresthesia 02/24/2018  . Gait abnormality 02/24/2018  . Weakness 02/24/2018  . Tobacco use disorder 02/15/2018  . Community acquired pneumonia 02/15/2018  . Essential hypertension 02/15/2018  . Poorly controlled type 2 diabetes mellitus with peripheral neuropathy ([place]) 02/15/2018  . Rheumatoid arthritis ([place]) 02/15/2018    Cameron Sprang, PT, MPT [facility] [address] [place], [place], [address] Phone: [phone]   Fax:  [phone] 03/14/18, 3:29 PM  Name: Tony Ortega MRN: [id number] Date of Birth: March 13, 1960

## 2018-03-17 ENCOUNTER — Encounter: Payer: Self-pay | Admitting: Podiatry

## 2018-03-17 ENCOUNTER — Ambulatory Visit: Payer: Medicaid Other | Admitting: Podiatry

## 2018-03-17 DIAGNOSIS — B353 Tinea pedis: Secondary | ICD-10-CM | POA: Diagnosis not present

## 2018-03-17 DIAGNOSIS — M79674 Pain in right toe(s): Secondary | ICD-10-CM | POA: Diagnosis not present

## 2018-03-17 DIAGNOSIS — M79675 Pain in left toe(s): Secondary | ICD-10-CM

## 2018-03-17 DIAGNOSIS — E1142 Type 2 diabetes mellitus with diabetic polyneuropathy: Secondary | ICD-10-CM

## 2018-03-17 DIAGNOSIS — B351 Tinea unguium: Secondary | ICD-10-CM

## 2018-03-17 MED ORDER — KETOCONAZOLE 2 % EX CREA: 1.0000 "application " | TOPICAL_CREAM | Freq: Every day | CUTANEOUS | 1 refills | Status: DC

## 2018-03-17 NOTE — Patient Instructions (Addendum)
Onychomycosis/Fungal Toenails  WHAT IS IT? An infection that lies within the keratin of your nail plate that is caused by a fungus.  WHY ME? Fungal infections affect all ages, sexes, races, and creeds.  There may be many factors that predispose you to a fungal infection such as age, coexisting medical conditions such as diabetes, or an autoimmune disease; stress, medications, fatigue, genetics, etc.  Bottom line: fungus thrives in a warm, moist environment and your shoes offer such a location.  IS IT CONTAGIOUS? Theoretically, yes.  You do not want to share shoes, nail clippers or files with someone who has fungal toenails.  Walking around barefoot in the same room or sleeping in the same bed is unlikely to transfer the organism.  It is important to realize, however, that fungus can spread easily from one nail to the next on the same foot.  HOW DO WE TREAT THIS?  There are several ways to treat this condition.  Treatment may depend on many factors such as age, medications, pregnancy, liver and kidney conditions, etc.  It is best to ask your doctor which options are available to you.  1. No treatment.   Unlike many other medical concerns, you can live with this condition.  However for many people this can be a painful condition and may lead to ingrown toenails or a bacterial infection.  It is recommended that you keep the nails cut short to help reduce the amount of fungal nail. 2. Topical treatment.  These range from herbal remedies to prescription strength nail lacquers.  About 40-50% effective, topicals require twice daily application for approximately 9 to 12 months or until an entirely new nail has grown out.  The most effective topicals are medical grade medications available through physicians offices. 3. Oral antifungal medications.  With an 80-90% cure rate, the most common oral medication requires 3 to 4 months of therapy and stays in your system for a year as the new nail grows out.  Oral  antifungal medications do require blood work to make sure it is a safe drug for you.  A liver function panel will be performed prior to starting the medication and after the first month of treatment.  It is important to have the blood work performed to avoid any harmful side effects.  In general, this medication safe but blood work is required. 4. Laser Therapy.  This treatment is performed by applying a specialized laser to the affected nail plate.  This therapy is noninvasive, fast, and non-painful.  It is not covered by insurance and is therefore, out of pocket.  The results have been very good with a 80-95% cure rate.  [facility] is the only practice in the area to offer this therapy. Permanent Nail Avulsion.  Removing the entire nail so that a new nail will not grow back.Diabetic Neuropathy Diabetic neuropathy refers to nerve damage that is caused by diabetes (diabetes mellitus). Over time, people with diabetes can develop nerve damage throughout the body. There are several types of diabetic neuropathy:  Peripheral neuropathy. This is the most common type of diabetic neuropathy. It causes damage to nerves that carry signals between the spinal cord and other parts of the body (peripheral nerves). This usually affects nerves in the feet and legs first, and may eventually affect the hands and arms. The damage affects the ability to sense touch or temperature.  Autonomic neuropathy. This type causes damage to nerves that control involuntary functions (autonomic nerves). These nerves carry   signals that control: ? Heartbeat. ? Body temperature. ? Blood pressure. ? Urination. ? Digestion. ? Sweating. ? Sexual function. ? Response to changing blood sugar (glucose) levels.  Focal neuropathy. This type of nerve damage affects one area of the body, such as an arm, a leg, or the face. The injury may involve one nerve or a small group of nerves. Focal neuropathy can be painful and unpredictable,  and occurs most often in older adults with diabetes. This often develops suddenly, but usually improves over time and does not cause long-term problems.  Proximal neuropathy. This type of nerve damage affects the nerves of the thighs, hips, buttocks, or legs. It causes severe pain, weakness, and muscle death (atrophy), usually in the thigh muscles. It is more common among older men and people who have type 2 diabetes. The length of recovery time may vary. What are the causes? Peripheral, autonomic, and focal neuropathies are caused by diabetes that is not well controlled with treatment. The cause of proximal neuropathy is not known, but it may be caused by inflammation related to uncontrolled blood glucose levels. What are the signs or symptoms? Peripheral neuropathy Peripheral neuropathy develops slowly over time. When the nerves of the feet and legs no longer work, you may experience:  Burning, stabbing, or aching pain in the legs or feet.  Pain or cramping in the legs or feet.  Loss of feeling (numbness) and inability to feel pressure or pain in the feet. This can lead to: ? Thick calluses or sores on areas of constant pressure. ? Ulcers. ? Reduced ability to feel temperature changes.  Foot deformities.  Muscle weakness.  Loss of balance or coordination. Autonomic neuropathy The symptoms of autonomic neuropathy vary depending on which nerves are affected. Symptoms may include:  Problems with digestion, such as: ? Nausea or vomiting. ? Poor appetite. ? Bloating. ? Diarrhea or constipation. ? Trouble swallowing. ? Losing weight without trying to.  Problems with the heart, blood and lungs, such as: ? Dizziness, especially when standing up. ? Fainting. ? Shortness of breath. ? Irregular heartbeat.  Bladder problems, such as: ? Trouble starting or stopping urination. ? Leaking urine. ? Trouble emptying the bladder. ? Urinary tract infections (UTIs).  Problems with other  body functions, such as: ? Sweat. You may sweat too much or too little. ? Temperature. You might get hot easily. Or, you might feel cold more than usual. ? Sexual function. Men may not be able to get or maintain an erection. Women may have vaginal dryness and difficulty with arousal. Focal neuropathy Symptoms affect only one area of the body. Common symptoms include:  Numbness.  Tingling.  Burning pain.  Prickling feeling.  Very sensitive skin.  Weakness.  Inability to move (paralysis).  Muscle twitching.  Muscles getting smaller (wasting).  Poor coordination.  Double or blurred vision. Proximal neuropathy  Sudden, severe pain in the hip, thigh, or buttocks. Pain may spread from the back into the legs (sciatica).  Pain and numbness in the arms and legs.  Tingling.  Loss of bladder control or bowel control.  Weakness and wasting of thigh muscles.  Difficulty getting up from a seated position.  Abdominal swelling.  Unexplained weight loss. How is this diagnosed? Diagnosis usually involves reviewing your medical history and any symptoms you have. Diagnosis varies depending on the type of neuropathy your health care provider suspects. Peripheral neuropathy Your health care provider will check areas that are affected by your nervous system (neurologic exam), such as  your reflexes, how you move, and what you can feel. You may have other tests, such as:  Blood tests.  Removal and examination of fluid that surrounds the spinal cord (lumbar puncture).  CT scan.  MRI.  A test to check the nerves that control muscles (electromyogram, EMG).  Tests of how quickly messages pass through your nerves (nerve conduction velocity tests).  Removal of a small piece of nerve to be examined under a microscope (biopsy). Autonomic neuropathy You may have tests, such as:  Tests to measure your blood pressure and heart rate. This may include monitoring you while you are safely  secured to an exam table that moves you from a lying position to an upright position (table tilt test).  Breathing tests to check your lungs.  Tests to check how food moves through the digestive system (gastric emptying tests).  Blood, sweat, or urine tests.  Ultrasound of your bladder.  Spinal fluid tests. Focal neuropathy This condition may be diagnosed with:  A neurologic exam.  CT scan.  MRI.  EMG.  Nerve conduction velocity tests. Proximal neuropathy There is no test to diagnose this type of neuropathy. You may have tests to rule out other possible causes of this type of neuropathy. Tests may include:  X-rays of your spine and lumbar region.  Lumbar puncture.  MRI. How is this treated? The goal of treatment is to keep nerve damage from getting worse. The most important part of treatment is keeping your blood glucose level and your A1C level within your target range by following your diabetes management plan. Over time, maintaining lower blood glucose levels helps lessen symptoms. In some cases, you may need prescription pain medicine. Follow these instructions at home:  Lifestyle   Do not use any products that contain nicotine or tobacco, such as cigarettes and e-cigarettes. If you need help quitting, ask your health care provider.  Be physically active every day. Include strength training and balance exercises.  Follow a healthy meal plan.  Work with your health care provider to manage your blood pressure. General instructions  Follow your diabetes management plan as directed. ? Check your blood glucose levels as directed by your health care provider. ? Keep your blood glucose in your target range as directed by your health care provider. ? Have your A1C level checked at least two times a year, or as often as told by your health care provider.  Take over the counter and prescription medicines only as told by your health care provider. This includes insulin  and diabetes medicine.  Do not drive or use heavy machinery while taking prescription pain medicines.  Check your skin and feet every day for cuts, bruises, redness, blisters, or sores.  Keep all follow up visits as told by your health care provider. This is important. Contact a health care provider if:  You have burning, stabbing, or aching pain in your legs or feet.  You are unable to feel pressure or pain in your feet.  You develop problems with digestion, such as: ? Nausea. ? Vomiting. ? Bloating. ? Constipation. ? Diarrhea. ? Abdominal pain.  You have difficulty with urination, such as inability: ? To control when you urinate (incontinence). ? To completely empty the bladder (retention).  You have palpitations.  You feel dizzy, weak, or faint when you stand up. Get help right away if:  You cannot urinate.  You have sudden weakness or loss of coordination.  You have trouble speaking.  You have pain or   pressure in your chest.  You have an irregular heart beat.  You have sudden inability to move a part of your body. Summary  Diabetic neuropathy refers to nerve damage that is caused by diabetes. It can affect nerves throughout the entire body, causing numbness and pain in the arms, legs, digestive tract, heart, and other body systems.  Keep your blood glucose level and your blood pressure in your target range, as directed by your health care provider. This can help prevent neuropathy from getting worse.  Check your skin and feet every day for cuts, bruises, redness, blisters, or sores.  Do not use any products that contain nicotine or tobacco, such as cigarettes and e-cigarettes. If you need help quitting, ask your health care provider. This information is not intended to replace advice given to you by your health care provider. Make sure you discuss any questions you have with your health care provider. Document Released: 04/06/2001 Document Revised: 03/10/2017  Document Reviewed: 03/02/2016 Elsevier Interactive Patient Education  2019 [place]  Athlete's foot (tinea pedis) is a fungal infection of the skin on your feet. It often occurs on the skin that is between or underneath the toes. It can also occur on the soles of your feet. The infection can spread from person to person (is contagious). It can also spread when a person's bare feet come in contact with the fungus on shower floors or on items such as shoes. What are the causes? This condition is caused by a fungus that grows in warm, moist places. You can get athlete's foot by sharing shoes, shower stalls, towels, and wet floors with someone who is infected. Not washing your feet or changing your socks often enough can also lead to athlete's foot. What increases the risk? This condition is more likely to develop in:  Men.  People who have a weak body defense system (immune system).  People who have diabetes.  People who use public showers, such as at a gym.  People who wear heavy-duty shoes, such as Environmental manager.  Seasons with warm, humid weather. What are the signs or symptoms? Symptoms of this condition include:  Itchy areas between your toes or on the soles of your feet.  White, flaky, or scaly areas between your toes or on the soles of your feet.  Very itchy small blisters between your toes or on the soles of your feet.  Small cuts in your skin. These cuts can become infected.  Thick or discolored toenails. How is this diagnosed? This condition may be diagnosed with a physical exam and a review of your medical history. Your health care provider may also take a skin or toenail sample to examine under a microscope. How is this treated? This condition is treated with antifungal medicines. These may be applied as powders, ointments, or creams. In severe cases, an oral antifungal medicine may be given. Follow these instructions at  home: Medicines  Apply or take over-the-counter and prescription medicines only as told by your health care provider.  Apply your antifungal medicine as told by your health care provider. Do not stop using the antifungal even if your condition improves. Foot care  Do not scratch your feet.  Keep your feet dry: ? Wear cotton or wool socks. Change your socks every day or if they become wet. ? Wear shoes that allow air to flow, such as sandals or canvas tennis shoes.  Wash and dry your feet, including the area between  your toes. Also, wash and dry your feet: ? Every day or as told by your health care provider. ? After exercising. General instructions  Do not let others use towels, shoes, nail clippers, or other personal items that touch your feet.  Protect your feet by wearing sandals in wet areas, such as locker rooms and shared showers.  Keep all follow-up visits as told by your health care provider. This is important.  If you have diabetes, keep your blood sugar under control. Contact a health care provider if:  You have a fever.  You have swelling, soreness, warmth, or redness in your foot.  Your feet are not getting better with treatment.  Your symptoms get worse.  You have new symptoms. Summary  Athlete's foot (tinea pedis) is a fungal infection of the skin on your feet. It often occurs on skin that is between or underneath the toes.  This condition is caused by a fungus that grows in warm, moist places.  Symptoms include white, flaky, or scaly areas between your toes or on the soles of your feet.  This condition is treated with antifungal medicines.  Keep your feet clean. Always dry them thoroughly. This information is not intended to replace advice given to you by your health care provider. Make sure you discuss any questions you have with your health care provider. Document Released: 01/24/2000 Document Revised: 11/16/2016 Document Reviewed: 11/16/2016 Elsevier  Interactive Patient Education  2019 [employer].

## 2018-03-17 NOTE — Progress Notes (Signed)
Subjective: Tony Ortega presents today with history of neuropathy with cc of painful, mycotic toenails.  Pain is aggravated when wearing enclosed shoe gear and relieved with periodic professional debridement.  Patient has peripheral neuropathy managed with Elavil.  He relates his neuropathic pain has not been controlled on the current dosage of Elavil.  He admits to taking an extra pill. which resulted in him being drowsy.    Tony Blackbird, MD is his PCP.  Last visit was January 10, 2018    Allergies  Allergen Reactions  . Pork-Derived Products Other (See Comments)    No pork products    Objective:  Vascular Examination: Capillary refill time immediate x 10 digits Dorsalis pedis and Posterior tibial pulses palpable bilaterally Digital hair x 10 digits was sparse Skin temperature gradient WNL b/l  Dermatological Examination: Skin with normal turgor, texture and tone b/l  Toenails 1-5 b/l discolored, thick, dystrophic with subungual debris and pain with palpation to nailbeds due to thickness of nails.  Diffuse scaling noted peripherally and plantarly b/l feet with mild foot odor.  No interdigital macerations.  No blisters, no weeping. No signs of secondary bacterial infection noted.  Musculoskeletal: Muscle strength 5/5 to all muscle groups b/l  HAV with bunion deformity bilaterally  Neurological: Sensation with 10 gram monofilament is absent b/l Vibratory sensation absent b/l  Assessment: 1. Painful onychomycosis toenails 1-5 b/l 2. Tinea pedis bilaterally 3. NIDDM with neuropathy  Plan: 1. Toenails 1-5 b/l were debrided in length and girth without iatrogenic bleeding. Discussed tinea pedis and treatment options.  For tinea pedis, prescription sent to pharmacy for Ketoconazole Cream 2% to be applied to both feet and between toes qd x 6 weeks. 2. Patient to continue soft, supportive shoe gear 3. Patient to report any pedal injuries to medical professional  4. Follow  up 3 months. Patient/POA to call should there be a concern in the interim.

## 2018-03-21 ENCOUNTER — Ambulatory Visit: Payer: Medicaid Other | Attending: Family Medicine | Admitting: Family Medicine

## 2018-03-21 ENCOUNTER — Other Ambulatory Visit: Payer: Self-pay

## 2018-03-21 ENCOUNTER — Encounter: Payer: Self-pay | Admitting: Family Medicine

## 2018-03-21 VITALS — BP 130/79 | HR 75 | Temp 98.6°F | Resp 18 | Ht 66.0 in | Wt 144.0 lb

## 2018-03-21 DIAGNOSIS — Z79899 Other long term (current) drug therapy: Secondary | ICD-10-CM | POA: Diagnosis not present

## 2018-03-21 DIAGNOSIS — M069 Rheumatoid arthritis, unspecified: Secondary | ICD-10-CM | POA: Diagnosis not present

## 2018-03-21 DIAGNOSIS — Z8249 Family history of ischemic heart disease and other diseases of the circulatory system: Secondary | ICD-10-CM | POA: Insufficient documentation

## 2018-03-21 DIAGNOSIS — E1165 Type 2 diabetes mellitus with hyperglycemia: Secondary | ICD-10-CM

## 2018-03-21 DIAGNOSIS — Z7984 Long term (current) use of oral hypoglycemic drugs: Secondary | ICD-10-CM | POA: Insufficient documentation

## 2018-03-21 DIAGNOSIS — E1142 Type 2 diabetes mellitus with diabetic polyneuropathy: Secondary | ICD-10-CM

## 2018-03-21 DIAGNOSIS — B353 Tinea pedis: Secondary | ICD-10-CM

## 2018-03-21 DIAGNOSIS — I10 Essential (primary) hypertension: Secondary | ICD-10-CM | POA: Diagnosis not present

## 2018-03-21 DIAGNOSIS — Z833 Family history of diabetes mellitus: Secondary | ICD-10-CM | POA: Insufficient documentation

## 2018-03-21 DIAGNOSIS — E785 Hyperlipidemia, unspecified: Secondary | ICD-10-CM | POA: Diagnosis not present

## 2018-03-21 DIAGNOSIS — F17210 Nicotine dependence, cigarettes, uncomplicated: Secondary | ICD-10-CM | POA: Insufficient documentation

## 2018-03-21 DIAGNOSIS — E1169 Type 2 diabetes mellitus with other specified complication: Secondary | ICD-10-CM | POA: Diagnosis not present

## 2018-03-21 DIAGNOSIS — Z91018 Allergy to other foods: Secondary | ICD-10-CM | POA: Diagnosis not present

## 2018-03-21 LAB — GLUCOSE, POCT (MANUAL RESULT ENTRY): POC Glucose: 158 mg/dL — AB (ref 70–99)

## 2018-03-21 MED ORDER — GABAPENTIN 300 MG PO CAPS: 600.0000 mg | ORAL_CAPSULE | Freq: Three times a day (TID) | ORAL | 11 refills | Status: DC

## 2018-03-21 MED ORDER — ATORVASTATIN CALCIUM 20 MG PO TABS: 20.0000 mg | ORAL_TABLET | Freq: Every day | ORAL | 6 refills | Status: DC

## 2018-03-21 MED ORDER — METFORMIN HCL 1000 MG PO TABS: 1000.0000 mg | ORAL_TABLET | Freq: Two times a day (BID) | ORAL | 6 refills | Status: DC

## 2018-03-21 MED ORDER — GLUCOSE BLOOD VI STRP: ORAL_STRIP | 12 refills | Status: DC

## 2018-03-21 MED ORDER — ACCU-CHEK GUIDE W/DEVICE KIT: 1.0000 | PACK | Freq: Two times a day (BID) | 0 refills | Status: DC

## 2018-03-21 MED ORDER — HYDROCHLOROTHIAZIDE 25 MG PO TABS: 25.0000 mg | ORAL_TABLET | Freq: Every day | ORAL | 6 refills | Status: DC

## 2018-03-21 MED ORDER — ACCU-CHEK FASTCLIX LANCETS MISC: 12 refills | Status: AC

## 2018-03-21 MED ORDER — KETOCONAZOLE 2 % EX CREA: 1.0000 "application " | TOPICAL_CREAM | Freq: Every day | CUTANEOUS | 1 refills | Status: DC

## 2018-03-21 MED ORDER — AMLODIPINE BESYLATE 5 MG PO TABS: 5.0000 mg | ORAL_TABLET | Freq: Every day | ORAL | 6 refills | Status: DC

## 2018-03-21 MED ORDER — AMITRIPTYLINE HCL 25 MG PO TABS: 25.0000 mg | ORAL_TABLET | Freq: Every day | ORAL | 6 refills | Status: DC

## 2018-03-21 MED ORDER — ACCU-CHEK SOFT TOUCH LANCETS MISC: 12 refills | Status: DC

## 2018-03-21 MED FILL — KETOCONAZOLE 2% CREAM: 2 | 15 days supply | Qty: 30 | Fill #0

## 2018-03-21 MED FILL — ACCU-CHEK GUIDE W/DEVICE KI: W/DEVICE | 30 days supply | Qty: 1 | Fill #0

## 2018-03-21 MED FILL — metFORMIN HCL 1000 MG TABS: 1000 | 30 days supply | Qty: 60 | Fill #0

## 2018-03-21 MED FILL — HYDROCHLOROTHIAZIDE 25 MG T: 25 | 30 days supply | Qty: 30 | Fill #0

## 2018-03-21 MED FILL — ACCU-CHEK GUIDE TEST STRIP: 30 days supply | Qty: 100 | Fill #0

## 2018-03-21 MED FILL — ACCU-CHEK FASTCLIX LANCETS: 30 days supply | Qty: 102 | Fill #0

## 2018-03-21 MED FILL — ATORVASTATIN 20 MG TABLET: 20 | 30 days supply | Qty: 30 | Fill #0

## 2018-03-21 MED FILL — GABAPENTIN 300 MG CAPSULE: 300 | 30 days supply | Qty: 180 | Fill #0

## 2018-03-21 MED FILL — AMLODIPINE BESYLATE 5 MG TA: 5 | 30 days supply | Qty: 30 | Fill #0

## 2018-03-21 MED FILL — AMITRIPTYLINE HCL 25 MG TAB: 25 | 30 days supply | Qty: 30 | Fill #0
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
## 2018-03-25 ENCOUNTER — Encounter: Payer: Medicaid Other | Admitting: Neurology

## 2018-03-28 ENCOUNTER — Ambulatory Visit: Payer: Medicaid Other | Admitting: Physical Therapy

## 2018-03-28 ENCOUNTER — Encounter: Payer: Self-pay | Admitting: Neurology

## 2018-03-28 ENCOUNTER — Telehealth: Payer: Self-pay | Admitting: Family Medicine

## 2018-03-28 ENCOUNTER — Encounter: Payer: Self-pay | Admitting: Physical Therapy

## 2018-03-28 ENCOUNTER — Other Ambulatory Visit: Payer: Self-pay | Admitting: Family Medicine

## 2018-03-28 DIAGNOSIS — Z7982 Long term (current) use of aspirin: Secondary | ICD-10-CM

## 2018-03-28 MED ORDER — OMEPRAZOLE 20 MG PO CPDR: 20.0000 mg | DELAYED_RELEASE_CAPSULE | Freq: Every day | ORAL | 11 refills | Status: DC

## 2018-03-28 MED FILL — OMEPRAZOLE 20 MG CAP: 20 | 30 days supply | Qty: 30 | Fill #0

## 2018-03-28 NOTE — Therapy (Signed)
[facility] [address] [place], [place], [address] Phone: [phone]   Fax:  [phone]  Patient Details  Name: Tony Ortega MRN: [id number] Date of Birth: November 22, 1960 Referring Provider:  [name], MD  Encounter Date: 03/28/2018 Pt arrived to PT but complained of struggling with low blood sugar this morning. Would like to go straight to MD to get fluctuating blood sugars addressed and hold PT today. Pt stated that he is safe to drive himself to MD this morning.  Bjorn Loser, PTA  03/28/18, 9:06 AM [place] [address] [place], [place], [address] Phone: [phone]   Fax:  [phone]

## 2018-03-28 NOTE — Telephone Encounter (Signed)
Patient is requesting acid reflux medication.

## 2018-03-28 NOTE — Telephone Encounter (Signed)
New Message   Pt came in, states he was suppose to have some acid reflux medication sent to the [facility] pharmacy but says when he went to pick it up they did not have it. Please f/u

## 2018-03-28 NOTE — Progress Notes (Unsigned)
Patient ID: Tony Ortega, male   DOB: May 16, 1960, 58 y.o.   MRN: [id number]   Per chart, patient is on ASA 325 mg once per day. Will send in PPI for stomach protection.

## 2018-03-28 NOTE — Telephone Encounter (Signed)
Please notify patient that RX has been sent to the [facility] for acid reflux medication

## 2018-04-01 ENCOUNTER — Ambulatory Visit: Payer: Medicaid Other | Admitting: Rehabilitation

## 2018-04-01 ENCOUNTER — Ambulatory Visit: Payer: Medicaid Other | Admitting: Physical Therapy

## 2018-04-04 ENCOUNTER — Ambulatory Visit: Payer: Medicaid Other | Admitting: Physical Therapy

## 2018-04-04 ENCOUNTER — Encounter: Payer: Self-pay | Admitting: Physical Therapy

## 2018-04-04 DIAGNOSIS — R2689 Other abnormalities of gait and mobility: Secondary | ICD-10-CM

## 2018-04-04 DIAGNOSIS — R296 Repeated falls: Secondary | ICD-10-CM

## 2018-04-04 DIAGNOSIS — R208 Other disturbances of skin sensation: Secondary | ICD-10-CM

## 2018-04-04 DIAGNOSIS — R2681 Unsteadiness on feet: Secondary | ICD-10-CM

## 2018-04-04 NOTE — Patient Instructions (Addendum)
Walking Program:     TIMER!!!  Begin walking for exercise for 3 minutes, 2-3 times/day, most days/week.   Progress your walking program by adding 1-2 minutes to your routine each week, as tolerated. Be sure to wear good walking shoes, walk in a safe environment and only progress to your tolerance.

## 2018-04-04 NOTE — Therapy (Signed)
[facility] [address] [place], [place], [address] Phone: [phone]   Fax:  [phone]  Physical Therapy Treatment  Patient Details  Name: Tony Ortega MRN: [id number] Date of Birth: Jul 12, 1960 Referring Provider (PT): [name], MD   Encounter Date: 04/04/2018  PT End of Session - 04/04/18 9371    Visit Number  7    Number of Visits  19   per updated POC   Date for PT Re-Evaluation  04/28/18   per updated POC   Authorization Type  Medicaid approved 3 visits from 1/6-2/5, then will plan to request for 2x/wk for 4 more weeks.     Authorization - Visit Number  3    Authorization - Number of Visits  3    PT Start Time  [phone]    PT Stop Time  0927    PT Time Calculation (min)  40 min    Equipment Utilized During Treatment  Gait belt    Activity Tolerance  Patient tolerated treatment well    Behavior During Therapy  WFL for tasks assessed/performed       Past Medical History:  Diagnosis Date  . Diabetes mellitus without complication ([place])   . Hypertension   . Neuropathy   . Rheumatoid arthritis [facility])     Past Surgical History:  Procedure Laterality Date  . KNEE SURGERY Right     There were no vitals filed for this visit.  Subjective Assessment - 04/04/18 0849    Subjective  Pt reports that L LE feel trembling and weak sometimes.He thinks that when his LE is trembling his blood sugar is to high.    Limitations  Walking    Patient Stated Goals  "For me and you to be able to walk without the cane."     Currently in Pain?  Yes    Pain Score  9     Pain Location  Generalized    Pain Orientation  Other (Comment)   all his joints   Pain Descriptors / Indicators  Throbbing    Pain Type  Chronic pain    Pain Onset  More than a month ago    Pain Frequency  Constant                       [facility] Adult PT Treatment/Exercise - 04/04/18 0001      Ambulation/Gait   Ambulation/Gait  Yes     Ambulation/Gait Assistance  6: Modified independent (Device/Increase time);5: Supervision    Ambulation/Gait Assistance Details  4 min working on Arboriculturist (Feet)  400 Feet    Assistive device  Straight cane    Gait Pattern  Step-through pattern;Decreased arm swing - left;Decreased stride length;Decreased stance time - right;Decreased step length - left;Right genu recurvatum;Decreased weight shift to right;Lateral hip instability;Trunk flexed;Wide base of support    Ambulation Surface  Indoor;Level      Knee/Hip Exercises: Aerobic   Other Aerobic  Sci fit x 10 mins at level 6 resistance decrease to level 3 due to LLE shaking (possibly from fatigue?)      Knee/Hip Exercises: Seated   Long Arc Quad  Strengthening;Both;1 set;10 reps    Marching  Strengthening;Both;1 set;5 reps          Balance Exercises - 04/04/18 [phone]      Balance Exercises: Standing   Stepping Strategy  Lateral;Foam/compliant surface;UE support    Other Standing Exercises  squats  on compliant surface x10 with intermittent UE suppor        PT Education - 04/04/18 0902    Education Details  Importance of cardiovascular exercise for circulation and management of DM    Person(s) Educated  Patient    Methods  Explanation;Handout    Comprehension  Verbalized understanding;Returned demonstration;Need further instruction       PT Short Term Goals - 02/18/18 0915      PT SHORT TERM GOAL #1   Title  Pt will initiate HEP in order to indicate decreased fall risk and improved functional mobility. (Target Date: Following 3rd visit)    Baseline  relays now independent and shows good understanding of this today    Status  Achieved      PT SHORT TERM GOAL #2   Title  Pt will demonstrate safe technique using SPC vs RW in order to indicate decreased fall risk.     Baseline  using SPC mod I to supervision 115 ft X 2    Status  Achieved      PT SHORT TERM GOAL #3   Title  Will perform BERG  in order to formally assess balance/fall risk.  LTG to follow    Baseline  score of 31    Status  Achieved        PT Long Term Goals - 03/14/18 1156      PT LONG TERM GOAL #1   Title  Pt will be independent with on-going HEP in order to indicate decreased fall risk and improved functional mobility.  (Target Date: Following 8th visit)    Baseline  met per verbal report on 03/14/18    Time  4    Period  Weeks    Status  On-going      PT LONG TERM GOAL #2   Title  Pt will improve BERG balance test score to 42/56 in order to indicate decreased fall risk.     Baseline  31/56 was baseline improved to 38/56 on 03/14/18    Time  4    Period  Weeks    Status  Revised      PT LONG TERM GOAL #3   Title  Pt will improve gait speed to >/=2.40 ft/sec w/ LRAD in order to indicate decreased fall risk.     Baseline  1.95 Ft/sec with SPC to 1.53 ft/sec with SPC on 03/14/18    Status  On-going      PT LONG TERM GOAL #4   Title  Pt will perform 5TSS in </=25 secs without UE support in order to indicate decreased fall risk and improved functional strength.     Baseline  31.85 secs without UE support  to 37.31 secs without UE support on 03/14/18    Status  On-going      PT LONG TERM GOAL #5   Title  Will assess AFO needs and request order as appropriate to decrease fall risk.     Baseline  unable to assess due to time contraint     Status  On-going            Plan - 04/04/18 5537    Clinical Impression Statement  Skilled session focused on LE strengthening, gait activity tolerance, and standing balance.  LEs fatigue quickLy during activity with LLE trembling at times requiring pt have a seated rest  break.  Updated HEP to include walking program for pt to gradually increase endurance; pt ambulated with SPC 4 min before  needing a seated rest.  Working on balance standing on compliant surface; pt needing at least 1 UE support.                                                               Rehab  Potential  Good    PT Frequency  2x / week    PT Duration  4 weeks    PT Treatment/Interventions  ADLs/Self Care Home Management;Aquatic Therapy;Electrical Stimulation;DME Instruction;Gait training;Stair training;Functional mobility training;Therapeutic activities;Therapeutic exercise;Balance training;Neuromuscular re-education;Patient/family education;Orthotic Fit/Training;Passive range of motion;Dry needling;Vestibular    PT Next Visit Plan  Assess need for R AFO, continue with gait, balance, compliant sufaces     Consulted and Agree with Plan of Care  Patient       Patient will benefit from skilled therapeutic intervention in order to improve the following deficits and impairments:  Abnormal gait, Decreased activity tolerance, Decreased balance, Decreased coordination, Decreased endurance, Decreased knowledge of precautions, Decreased knowledge of use of DME, Decreased mobility, Decreased safety awareness, Decreased strength, Impaired perceived functional ability, Impaired flexibility, Impaired sensation, Impaired UE functional use, Postural dysfunction  Visit Diagnosis: Other disturbances of skin sensation  Unsteadiness on feet  Other abnormalities of gait and mobility  Repeated falls     Problem List Patient Active Problem List   Diagnosis Date Noted  . Paresthesia 02/24/2018  . Gait abnormality 02/24/2018  . Weakness 02/24/2018  . Tobacco use disorder 02/15/2018  . Community acquired pneumonia 02/15/2018  . Essential hypertension 02/15/2018  . Poorly controlled type 2 diabetes mellitus with peripheral neuropathy ([place]) 02/15/2018  . Rheumatoid arthritis ([place]) 02/15/2018    Bjorn Loser, PTA  04/04/18, 1:40 PM [facility] [address] [place], [place], [address] Phone: [phone]   Fax:  [phone]  Name: Tony Ortega MRN: [id number] Date of Birth: 27-Feb-1960

## 2018-04-06 ENCOUNTER — Encounter: Payer: Self-pay | Admitting: Physical Therapy

## 2018-04-06 ENCOUNTER — Ambulatory Visit: Payer: Medicaid Other | Admitting: Physical Therapy

## 2018-04-06 DIAGNOSIS — R208 Other disturbances of skin sensation: Secondary | ICD-10-CM | POA: Diagnosis not present

## 2018-04-06 DIAGNOSIS — R2681 Unsteadiness on feet: Secondary | ICD-10-CM

## 2018-04-06 DIAGNOSIS — R296 Repeated falls: Secondary | ICD-10-CM

## 2018-04-06 DIAGNOSIS — R2689 Other abnormalities of gait and mobility: Secondary | ICD-10-CM

## 2018-04-06 NOTE — Therapy (Signed)
[facility] [address] [place], [place], [address] Phone: [phone]   Fax:  [phone]  Physical Therapy Treatment  Patient Details  Name: Tony Ortega MRN: [id number] Date of Birth: 10-13-1960 Referring Provider (PT): [name], MD   Encounter Date: 04/06/2018  PT End of Session - 04/06/18 0931    Visit Number  8    Number of Visits  19   per updated POC   Date for PT Re-Evaluation  04/28/18   per updated POC   Authorization Type  Medicaid approved 3 visits from 1/6-2/5, then will plan to request for 2x/wk for 4 more weeks.     Authorization - Visit Number  3    Authorization - Number of Visits  3    PT Start Time  [phone]    PT Stop Time  0930    PT Time Calculation (min)  41 min    Equipment Utilized During Treatment  Gait belt    Activity Tolerance  Patient tolerated treatment well    Behavior During Therapy  WFL for tasks assessed/performed       Past Medical History:  Diagnosis Date  . Diabetes mellitus without complication ([place])   . Hypertension   . Neuropathy   . Rheumatoid arthritis [facility])     Past Surgical History:  Procedure Laterality Date  . KNEE SURGERY Right     There were no vitals filed for this visit.  Subjective Assessment - 04/06/18 0853    Subjective  Walked around the block a couple time took about 7 min. [employer] and [employer]; had pain in L LE.  Thinks the Sci fit helps with joint pain. Likes to do it at the beginning of the PT session.                                                 Pertinent History  DM II with peripheral neuropathy, RA, HTN     Limitations  Walking    Patient Stated Goals  "For me and you to be able to walk without the cane."     Currently in Pain?  Yes    Pain Score  8     Pain Location  Generalized    Pain Orientation  --   all joints   Pain Descriptors / Indicators  Throbbing    Pain Type  Chronic pain    Pain Onset  More than a month ago    Pain  Frequency  Constant                       [facility] Adult PT Treatment/Exercise - 04/06/18 0001      Ambulation/Gait   Ambulation/Gait  Yes    Ambulation/Gait Assistance  5: Supervision    Ambulation/Gait Assistance Details  Working on dynamic gait with head turns and changes in direction    Ambulation Distance (Feet)  400 Feet    Assistive device  Straight cane    Gait Pattern  Step-through pattern;Decreased arm swing - left;Decreased stride length;Decreased stance time - right;Decreased step length - left;Right genu recurvatum;Decreased weight shift to right;Lateral hip instability;Trunk flexed;Wide base of support    Ambulation Surface  Level;Indoor      Exercises   Exercises  Lumbar      Lumbar Exercises: Quadruped  Other Quadruped Lumbar Exercises  knee bent hip abductions 5x each side.      Knee/Hip Exercises: Aerobic   Other Aerobic  Sci fit x 10 mins at level 3 resistance continuous 10 min.      Knee/Hip Exercises: Supine   Bridges  Strengthening;10 reps;2 sets      Knee/Hip Exercises: Prone   Hip Extension  Strengthening;Both;1 set;10 reps          Balance Exercises - 04/06/18 0930      Balance Exercises: Standing   Standing Eyes Opened  Wide (BOA);Head turns;Foam/compliant surface;Solid surface; intermittent UE support.         PT Short Term Goals - 02/18/18 0915      PT SHORT TERM GOAL #1   Title  Pt will initiate HEP in order to indicate decreased fall risk and improved functional mobility. (Target Date: Following 3rd visit)    Baseline  relays now independent and shows good understanding of this today    Status  Achieved      PT SHORT TERM GOAL #2   Title  Pt will demonstrate safe technique using SPC vs RW in order to indicate decreased fall risk.     Baseline  using SPC mod I to supervision 115 ft X 2    Status  Achieved      PT SHORT TERM GOAL #3   Title  Will perform BERG in order to formally assess balance/fall risk.  LTG to  follow    Baseline  score of 31    Status  Achieved        PT Long Term Goals - 03/14/18 1156      PT LONG TERM GOAL #1   Title  Pt will be independent with on-going HEP in order to indicate decreased fall risk and improved functional mobility.  (Target Date: Following 8th visit)    Baseline  met per verbal report on 03/14/18    Time  4    Period  Weeks    Status  On-going      PT LONG TERM GOAL #2   Title  Pt will improve BERG balance test score to 42/56 in order to indicate decreased fall risk.     Baseline  31/56 was baseline improved to 38/56 on 03/14/18    Time  4    Period  Weeks    Status  Revised      PT LONG TERM GOAL #3   Title  Pt will improve gait speed to >/=2.40 ft/sec w/ LRAD in order to indicate decreased fall risk.     Baseline  1.95 Ft/sec with SPC to 1.53 ft/sec with SPC on 03/14/18    Status  On-going      PT LONG TERM GOAL #4   Title  Pt will perform 5TSS in </=25 secs without UE support in order to indicate decreased fall risk and improved functional strength.     Baseline  31.85 secs without UE support  to 37.31 secs without UE support on 03/14/18    Status  On-going      PT LONG TERM GOAL #5   Title  Will assess AFO needs and request order as appropriate to decrease fall risk.     Baseline  unable to assess due to time contraint     Status  On-going            Plan - 04/06/18 0912    Clinical Impression Statement  Skilled session focused on  LE strengthening, dynamic gait training with SPC, and standing balance. Pt demonstrated with dynamic gait training imbalance with head movements and changes in direction requiring supervision. Pt was challenged but tolerated LE strengthening including exercise in quadruped; pt would benefit from continued hip and core strengthening in quadruped.  Worked on standing balance on compliant and on solid surface; pt continues to required intermittent UE support with head movments.                         Rehab Potential   Good    PT Frequency  2x / week    PT Duration  4 weeks    PT Treatment/Interventions  ADLs/Self Care Home Management;Aquatic Therapy;Electrical Stimulation;DME Instruction;Gait training;Stair training;Functional mobility training;Therapeutic activities;Therapeutic exercise;Balance training;Neuromuscular re-education;Patient/family education;Orthotic Fit/Training;Passive range of motion;Dry needling;Vestibular    PT Next Visit Plan  Assess need for R AFO, Bil LE strengthening in quadruped;  continue with gait, balance, compliant sufaces.    Consulted and Agree with Plan of Care  Patient       Patient will benefit from skilled therapeutic intervention in order to improve the following deficits and impairments:  Abnormal gait, Decreased activity tolerance, Decreased balance, Decreased coordination, Decreased endurance, Decreased knowledge of precautions, Decreased knowledge of use of DME, Decreased mobility, Decreased safety awareness, Decreased strength, Impaired perceived functional ability, Impaired flexibility, Impaired sensation, Impaired UE functional use, Postural dysfunction  Visit Diagnosis: Other disturbances of skin sensation  Unsteadiness on feet  Other abnormalities of gait and mobility  Repeated falls     Problem List Patient Active Problem List   Diagnosis Date Noted  . Paresthesia 02/24/2018  . Gait abnormality 02/24/2018  . Weakness 02/24/2018  . Tobacco use disorder 02/15/2018  . Community acquired pneumonia 02/15/2018  . Essential hypertension 02/15/2018  . Poorly controlled type 2 diabetes mellitus with peripheral neuropathy ([place]) 02/15/2018  . Rheumatoid arthritis ([place]) 02/15/2018    Bjorn Loser, PTA  04/06/18, 10:45 AM [facility] [address] [place], [place], [address] Phone: [phone]   Fax:  [phone]  Name: Tony Ortega MRN: [id number] Date of Birth: [birth year]

## 2018-04-11 ENCOUNTER — Ambulatory Visit: Payer: Medicaid Other | Attending: Family Medicine | Admitting: Rehabilitation

## 2018-04-11 ENCOUNTER — Encounter: Payer: Self-pay | Admitting: Rehabilitation

## 2018-04-11 DIAGNOSIS — R208 Other disturbances of skin sensation: Secondary | ICD-10-CM | POA: Diagnosis not present

## 2018-04-11 DIAGNOSIS — R296 Repeated falls: Secondary | ICD-10-CM | POA: Insufficient documentation

## 2018-04-11 DIAGNOSIS — R2689 Other abnormalities of gait and mobility: Secondary | ICD-10-CM | POA: Insufficient documentation

## 2018-04-11 DIAGNOSIS — R2681 Unsteadiness on feet: Secondary | ICD-10-CM | POA: Insufficient documentation

## 2018-04-11 NOTE — Patient Instructions (Signed)
Access Code: [email or web address]  URL: [email or web address]  Date: 04/11/2018  Prepared by: Cameron Sprang   Exercises  Step Up - 10 reps - 2 sets - 2x daily - 7x weekly  Standing March with Counter Support - 10 reps - 2 sets - 2x daily - 7x weekly  Standing Hip Extension with Counter Support - 10 reps - 2 sets - 2x daily - 7x weekly  Heel rises with counter support - 10 reps - 2 sets - 2x daily - 7x weekly  Toe Raises with Counter Support - 10 reps - 2 sets - 2x daily - 7x weekly  Side Stepping with Counter Support - 10 reps - 2 sets - 2x daily - 7x weekly  Standing Balance with Eyes Closed - 3 reps - 1 sets - 20 hold - 1x daily - 7x weekly  Romberg Stance - 3 reps - 1 sets - 20 hold - 1x daily - 7x weekly  Seated Knee Extension with Resistance - 10 reps - 1 sets - 1x daily - 7x weekly  Supine Knee to Chest with Leg Straight - 10 reps - 1 sets - 1x daily - 7x weekly  Quadruped Alternating Leg Extensions - 10 reps - 1 sets - 1x daily - 7x weekly  Bird Dog - 10 reps - 1 sets - 1x daily - 7x weekly  Quadruped Fire Hydrant - 10 reps - 1 sets - 1x daily - 7x weekly

## 2018-04-11 NOTE — Therapy (Signed)
[facility] [address] [place], [place], [address] Phone: [phone]   Fax:  [phone]  Physical Therapy Treatment  Patient Details  Name: Tony Ortega MRN: [id number] Date of Birth: [birth year] Referring Provider (PT): [name], MD   Encounter Date: 04/11/2018  PT End of Session - 04/11/18 1304    Visit Number  9    Number of Visits  19   per updated POC   Date for PT Re-Evaluation  04/28/18   per updated POC   Authorization Type  Medicaid approved 3 visits from 1/6-2/5, then 8 visits from 2/17-3/15    Authorization - Visit Number  4    Authorization - Number of Visits  11    PT Start Time  0932    PT Stop Time  1015    PT Time Calculation (min)  43 min    Equipment Utilized During Treatment  Gait belt    Activity Tolerance  Patient tolerated treatment well    Behavior During Therapy  [facility] for tasks assessed/performed       Past Medical History:  Diagnosis Date  . Diabetes mellitus without complication ([place])   . Hypertension   . Neuropathy   . Rheumatoid arthritis [facility])     Past Surgical History:  Procedure Laterality Date  . KNEE SURGERY Right     There were no vitals filed for this visit.  Subjective Assessment - 04/11/18 0937    Subjective  Walked 3 times around block.  Each times takes about 7-10 mins.     Pertinent History  DM II with peripheral neuropathy, RA, HTN     Limitations  Walking    Patient Stated Goals  "For me and you to be able to walk without the cane."     Currently in Pain?  Yes    Pain Score  8     Pain Location  Generalized    Pain Orientation  --   all joints   Pain Descriptors / Indicators  Throbbing;Pins and needles    Pain Type  Chronic pain    Pain Onset  More than a month ago    Pain Frequency  Constant    Aggravating Factors   not taking medication    Pain Relieving Factors  sleeping                        [facility] Adult PT Treatment/Exercise -  04/11/18 0001      Self-Care   Self-Care  Other Self-Care Comments    Other Self-Care Comments   Discussion about asking for date extension to get visits in.  Also discussed pt speaking with MD before discontinuing any medication as pt has heard that celery juice can reduce blood pressure and blood sugar.  Pt verbalized understanding.       Neuro Re-ed    Neuro Re-ed Details   Ended session with balance/strengthening with alternating cone taps in corner without support and min/guard to min A from PT x 10 reps progressing to crossing over then in front x 10 reps.  Pt needs cues for posture and adequate weight shift with proximal hip and knee extension      Exercises   Exercises  Knee/Hip    Other Exercises   Quadruped alternating LEs x 10 reps each, quadruped alternating UE/LEs x 10 reps, quadruped fire hydrant (with hip flex for oblique activation) x 10 reps on each side.  Pt needs continued  cues for maintaining UE extension throughout.  Added these to HEP.  Also performed half kneel>standing and vice versa transitions with single UE support x 5 reps on each side.        Lumbar Exercises: Aerobic   Nustep  Level 4 resistance with BUEs/LEs x 8 mins, keeping rpms at 80's-90's.  Tolerated well.            Access Code: [email or web address]  URL: [email or web address]  Date: 04/11/2018  Prepared by: Cameron Sprang   Exercises  Step Up - 10 reps - 2 sets - 2x daily - 7x weekly  Standing March with Counter Support - 10 reps - 2 sets - 2x daily - 7x weekly  Standing Hip Extension with Counter Support - 10 reps - 2 sets - 2x daily - 7x weekly  Heel rises with counter support - 10 reps - 2 sets - 2x daily - 7x weekly  Toe Raises with Counter Support - 10 reps - 2 sets - 2x daily - 7x weekly  Side Stepping with Counter Support - 10 reps - 2 sets - 2x daily - 7x weekly  Standing Balance with Eyes Closed - 3 reps - 1 sets - 20 hold - 1x daily - 7x weekly  Romberg Stance - 3 reps - 1 sets - 20  hold - 1x daily - 7x weekly  Seated Knee Extension with Resistance - 10 reps - 1 sets - 1x daily - 7x weekly  Supine Knee to Chest with Leg Straight - 10 reps - 1 sets - 1x daily - 7x weekly  Quadruped Alternating Leg Extensions - 10 reps - 1 sets - 1x daily - 7x weekly  Bird Dog - 10 reps - 1 sets - 1x daily - 7x weekly  Quadruped Fire Hydrant - 10 reps - 1 sets - 1x daily - 7x weekly   Only performed last 3 exercises during session.     PT Education - 04/11/18 0940    Education Details  importance of getting guidance for medication as he wanted to take celery to reduce blood sugar and blood pressure.     Person(s) Educated  Patient    Methods  Explanation    Comprehension  Verbalized understanding       PT Short Term Goals - 02/18/18 0915      PT SHORT TERM GOAL #1   Title  Pt will initiate HEP in order to indicate decreased fall risk and improved functional mobility. (Target Date: Following 3rd visit)    Baseline  relays now independent and shows good understanding of this today    Status  Achieved      PT SHORT TERM GOAL #2   Title  Pt will demonstrate safe technique using SPC vs RW in order to indicate decreased fall risk.     Baseline  using SPC mod I to supervision 115 ft X 2    Status  Achieved      PT SHORT TERM GOAL #3   Title  Will perform BERG in order to formally assess balance/fall risk.  LTG to follow    Baseline  score of 31    Status  Achieved        PT Long Term Goals - 03/14/18 1156      PT LONG TERM GOAL #1   Title  Pt will be independent with on-going HEP in order to indicate decreased fall risk and improved functional mobility.  (Target Date: Following 8th visit)  Baseline  met per verbal report on 03/14/18    Time  4    Period  Weeks    Status  On-going      PT LONG TERM GOAL #2   Title  Pt will improve BERG balance test score to 42/56 in order to indicate decreased fall risk.     Baseline  31/56 was baseline improved to 38/56 on 03/14/18     Time  4    Period  Weeks    Status  Revised      PT LONG TERM GOAL #3   Title  Pt will improve gait speed to >/=2.40 ft/sec w/ LRAD in order to indicate decreased fall risk.     Baseline  1.95 Ft/sec with SPC to 1.53 ft/sec with SPC on 03/14/18    Status  On-going      PT LONG TERM GOAL #4   Title  Pt will perform 5TSS in </=25 secs without UE support in order to indicate decreased fall risk and improved functional strength.     Baseline  31.85 secs without UE support  to 37.31 secs without UE support on 03/14/18    Status  On-going      PT LONG TERM GOAL #5   Title  Will assess AFO needs and request order as appropriate to decrease fall risk.     Baseline  unable to assess due to time contraint     Status  On-going            Plan - 04/11/18 1307    Clinical Impression Statement  Skilled session focused on LE strengthening with quadruped and half kneeling transitions, nustep and also balance exercises.  Pt making good progress towards goal.  Pt has missed a few visits in this episode of care, therefore would plan to ask for date extension to capture remaining visits.     Rehab Potential  Good    PT Frequency  2x / week    PT Duration  4 weeks    PT Treatment/Interventions  ADLs/Self Care Home Management;Aquatic Therapy;Electrical Stimulation;DME Instruction;Gait training;Stair training;Functional mobility training;Therapeutic activities;Therapeutic exercise;Balance training;Neuromuscular re-education;Patient/family education;Orthotic Fit/Training;Passive range of motion;Dry needling;Vestibular    PT Next Visit Plan  Bil LE strengthening in quadruped;  continue with gait, balance, compliant sufaces.    Consulted and Agree with Plan of Care  Patient       Patient will benefit from skilled therapeutic intervention in order to improve the following deficits and impairments:  Abnormal gait, Decreased activity tolerance, Decreased balance, Decreased coordination, Decreased endurance,  Decreased knowledge of precautions, Decreased knowledge of use of DME, Decreased mobility, Decreased safety awareness, Decreased strength, Impaired perceived functional ability, Impaired flexibility, Impaired sensation, Impaired UE functional use, Postural dysfunction  Visit Diagnosis: Other disturbances of skin sensation  Unsteadiness on feet  Other abnormalities of gait and mobility  Repeated falls     Problem List Patient Active Problem List   Diagnosis Date Noted  . Paresthesia 02/24/2018  . Gait abnormality 02/24/2018  . Weakness 02/24/2018  . Tobacco use disorder 02/15/2018  . Community acquired pneumonia 02/15/2018  . Essential hypertension 02/15/2018  . Poorly controlled type 2 diabetes mellitus with peripheral neuropathy ([place]) 02/15/2018  . Rheumatoid arthritis ([place]) 02/15/2018    Cameron Sprang, PT, MPT [facility] [address] [place], [place], [address] Phone: [phone]   Fax:  [phone] 04/11/18, 1:10 PM  Name: Hurley Sobel MRN: [id number] Date of Birth: 12/04/1960

## 2018-04-14 ENCOUNTER — Encounter: Payer: Self-pay | Admitting: Rehabilitation

## 2018-04-14 ENCOUNTER — Ambulatory Visit: Payer: Medicaid Other | Admitting: Rehabilitation

## 2018-04-14 DIAGNOSIS — R2689 Other abnormalities of gait and mobility: Secondary | ICD-10-CM

## 2018-04-14 DIAGNOSIS — R208 Other disturbances of skin sensation: Secondary | ICD-10-CM | POA: Diagnosis not present

## 2018-04-14 DIAGNOSIS — R296 Repeated falls: Secondary | ICD-10-CM

## 2018-04-14 DIAGNOSIS — R2681 Unsteadiness on feet: Secondary | ICD-10-CM

## 2018-04-14 NOTE — Therapy (Signed)
[facility] [address] [place], [place], [address] Phone: [phone]   Fax:  [phone]  Physical Therapy Treatment  Patient Details  Name: Tony Ortega MRN: [id number] Date of Birth: [birth year] Referring Provider (PT): [name], MD   Encounter Date: 04/14/2018  PT End of Session - 04/14/18 1014    Visit Number  10    Number of Visits  19   per updated POC   Date for PT Re-Evaluation  04/28/18   per updated POC   Authorization Type  Medicaid approved 3 visits from 1/6-2/5, then 8 visits from 2/17-3/15    Authorization - Visit Number  5    Authorization - Number of Visits  11    PT Start Time  0931    PT Stop Time  1015    PT Time Calculation (min)  44 min    Equipment Utilized During Treatment  Gait belt    Activity Tolerance  Patient tolerated treatment well    Behavior During Therapy  [facility] for tasks assessed/performed       Past Medical History:  Diagnosis Date  . Diabetes mellitus without complication ([place])   . Hypertension   . Neuropathy   . Rheumatoid arthritis [facility])     Past Surgical History:  Procedure Laterality Date  . KNEE SURGERY Right     There were no vitals filed for this visit.  Subjective Assessment - 04/14/18 0933    Subjective  Pt reports being sore from doing nustep last session.     Pertinent History  DM II with peripheral neuropathy, RA, HTN     Limitations  Walking    Patient Stated Goals  "For me and you to be able to walk without the cane."     Currently in Pain?  Yes    Pain Score  2     Pain Location  Leg    Pain Orientation  Right;Left    Pain Descriptors / Indicators  Sore    Pain Type  Acute pain    Pain Onset  In the past 7 days    Pain Frequency  Constant    Aggravating Factors   working out    Pain Relieving Factors  stretching                       [facility] Adult PT Treatment/Exercise - 04/14/18 0943      Transfers   Comments  Worked on  sit<>stand for LE strengthening (esp RLE) with RLE placed posterior to LLE x 10 reps with emphasis on controlled sitting.  Pt tends to lock out LLE when sitting, therefore for last few reps, had pt place feet side by side for more controlled descent.        Neuro Re-ed    Neuro Re-ed Details   In // bars for high level balance, strengthening and improved  hip strategy:  stepping up onto small rocker board and then elevating opposite LE into march position with BUE support x 5 reps>single UE support x 5 reps on each side. Pt very slow to perform task and needs max cues for improved hip extension during task.  Progressed to standing on foam beam x 2 sets of 20 secs.  Pt much better at eliciting hip strategy during this session than previous ones.  Also note pt needs less UE support.        Exercises   Exercises  Knee/Hip    Other  Exercises   Attempted to perform jumping task during session for improved LE strength, esp plantar flexion to carryover to improved push off during gait.  Pt needs heavy reliance of UEs and was unable to do, therefore had pt work on heel raises with single UE support x 10 reps progressing to heel raises with squatting in between reps to build up to jumping motion.  Pt then able to slightly elevate from floor but still needs UEs quite a  bit.        Lumbar Exercises: Stretches   Active Hamstring Stretch  Right;Left;1 rep;30 seconds    Active Hamstring Stretch Limitations  Seated on EOM    Single Knee to Chest Stretch  Right;Left;1 rep;30 seconds    Single Knee to Chest Stretch Limitations  Seated     Piriformis Stretch  Right;Left;1 rep;30 seconds    Piriformis Stretch Limitations  Seated with LE crossed over opposite LE      Lumbar Exercises: Aerobic   Other Aerobic Exercise  Stepper x 6 mins at level 3 resistance with BUEs/LEs for overall strengthening and endurance.              PT Education - 04/14/18 1033    Education Details  purpose of balance exercises.      Person(s) Educated  Patient    Methods  Explanation    Comprehension  Verbalized understanding       PT Short Term Goals - 02/18/18 0915      PT SHORT TERM GOAL #1   Title  Pt will initiate HEP in order to indicate decreased fall risk and improved functional mobility. (Target Date: Following 3rd visit)    Baseline  relays now independent and shows good understanding of this today    Status  Achieved      PT SHORT TERM GOAL #2   Title  Pt will demonstrate safe technique using SPC vs RW in order to indicate decreased fall risk.     Baseline  using SPC mod I to supervision 115 ft X 2    Status  Achieved      PT SHORT TERM GOAL #3   Title  Will perform BERG in order to formally assess balance/fall risk.  LTG to follow    Baseline  score of 31    Status  Achieved        PT Long Term Goals - 04/14/18 1037      PT LONG TERM GOAL #1   Title  Pt will be independent with on-going HEP in order to indicate decreased fall risk and improved functional mobility.  (Target Date: Following 8th visit)    Baseline  met per verbal report on 03/14/18    Time  4    Period  Weeks    Status  On-going      PT LONG TERM GOAL #2   Title  Pt will improve BERG balance test score to 42/56 in order to indicate decreased fall risk.     Baseline  31/56 was baseline improved to 38/56 on 03/14/18    Time  4    Period  Weeks    Status  Revised      PT LONG TERM GOAL #3   Title  Pt will improve gait speed to >/=2.40 ft/sec w/ LRAD in order to indicate decreased fall risk.     Baseline  1.95 Ft/sec with SPC to 1.53 ft/sec with SPC on 03/14/18    Status  On-going  PT LONG TERM GOAL #4   Title  Pt will perform 5TSS in </=25 secs without UE support in order to indicate decreased fall risk and improved functional strength.     Baseline  31.85 secs without UE support  to 37.31 secs without UE support on 03/14/18    Status  On-going      PT LONG TERM GOAL #5   Title  Will assess AFO needs and request order as  appropriate to decrease fall risk.     Baseline  deferred as he has had no instances of foot catching in 2 weeks    Status  Deferred            Plan - 04/14/18 1034    Clinical Impression Statement  Skilled session focused on LE strengthening, endurance, and high level balance with emphasis on variable surfaces and hip strategy.  Pt making gains towards goals.  Will begin to check goals next week in order to request more time for visits not used during this episode of care.     Rehab Potential  Good    PT Frequency  2x / week    PT Duration  4 weeks    PT Treatment/Interventions  ADLs/Self Care Home Management;Aquatic Therapy;Electrical Stimulation;DME Instruction;Gait training;Stair training;Functional mobility training;Therapeutic activities;Therapeutic exercise;Balance training;Neuromuscular re-education;Patient/family education;Orthotic Fit/Training;Passive range of motion;Dry needling;Vestibular    PT Next Visit Plan  Begin to check LTGs and have him schedule 2x/wk for 2 more weeks. Letta Moynahan you can send to me to update POC/request more visits.  Bil LE strengthening in quadruped;  continue with gait, balance, compliant sufaces.    Consulted and Agree with Plan of Care  Patient       Patient will benefit from skilled therapeutic intervention in order to improve the following deficits and impairments:  Abnormal gait, Decreased activity tolerance, Decreased balance, Decreased coordination, Decreased endurance, Decreased knowledge of precautions, Decreased knowledge of use of DME, Decreased mobility, Decreased safety awareness, Decreased strength, Impaired perceived functional ability, Impaired flexibility, Impaired sensation, Impaired UE functional use, Postural dysfunction  Visit Diagnosis: Other disturbances of skin sensation  Unsteadiness on feet  Other abnormalities of gait and mobility  Repeated falls     Problem List Patient Active Problem List   Diagnosis Date Noted  .  Paresthesia 02/24/2018  . Gait abnormality 02/24/2018  . Weakness 02/24/2018  . Tobacco use disorder 02/15/2018  . Community acquired pneumonia 02/15/2018  . Essential hypertension 02/15/2018  . Poorly controlled type 2 diabetes mellitus with peripheral neuropathy ([place]) 02/15/2018  . Rheumatoid arthritis ([place]) 02/15/2018    Cameron Sprang, PT, MPT [facility] [address] [place], [place], [address] Phone: [phone]   Fax:  [phone] 04/14/18, 10:38 AM  Name: Tony Ortega MRN: [id number] Date of Birth: [birth year]

## 2018-04-15 ENCOUNTER — Encounter: Payer: Self-pay | Admitting: Pharmacist

## 2018-04-15 ENCOUNTER — Ambulatory Visit: Payer: Medicaid Other | Attending: Family Medicine | Admitting: Pharmacist

## 2018-04-15 DIAGNOSIS — Z79899 Other long term (current) drug therapy: Secondary | ICD-10-CM

## 2018-04-15 NOTE — Progress Notes (Signed)
Patient was educated on the use of the True Test blood glucose meter. Reviewed necessary supplies and operation of the meter. Also reviewed goal blood glucose levels. Patient was able to demonstrate use. All questions and concerns were addressed.

## 2018-04-18 ENCOUNTER — Ambulatory Visit: Payer: Medicaid Other | Admitting: Physical Therapy

## 2018-04-18 ENCOUNTER — Encounter: Payer: Self-pay | Admitting: Physical Therapy

## 2018-04-18 DIAGNOSIS — R2681 Unsteadiness on feet: Secondary | ICD-10-CM

## 2018-04-18 DIAGNOSIS — R208 Other disturbances of skin sensation: Secondary | ICD-10-CM | POA: Diagnosis not present

## 2018-04-18 DIAGNOSIS — R296 Repeated falls: Secondary | ICD-10-CM

## 2018-04-18 DIAGNOSIS — R2689 Other abnormalities of gait and mobility: Secondary | ICD-10-CM

## 2018-04-18 NOTE — Therapy (Signed)
[facility] [address] [place], [place], [address] Phone: [phone]   Fax:  [phone]  Physical Therapy Treatment  Patient Details  Name: Tony Ortega MRN: [id number] Date of Birth: [birth year] Referring Provider (PT): [name], MD   Encounter Date: 04/18/2018  PT End of Session - 04/18/18 0932    Visit Number  11    Number of Visits  19   per updated POC   Date for PT Re-Evaluation  04/28/18   per updated POC   Authorization Type  Medicaid approved 3 visits from 1/6-2/5, then 8 visits from 2/17-3/15    Authorization - Visit Number  5    Authorization - Number of Visits  11    PT Start Time  6720    PT Stop Time  0929    PT Time Calculation (min)  39 min    Equipment Utilized During Treatment  Gait belt    Activity Tolerance  Patient tolerated treatment well    Behavior During Therapy  [facility] for tasks assessed/performed       Past Medical History:  Diagnosis Date  . Diabetes mellitus without complication ([place])   . Hypertension   . Neuropathy   . Rheumatoid arthritis [facility])     Past Surgical History:  Procedure Laterality Date  . KNEE SURGERY Right     There were no vitals filed for this visit.  Subjective Assessment - 04/18/18 0850    Subjective  "I've been walking a lot; walking the dog." Pt reports walking outside pushing the w/c just in case his LE gives out. Pt has stationary bike that he uses about 3x/week.  Pt does not feel good today due to the time change and didn't have breakfast.    Pertinent History  DM II with peripheral neuropathy, RA, HTN     Limitations  Walking    Patient Stated Goals  "For me and you to be able to walk without the cane."     Currently in Pain?  Yes    Pain Score  8     Pain Location  Generalized   "my joints"   Pain Orientation  Other (Comment)   in joints   Pain Descriptors / Indicators  Throbbing;Aching    Pain Onset  In the past 7 days    Pain Frequency   Constant                       [facility] Adult PT Treatment/Exercise - 04/18/18 0001      Ambulation/Gait   Ambulation/Gait  Yes    Ambulation/Gait Assistance  5: Supervision    Ambulation/Gait Assistance Details  trainig  for activity tolerance and balance with dynamic gait.    Ambulation Distance (Feet)  400 Feet    Assistive device  Straight cane    Gait Pattern  Step-through pattern;Decreased arm swing - left;Decreased stride length;Decreased stance time - right;Decreased step length - left;Right genu recurvatum;Decreased weight shift to right;Lateral hip instability;Trunk flexed;Wide base of support    Ambulation Surface  Level;Indoor      Knee/Hip Exercises: Aerobic   Other Aerobic  Sci fit x 8 mins at level 3 resistance, all extremities.      Knee/Hip Exercises: Standing   Hip Extension  Stengthening;Both;1 set;10 reps          Balance Exercises - 04/18/18 0917      Balance Exercises: Standing   Gait with Head Turns  Forward  with [facility]   Retro Gait  Upper extremity support   with [facility]   Sidestepping  Upper extremity support;Other (comment)   with SPC   Marching Limitations  in-place, x10 with 1 UE suppport, cues for posture.    Sit to Stand Time  x10 without UE support, 43.04 seconds        PT Education - 04/18/18 1255    Education Details  Discussed thee purpose of HEP for LE strengthening and balance, scheduling, POC.    Person(s) Educated  Patient    Methods  Explanation;Demonstration;Handout;Verbal cues    Comprehension  Verbalized understanding;Returned demonstration;Verbal cues required;Need further instruction       PT Short Term Goals - 02/18/18 0915      PT SHORT TERM GOAL #1   Title  Pt will initiate HEP in order to indicate decreased fall risk and improved functional mobility. (Target Date: Following 3rd visit)    Baseline  relays now independent and shows good understanding of this today    Status  Achieved      PT SHORT TERM GOAL  #2   Title  Pt will demonstrate safe technique using SPC vs RW in order to indicate decreased fall risk.     Baseline  using SPC mod I to supervision 115 ft X 2    Status  Achieved      PT SHORT TERM GOAL #3   Title  Will perform BERG in order to formally assess balance/fall risk.  LTG to follow    Baseline  score of 31    Status  Achieved        PT Long Term Goals - 04/14/18 1037      PT LONG TERM GOAL #1   Title  Pt will be independent with on-going HEP in order to indicate decreased fall risk and improved functional mobility.  (Target Date: Following 8th visit)    Baseline  met per verbal report on 03/14/18    Time  4    Period  Weeks    Status  On-going      PT LONG TERM GOAL #2   Title  Pt will improve BERG balance test score to 42/56 in order to indicate decreased fall risk.     Baseline  31/56 was baseline improved to 38/56 on 03/14/18    Time  4    Period  Weeks    Status  Revised      PT LONG TERM GOAL #3   Title  Pt will improve gait speed to >/=2.40 ft/sec w/ LRAD in order to indicate decreased fall risk.     Baseline  1.95 Ft/sec with SPC to 1.53 ft/sec with SPC on 03/14/18    Status  On-going      PT LONG TERM GOAL #4   Title  Pt will perform 5TSS in </=25 secs without UE support in order to indicate decreased fall risk and improved functional strength.     Baseline  31.85 secs without UE support  to 37.31 secs without UE support on 03/14/18    Status  On-going      PT LONG TERM GOAL #5   Title  Will assess AFO needs and request order as appropriate to decrease fall risk.     Baseline  deferred as he has had no instances of foot catching in 2 weeks    Status  Deferred            Plan - 04/18/18 1256  Clinical Impression Statement  Started to check LTGs but pt reported that he did not have breakfast this morning, was not feeling good and had not checked his glucose this morning.  Continued treatment with dynamic gait training, LE strengthening, and  reviewing  HEP including discussing the purpose of HEP.  Pt continues to require supervision with dynamic gait; pt self correcting imbalances.  Pt does not seem to consistently perform HEP preferring to walk for exercise.                                                  Rehab Potential  Good    PT Frequency  2x / week    PT Duration  4 weeks    PT Treatment/Interventions  ADLs/Self Care Home Management;Aquatic Therapy;Electrical Stimulation;DME Instruction;Gait training;Stair training;Functional mobility training;Therapeutic activities;Therapeutic exercise;Balance training;Neuromuscular re-education;Patient/family education;Orthotic Fit/Training;Passive range of motion;Dry needling;Vestibular    PT Next Visit Plan   check LTGs. Letta Moynahan you can send to me to update POC/request more visits.  Bil LE strengthening in quadruped;  continue with gait, balance, compliant sufaces.    Consulted and Agree with Plan of Care  Patient       Patient will benefit from skilled therapeutic intervention in order to improve the following deficits and impairments:  Abnormal gait, Decreased activity tolerance, Decreased balance, Decreased coordination, Decreased endurance, Decreased knowledge of precautions, Decreased knowledge of use of DME, Decreased mobility, Decreased safety awareness, Decreased strength, Impaired perceived functional ability, Impaired flexibility, Impaired sensation, Impaired UE functional use, Postural dysfunction  Visit Diagnosis: Other disturbances of skin sensation  Unsteadiness on feet  Other abnormalities of gait and mobility  Repeated falls     Problem List Patient Active Problem List   Diagnosis Date Noted  . Paresthesia 02/24/2018  . Gait abnormality 02/24/2018  . Weakness 02/24/2018  . Tobacco use disorder 02/15/2018  . Community acquired pneumonia 02/15/2018  . Essential hypertension 02/15/2018  . Poorly controlled type 2 diabetes mellitus with peripheral neuropathy ([place]) 02/15/2018   . Rheumatoid arthritis ([place]) 02/15/2018    Bjorn Loser, PTA  04/18/18, 1:04 PM [facility] [address] [place], [place], [address] Phone: [phone]   Fax:  [phone]  Name: Kasem Mozer MRN: [id number] Date of Birth: 04-May-1960

## 2018-04-20 ENCOUNTER — Ambulatory Visit: Payer: Medicaid Other | Admitting: Physical Therapy

## 2018-04-20 ENCOUNTER — Other Ambulatory Visit: Payer: Self-pay

## 2018-04-20 ENCOUNTER — Encounter: Payer: Self-pay | Admitting: Physical Therapy

## 2018-04-20 ENCOUNTER — Ambulatory Visit: Payer: Medicaid Other | Attending: Family Medicine | Admitting: Physician Assistant

## 2018-04-20 VITALS — BP 146/98 | HR 85 | Temp 97.7°F | Resp 16 | Wt 144.6 lb

## 2018-04-20 DIAGNOSIS — B353 Tinea pedis: Secondary | ICD-10-CM

## 2018-04-20 DIAGNOSIS — L03119 Cellulitis of unspecified part of limb: Secondary | ICD-10-CM | POA: Diagnosis not present

## 2018-04-20 DIAGNOSIS — R2689 Other abnormalities of gait and mobility: Secondary | ICD-10-CM

## 2018-04-20 DIAGNOSIS — R296 Repeated falls: Secondary | ICD-10-CM

## 2018-04-20 DIAGNOSIS — E1165 Type 2 diabetes mellitus with hyperglycemia: Secondary | ICD-10-CM | POA: Diagnosis not present

## 2018-04-20 DIAGNOSIS — R2681 Unsteadiness on feet: Secondary | ICD-10-CM

## 2018-04-20 DIAGNOSIS — R208 Other disturbances of skin sensation: Secondary | ICD-10-CM | POA: Diagnosis not present

## 2018-04-20 DIAGNOSIS — E1142 Type 2 diabetes mellitus with diabetic polyneuropathy: Secondary | ICD-10-CM | POA: Diagnosis not present

## 2018-04-20 DIAGNOSIS — L02619 Cutaneous abscess of unspecified foot: Secondary | ICD-10-CM

## 2018-04-20 LAB — GLUCOSE, POCT (MANUAL RESULT ENTRY): POC Glucose: 152 mg/dl — AB (ref 70–99)

## 2018-04-20 MED ORDER — CLOTRIMAZOLE-BETAMETHASONE 1-0.05 % EX CREA: 1.0000 "application " | TOPICAL_CREAM | Freq: Two times a day (BID) | CUTANEOUS | 0 refills | Status: DC

## 2018-04-20 MED ORDER — DOXYCYCLINE HYCLATE 100 MG PO TABS: 100.0000 mg | ORAL_TABLET | Freq: Two times a day (BID) | ORAL | 0 refills | Status: DC

## 2018-04-20 MED FILL — CLOTRIMAZOLE-BETAMETHASONE: 1-0.05 | 15 days supply | Qty: 30 | Fill #0

## 2018-04-20 MED FILL — predniSONE 5 MG TABS: 5 | 30 days supply | Qty: 30 | Fill #1

## 2018-04-20 MED FILL — DOXYCYCLINE HYCLATE 100 MG: 100 | 10 days supply | Qty: 20 | Fill #0

## 2018-04-20 NOTE — Therapy (Addendum)
[facility] [address] [place], [place], [address] Phone: [phone]   Fax:  [phone]  Physical Therapy Treatment  Patient Details  Name: Tony Ortega MRN: [id number] Date of Birth: Mar 14, 1960 Referring Provider (PT): [name], MD   Encounter Date: 04/20/2018    04/20/18 1129  PT Visits / Re-Eval  Visit Number 12  Number of Visits 15 (eval + 3 + 3 + 8)  Date for PT Re-Evaluation 04/24/18 (per updated POC)  Authorization  Authorization Type Medicaid approved 3 visits from 1/6-2/5, then 8 visits from 2/17-3/15; awaiting approval of more visits (asking for 6 more)  Authorization - Visit Number 6  Authorization - Number of Visits 8  PT Time Calculation  PT Start Time [phone]  PT Stop Time 1015  PT Time Calculation (min) 39 min  PT - End of Session  Activity Tolerance Patient tolerated treatment well  Behavior During Therapy [employer] for tasks assessed/performed     Past Medical History:  Diagnosis Date  . Diabetes mellitus without complication ([place])   . Hypertension   . Neuropathy   . Rheumatoid arthritis [facility])     Past Surgical History:  Procedure Laterality Date  . KNEE SURGERY Right     There were no vitals filed for this visit.  Subjective Assessment - 04/20/18 0945    Subjective  L foot is hurting a lot more today, having to wear to slide ons due to pain on top of foot.  Also has skin color changes on top of L foot.  Is going to see the MD about it today at 3:30.    Pertinent History  DM II with peripheral neuropathy, RA, HTN     Limitations  Walking    Patient Stated Goals  "For me and you to be able to walk without the cane."     Currently in Pain?  Yes    Pain Score  10-Worst pain ever    Pain Location  Foot    Pain Orientation  Left;Upper    Pain Descriptors / Indicators  Throbbing;Burning;Other (Comment)   itching   Pain Onset  In the past 7 days         [facility] PT Assessment -  04/20/18 0949      Assessment   Medical Diagnosis  Gait abnormality    Referring Provider (PT)  [name], MD      Precautions   Precautions  Fall      Prior Function   Level of Independence  Independent with basic ADLs;Independent with household mobility with device;Independent with community mobility with device      Standardized Balance Assessment   Standardized Balance Assessment  Berg Balance Test;Five Times Sit to Stand;10 meter walk test    Five times sit to stand comments   32.66 seconds with L foot forwards, use of UE on mat due to increased pain in L foot    10 Meter Walk  17.56 seconds or 1.8 ft/sec with cane and antalgic gait      Berg Balance Test   Sit to Stand  Able to stand  independently using hands    Standing Unsupported  Able to stand 2 minutes with supervision    Sitting with Back Unsupported but Feet Supported on Floor or Stool  Able to sit safely and securely 2 minutes    Stand to Sit  Controls descent by using hands    Transfers  Able to transfer safely, definite need of hands  Standing Unsupported with Eyes Closed  Able to stand 3 seconds    Standing Unsupported with Feet Together  Needs help to attain position but able to stand for 30 seconds with feet together    From Standing, Reach Forward with Outstretched Arm  Reaches forward but needs supervision    From Standing Position, Pick up Object from [place] to pick up shoe, needs supervision    From Standing Position, Turn to Look Behind Over each Shoulder  Needs supervision when turning    Turn 360 Degrees  Needs close supervision or verbal cueing    Standing Unsupported, Alternately Place Feet on Step/Stool  Able to complete 4 steps without aid or supervision    Standing Unsupported, One Foot in [place] to take small step independently and hold 30 seconds    Standing on One Leg  Tries to lift leg/unable to hold 3 seconds but remains standing independently    Total Score  30    Berg comment:   30/56                           PT Education - 04/20/18 1128    Education Details  goals addressed; will request more visits from [place] to continue to address balance    Person(s) Educated  Patient    Methods  Explanation    Comprehension  Verbalized understanding       PT Short Term Goals - 02/18/18 0915      PT SHORT TERM GOAL #1   Title  Pt will initiate HEP in order to indicate decreased fall risk and improved functional mobility. (Target Date: Following 3rd visit)    Baseline  relays now independent and shows good understanding of this today    Status  Achieved      PT SHORT TERM GOAL #2   Title  Pt will demonstrate safe technique using SPC vs RW in order to indicate decreased fall risk.     Baseline  using SPC mod I to supervision 115 ft X 2    Status  Achieved      PT SHORT TERM GOAL #3   Title  Will perform BERG in order to formally assess balance/fall risk.  LTG to follow    Baseline  score of 31    Status  Achieved        PT Long Term Goals - 04/20/18 0946      PT LONG TERM GOAL #1   Title  Pt will be independent with on-going HEP in order to indicate decreased fall risk and improved functional mobility.  (Target Date: Following 8th visit)    Baseline  unable to perform this week due to foot pain    Time  4    Period  Weeks    Status  Partially Met      PT LONG TERM GOAL #2   Title  Pt will improve BERG balance test score to 42/56 in order to indicate decreased fall risk.     Baseline  30/56 decreased due to foot pain    Time  4    Period  Weeks    Status  Not Met      PT LONG TERM GOAL #3   Title  Pt will improve gait speed to >/=2.40 ft/sec w/ LRAD in order to indicate decreased fall risk.     Baseline  1.8 ft/sec with cane decreased due to foot pain  Status  Not Met      PT LONG TERM GOAL #4   Title  Pt will perform 5TSS in </=25 secs without UE support in order to indicate decreased fall risk and improved functional strength.      Baseline  32 seconds - time increased due to foot pain    Status  Not Met      PT LONG TERM GOAL #5   Title  Will assess AFO needs and request order as appropriate to decrease fall risk.     Baseline  deferred as he has had no instances of foot catching in 2 weeks    Status  Deferred        New goals for new certification period - to be met in 5 visits  PT Short Term Goals - 04/20/18 1311      PT SHORT TERM GOAL #1   Title  = LTG      PT Long Term Goals - 04/20/18 1311      PT LONG TERM GOAL #1   Title  Pt will be independent with on-going HEP in order to indicate decreased fall risk and improved functional mobility.     Baseline  unable to perform this week due to foot pain    Time  6    Period  --   visits   Status  Revised      PT LONG TERM GOAL #2   Title  Pt will improve BERG balance test score to 42/56 in order to indicate decreased fall risk.     Baseline  30/56 decreased due to foot pain    Time  6    Period  --   visits   Status  Revised      PT LONG TERM GOAL #3   Title  Pt will improve gait speed to >/=2.40 ft/sec w/ LRAD in order to indicate decreased fall risk.     Baseline  1.8 ft/sec with cane decreased due to foot pain    Time  6    Period  --   visits   Status  Revised      PT LONG TERM GOAL #4   Title  Pt will perform 5TSS in </=25 secs without UE support in order to indicate decreased fall risk and improved functional strength.     Baseline  32 seconds - time increased due to foot pain    Time  6    Period  --   visits   Status  Revised          Plan - 04/20/18 1301    Clinical Impression Statement  Completed assessment of patient's progress towards LTG.  Pt has made slow progress and has met 0/4 goals, one goal deferred (AFO) due to pt refusal.  Barriers to patient progress include cancellation of two visits due to weather and ongoing medical issues that limit participation (L foot pain, low blood sugars).  Pt continues to present with  significant impairments in LE strength, standing balance, safety during gait and continues to be at high risk for falls.  Pt would benefit from extension of approval date to complete final 5 visits and for therapy to continue to address these impairments to maximize functional mobility independence and decrease falls risk.    Rehab Potential  Good    PT Frequency  2x / week    PT Duration  3 weeks    PT Treatment/Interventions  ADLs/Self Care Home Management;Aquatic Therapy;Dealer  Stimulation;DME Instruction;Gait training;Stair training;Functional mobility training;Therapeutic activities;Therapeutic exercise;Balance training;Neuromuscular re-education;Patient/family education;Orthotic Fit/Training;Passive range of motion;Dry needling;Vestibular    PT Next Visit Plan  Upper trunk and proximal hip stability - pt over utilizes hip strategy.  Bil LE strengthening in quadruped;  continue with gait, balance, compliant sufaces.    Consulted and Agree with Plan of Care  Patient       Patient will benefit from skilled therapeutic intervention in order to improve the following deficits and impairments:  Abnormal gait, Decreased activity tolerance, Decreased balance, Decreased coordination, Decreased endurance, Decreased knowledge of precautions, Decreased knowledge of use of DME, Decreased mobility, Decreased safety awareness, Decreased strength, Impaired perceived functional ability, Impaired flexibility, Impaired sensation, Impaired UE functional use, Postural dysfunction  Visit Diagnosis: Other disturbances of skin sensation  Unsteadiness on feet  Other abnormalities of gait and mobility  Repeated falls     Problem List Patient Active Problem List   Diagnosis Date Noted  . Paresthesia 02/24/2018  . Gait abnormality 02/24/2018  . Weakness 02/24/2018  . Tobacco use disorder 02/15/2018  . Community acquired pneumonia 02/15/2018  . Essential hypertension 02/15/2018  . Poorly controlled type  2 diabetes mellitus with peripheral neuropathy ([place]) 02/15/2018  . Rheumatoid arthritis ([place]) 02/15/2018   Rico Junker, PT, DPT 04/20/18    1:10 PM    [facility] [address] [place], [place], [address] Phone: [phone]   Fax:  [phone]  Name: Tony Ortega MRN: [id number] Date of Birth: Dec 01, 1960

## 2018-04-20 NOTE — Progress Notes (Signed)
Patient ID: Tony Ortega, male   DOB: 1960/08/24, 57 y.o.   MRN: [id number]   Tony Ortega, is a 58 y.o. male  YBO:[id number]  IDP:[id number]  DOB - 1960-10-30  Subjective:  Chief Complaint and HPI: Tony Ortega is a 58 y.o. male here today with worsening rash and pain on L foot.  Mild itching and pain is burning in nature.  Worsened last night.  No fever.  No known bite or injury.    ROS:   Constitutional:  No f/c, No night sweats, No unexplained weight loss. EENT:  No vision changes, No blurry vision, No hearing changes. No mouth, throat, or ear problems.  Respiratory: No cough, No SOB Cardiac: No CP, no palpitations GI:  No abd pain, No N/V/D. GU: No Urinary s/sx Musculoskeletal: L foot pain Neuro: No headache, no dizziness, no motor weakness.  Skin: No rash Endocrine:  No polydipsia. No polyuria.  Psych: Denies SI/HI  No problems updated.  ALLERGIES: Allergies  Allergen Reactions  . Pork-Derived Products Other (See Comments)    No pork products    PAST MEDICAL HISTORY: Past Medical History:  Diagnosis Date  . Diabetes mellitus without complication ([place])   . Hypertension   . Neuropathy   . Rheumatoid arthritis ([place])     MEDICATIONS AT HOME: Prior to Admission medications   Medication Sig Start Date End Date Taking? Authorizing Provider  ACCU-CHEK FASTCLIX LANCETS MISC Use as directed twice daily to test blood sugar. 03/21/18   Fulp, Cammie, MD  amitriptyline (ELAVIL) 25 MG tablet Take 1 tablet (25 mg total) by mouth at bedtime. 03/21/18   Fulp, Cammie, MD  amLODipine (NORVASC) 5 MG tablet Take 1 tablet (5 mg total) by mouth daily. To lower blood pressure 03/21/18   Fulp, Cammie, MD  aspirin 325 MG tablet Take 325 mg by mouth daily.    [provider]  atorvastatin (LIPITOR) 20 MG tablet Take 1 tablet (20 mg total) by mouth daily. 03/21/18   Fulp, Cammie, MD  Blood Glucose Monitoring Suppl (ACCU-CHEK GUIDE) w/Device KIT 1 each by Does not apply route 2 (two)  times daily. Use as directed twice daily to test blood sugar. 03/21/18   Fulp, Cammie, MD  clotrimazole-betamethasone (LOTRISONE) cream Apply 1 application topically 2 (two) times daily. 04/20/18   Argentina Donovan, PA-C  doxycycline (VIBRA-TABS) 100 MG tablet Take 1 tablet (100 mg total) by mouth 2 (two) times daily. 04/20/18   Argentina Donovan, PA-C  gabapentin (NEURONTIN) 300 MG capsule Take 2 capsules (600 mg total) by mouth 3 (three) times daily. 03/21/18   Fulp, Cammie, MD  glucose blood (ACCU-CHEK GUIDE) test strip Use as instructed to check blood sugars up to twice per day 03/21/18   Fulp, Cammie, MD  hydrochlorothiazide (HYDRODIURIL) 25 MG tablet Take 1 tablet (25 mg total) by mouth daily. 03/21/18   Fulp, Cammie, MD  ketoconazole (NIZORAL) 2 % cream Apply 1 application topically daily. Apply to both feet and between toes once daily for 6 weeks 03/21/18   Fulp, Cammie, MD  metFORMIN (GLUCOPHAGE) 1000 MG tablet Take 1 tablet (1,000 mg total) by mouth 2 (two) times daily with a meal. 03/21/18   Fulp, Cammie, MD  omeprazole (PRILOSEC) 20 MG capsule Take 1 capsule (20 mg total) by mouth daily. To reduce stomach acid 03/28/18   Fulp, Cammie, MD     Objective:  EXAM:   Vitals:   04/20/18 1523  BP: (!) 146/98  Pulse: 85  Resp: 16  Temp:  97.7 F (36.5 C)  TempSrc: Oral  SpO2: 96%  Weight: 144 lb 9.6 oz (65.6 kg)    General appearance : A&OX3. NAD. Non-toxic-appearing HEENT: Atraumatic and Normocephalic.  PERRLA. EOM intact.  Chest/Lungs:  Breathing-non-labored, Good air entry bilaterally, breath sounds normal without rales, rhonchi, or wheezing  CVS: S1 S2 regular, no murmurs, gallops, rubs  Extremities: L foot slightly swollen and erythematous.  There is a dry rash on the dorsum of the foot with mild erythema.  No streaking.   Neurology:  CN II-XII grossly intact, Non focal.   Psych:  TP linear. J/I WNL. Normal speech. Appropriate eye contact and affect.  Skin:  No Rash  Data Review  Lab Results  Component Value Date   HGBA1C 6.0 01/10/2018     Assessment & Plan   1. Cellulitis and abscess of foot Likely due to broken skin resulting from #2 - doxycycline (VIBRA-TABS) 100 MG tablet; Take 1 tablet (100 mg total) by mouth 2 (two) times daily.  Dispense: 20 tablet; Refill: 0  2. Poorly controlled type 2 diabetes mellitus with peripheral neuropathy ([place]) Work on diet, continue current regimen - Glucose (CBG)  3. Tinea pedis of left foot - clotrimazole-betamethasone (LOTRISONE) cream; Apply 1 application topically 2 (two) times daily.  Dispense: 30 g; Refill: 0   Patient have been counseled extensively about nutrition and exercise  Return for appt with me next week on Thursday for recheck foot.  The patient was given clear instructions to go to ER or return to medical center if symptoms don't improve, worsen or new problems develop. The patient verbalized understanding. The patient was told to call to get lab results if they haven't heard anything in the next week.     Tony Caldron, PA-C [facility] and [place], [place]   04/20/2018, 3:36 PM

## 2018-04-20 NOTE — Patient Instructions (Signed)

## 2018-04-21 NOTE — Addendum Note (Signed)
Addended by: Rico Junker on: 04/21/2018 12:22 PM   Modules accepted: Orders

## 2018-04-22 ENCOUNTER — Telehealth: Payer: Self-pay | Admitting: Internal Medicine

## 2018-04-22 ENCOUNTER — Other Ambulatory Visit: Payer: Self-pay

## 2018-04-22 ENCOUNTER — Ambulatory Visit (HOSPITAL_COMMUNITY)
Admission: RE | Admit: 2018-04-22 | Discharge: 2018-04-22 | Disposition: A | Discharge status: Home / Self Care | Payer: Medicaid Other | Source: Ambulatory Visit | Attending: Internal Medicine | Admitting: Internal Medicine

## 2018-04-22 ENCOUNTER — Ambulatory Visit (HOSPITAL_BASED_OUTPATIENT_CLINIC_OR_DEPARTMENT_OTHER): Payer: Medicaid Other | Admitting: Internal Medicine

## 2018-04-22 ENCOUNTER — Telehealth: Payer: Self-pay

## 2018-04-22 ENCOUNTER — Other Ambulatory Visit: Payer: Self-pay | Admitting: Internal Medicine

## 2018-04-22 ENCOUNTER — Encounter: Payer: Self-pay | Admitting: Internal Medicine

## 2018-04-22 VITALS — BP 137/92 | HR 78 | Temp 98.1°F | Resp 16 | Wt 143.6 lb

## 2018-04-22 DIAGNOSIS — Z7982 Long term (current) use of aspirin: Secondary | ICD-10-CM

## 2018-04-22 DIAGNOSIS — E1165 Type 2 diabetes mellitus with hyperglycemia: Secondary | ICD-10-CM | POA: Insufficient documentation

## 2018-04-22 DIAGNOSIS — Z79899 Other long term (current) drug therapy: Secondary | ICD-10-CM | POA: Insufficient documentation

## 2018-04-22 DIAGNOSIS — I10 Essential (primary) hypertension: Secondary | ICD-10-CM | POA: Insufficient documentation

## 2018-04-22 DIAGNOSIS — R1084 Generalized abdominal pain: Secondary | ICD-10-CM

## 2018-04-22 DIAGNOSIS — E1142 Type 2 diabetes mellitus with diabetic polyneuropathy: Secondary | ICD-10-CM

## 2018-04-22 DIAGNOSIS — Z7984 Long term (current) use of oral hypoglycemic drugs: Secondary | ICD-10-CM | POA: Insufficient documentation

## 2018-04-22 DIAGNOSIS — R269 Unspecified abnormalities of gait and mobility: Secondary | ICD-10-CM | POA: Insufficient documentation

## 2018-04-22 DIAGNOSIS — M069 Rheumatoid arthritis, unspecified: Secondary | ICD-10-CM | POA: Insufficient documentation

## 2018-04-22 MED FILL — HYDROCHLOROTHIAZIDE 25 MG T: 25 | 90 days supply | Qty: 90 | Fill #1

## 2018-04-22 MED FILL — ACCU-CHEK GUIDE TEST STRIP: 30 days supply | Qty: 100 | Fill #1

## 2018-04-22 MED FILL — ACCU-CHEK FASTCLIX LANCETS: 30 days supply | Qty: 102 | Fill #1

## 2018-04-22 MED FILL — AMLODIPINE BESYLATE 5 MG TA: 5 | 90 days supply | Qty: 90 | Fill #1

## 2018-04-22 MED FILL — AMITRIPTYLINE HCL 25 MG TAB: 25 | 90 days supply | Qty: 90 | Fill #1

## 2018-04-22 MED FILL — ATORVASTATIN 20 MG TABLET: 20 | 90 days supply | Qty: 90 | Fill #1

## 2018-04-22 MED FILL — OMEPRAZOLE 20 MG CAP: 20 | 90 days supply | Qty: 90 | Fill #1

## 2018-04-22 MED FILL — KETOCONAZOLE 2% CREAM: 2 | 7 days supply | Qty: 15 | Fill #1

## 2018-04-22 MED FILL — metFORMIN HCL 1000 MG TABS: 1000 | 90 days supply | Qty: 180 | Fill #1

## 2018-04-22 MED FILL — GABAPENTIN 300 MG CAPSULE: 300 | 90 days supply | Qty: 540 | Fill #1

## 2018-04-22 NOTE — Telephone Encounter (Signed)
Went on the [employer] and did prior auth for [place]    CPT- [id number] Ultrasound, abdominal, B-scan and/or real time with image documentation; limited  ICD- R10.84 Generalized abdominal pain   Authorization # [id number]  Effective- 04/22/2018 Expires- 05/22/2018   Contacted the scheduling department and spoke to [place] provided authorization # and got an appointment for the [place]

## 2018-04-22 NOTE — Progress Notes (Signed)
 Patient ID: Yassir Zanella, male    DOB: 02/28/1960  MRN: [id number]  CC: Stomach pain  Subjective:  Koy Klausing is a 58 y.o. male who presents for UC visit. Pt of Dr. Fulp His concerns today include:  Patient of Dr. Fulp with history of diabetes type 2, HTN, HL, RA, tob dep  Pt c/o generalized abdominal pain x 1 mth.  He describes it as a generalized pain and a tightness where his abdomen feels distended.  Though he had constipation so took OTC laxative.  He had a good bowel movement with a laxative but it did not improve his symptoms.   No burning.  Abdomen feels real tight. No N/V/D Symptoms worse when he eats; "the stomach feels tighter and once it gets tight, it bother my breathing."  He has  not paid attention to see whether it is worse with any particular types of foods.  States that he has not been able to eat a full meal because of the pain.  He has to eat a little bit every few hours. Requested Omeprazole 1 mth ago for possible GERD.  "But my stomach swells up more when I takes it." Last ate last evening.   Currently rates pain as 12/10 Drinks 1-2 8 oz cans per day.  Does not use any street drugs  Patient Active Problem List   Diagnosis Date Noted  . Paresthesia 02/24/2018  . Gait abnormality 02/24/2018  . Weakness 02/24/2018  . Tobacco use disorder 02/15/2018  . Community acquired pneumonia 02/15/2018  . Essential hypertension 02/15/2018  . Poorly controlled type 2 diabetes mellitus with peripheral neuropathy ([facility]) 02/15/2018  . Rheumatoid arthritis ([facility]) 02/15/2018     Current Outpatient Medications on File Prior to Visit  Medication Sig Dispense Refill  . ACCU-CHEK FASTCLIX LANCETS MISC Use as directed twice daily to test blood sugar. 102 each 12  . amitriptyline (ELAVIL) 25 MG tablet Take 1 tablet (25 mg total) by mouth at bedtime. 30 tablet 6  . amLODipine (NORVASC) 5 MG tablet Take 1 tablet (5 mg total) by mouth daily. To lower blood pressure 30 tablet 6  .  aspirin 325 MG tablet Take 325 mg by mouth daily.    . atorvastatin (LIPITOR) 20 MG tablet Take 1 tablet (20 mg total) by mouth daily. 30 tablet 6  . Blood Glucose Monitoring Suppl (ACCU-CHEK GUIDE) w/Device KIT 1 each by Does not apply route 2 (two) times daily. Use as directed twice daily to test blood sugar. 1 kit 0  . clotrimazole-betamethasone (LOTRISONE) cream Apply 1 application topically 2 (two) times daily. 30 g 0  . doxycycline (VIBRA-TABS) 100 MG tablet Take 1 tablet (100 mg total) by mouth 2 (two) times daily. 20 tablet 0  . gabapentin (NEURONTIN) 300 MG capsule Take 2 capsules (600 mg total) by mouth 3 (three) times daily. 180 capsule 11  . glucose blood (ACCU-CHEK GUIDE) test strip Use as instructed to check blood sugars up to twice per day 100 each 12  . hydrochlorothiazide (HYDRODIURIL) 25 MG tablet Take 1 tablet (25 mg total) by mouth daily. 30 tablet 6  . ketoconazole (NIZORAL) 2 % cream Apply 1 application topically daily. Apply to both feet and between toes once daily for 6 weeks 30 g 1  . metFORMIN (GLUCOPHAGE) 1000 MG tablet Take 1 tablet (1,000 mg total) by mouth 2 (two) times daily with a meal. 60 tablet 6  . omeprazole (PRILOSEC) 20 MG capsule Take 1 capsule (20 mg total) by   mouth daily. To reduce stomach acid 30 capsule 11   No current facility-administered medications on file prior to visit.     Allergies  Allergen Reactions  . Pork-Derived Products Other (See Comments)    No pork products     ROS: Review of Systems Negative except as above  PHYSICAL EXAM: BP (!) 137/92   Pulse 78   Temp 98.1 F (36.7 C) (Oral)   Resp 16   Wt 143 lb 9.6 oz (65.1 kg)   SpO2 100%   BMI 23.18 kg/m   Physical Exam  General appearance - alert, well appearing, older African-American male and in no distress Mental status -patient is alert and answers questions appropriately.   Chest -mildly decreased bilaterally. Heart - normal rate, regular rhythm, normal S1, S2, no  murmurs, rubs, clicks or gallops Abdomen -patient abdomen protrudes and does feel tight.  I do not appreciate a fluid wave.  He is tender in the epigastric, right upper and mid portions of the abdomen.  No guarding or rebound  ASSESSMENT AND PLAN: 1. Generalized abdominal pain On exam tenderness seems to localize in the epigastric and right upper and mid portion of the abdomen.  Differential diagnoses include gallbladder disease, pancreatitis, constipation and dyspepsia.  Since he was rating his pain score so high, I offered to send him to the emergency room versus trying to do some systematic work-up as an outpatient.  Patient opted for the latter.  See studies ordered below. - Lipase - Comprehensive metabolic panel; Future - DG ABD ACUTE 2+V W 1V CHEST; Future - US Abdomen Limited RUQ; Future - H. pylori breath test  Patient was given the opportunity to ask questions.  Patient verbalized understanding of the plan and was able to repeat key elements of the plan.   Orders Placed This Encounter  Procedures  . DG ABD ACUTE 2+V W 1V CHEST  . US Abdomen Limited RUQ  . Lipase  . Comprehensive metabolic panel  . H. pylori breath test     Requested Prescriptions    No prescriptions requested or ordered in this encounter    Future Appointments  Date Time Provider Department Center  04/22/2018  3:00 PM [facility] 2 [facility]  04/25/2018  9:00 AM Gann, Nicole, CMA [facility] None  04/25/2018 10:00 AM Yan, Yijun, MD [facility] None  04/27/2018  8:45 AM Hicks, Karissa, PTA [facility]  05/02/2018  9:30 AM Hicks, Karissa, PTA [facility]  05/05/2018  9:30 AM Potter, Audra F, PT [facility]  06/16/2018 10:45 AM Galaway, Jennifer L, DPM [facility]  06/22/2018  8:30 AM Fulp, Cammie, MD [facility] None    Deborah Johnson, MD, FACP 

## 2018-04-22 NOTE — Telephone Encounter (Signed)
PC placed to pt.  Pt informed that the US revealed no acute gall bladder issues.  I am awaiting the results of x-ray of the abdomen and blood tests. Should be back by tomorrow and if any thing abnormal, I will call him then, otherwise, if everything looks okay, I will have my CMA call on Monday.  Pt thanked me for calling.

## 2018-04-23 LAB — COMPREHENSIVE METABOLIC PANEL
ALT: 35 IU/L (ref 0–44)
AST: 62 IU/L — ABNORMAL HIGH (ref 0–40)
Albumin/Globulin Ratio: 1.3 (ref 1.2–2.2)
Albumin: 4.5 g/dL (ref 3.8–4.9)
Alkaline Phosphatase: 84 IU/L (ref 39–117)
BUN/Creatinine Ratio: 11 (ref 10–24)
BUN: 10 mg/dL (ref 8–27)
Bilirubin Total: 0.7 mg/dL (ref 0.0–1.2)
CO2: 23 mmol/L (ref 20–29)
Calcium: 9.9 mg/dL (ref 8.6–10.2)
Chloride: 102 mmol/L (ref 96–106)
Creatinine, Ser: 0.94 mg/dL (ref 0.76–1.27)
GFR calc Af Amer: 101 mL/min/{1.73_m2} (ref >59)
GFR calc non Af Amer: 88 mL/min/{1.73_m2} (ref >59)
Globulin, Total: 3.4 g/dL (ref 1.5–4.5)
Glucose: 112 mg/dL — ABNORMAL HIGH (ref 65–99)
Potassium: 5 mmol/L (ref 3.5–5.2)
Sodium: 143 mmol/L (ref 134–144)
Total Protein: 7.9 g/dL (ref 6.0–8.5)

## 2018-04-23 LAB — LIPASE: Lipase: 25 U/L (ref 13–78)

## 2018-04-24 LAB — H. PYLORI BREATH TEST: H pylori Breath Test: POSITIVE — AB

## 2018-04-25 ENCOUNTER — Ambulatory Visit: Payer: Medicaid Other | Admitting: Rehabilitation

## 2018-04-25 ENCOUNTER — Other Ambulatory Visit: Payer: Self-pay | Admitting: Internal Medicine

## 2018-04-25 ENCOUNTER — Encounter: Payer: Medicaid Other | Admitting: Neurology

## 2018-04-25 ENCOUNTER — Telehealth: Payer: Self-pay | Admitting: Neurology

## 2018-04-25 DIAGNOSIS — Z7982 Long term (current) use of aspirin: Secondary | ICD-10-CM

## 2018-04-25 MED ORDER — AMOXICILLIN 500 MG PO TABS: 1000.0000 mg | ORAL_TABLET | Freq: Two times a day (BID) | ORAL | 0 refills | Status: DC

## 2018-04-25 MED ORDER — OMEPRAZOLE 20 MG PO CPDR: 20.0000 mg | DELAYED_RELEASE_CAPSULE | Freq: Two times a day (BID) | ORAL | 0 refills | Status: DC

## 2018-04-25 MED ORDER — CLARITHROMYCIN 500 MG PO TABS: 500.0000 mg | ORAL_TABLET | Freq: Two times a day (BID) | ORAL | 0 refills | Status: DC

## 2018-04-25 NOTE — Telephone Encounter (Signed)
Please call patient's wife on DPR, Talib Headley, at [phone].  She will schedule his MRI scans.    He will need to be schedule for NCV/EMG after his MRI scans.

## 2018-04-25 NOTE — Telephone Encounter (Signed)
I have ordered MRI of cervical, lumbar, MRI of the brain, which has not been done,  Please find out MRI schedules, will schedule EMG nerve conduction study after MRIs

## 2018-04-26 ENCOUNTER — Telehealth: Payer: Self-pay

## 2018-04-26 MED FILL — AMOXICILLIN 500 MG CAPSULE: 500 | 14 days supply | Qty: 56 | Fill #0

## 2018-04-26 MED FILL — CLARITHROMYCIN 500 MG TAB: 500 | 14 days supply | Qty: 28 | Fill #0

## 2018-04-26 NOTE — Telephone Encounter (Signed)
Contacted pt to go over lab results pt answered then hung up will contact pt again   Contacted pt again to go over lab results pt answered and is aware of results. Pt states he will go to the hospital to get xray done today

## 2018-04-27 ENCOUNTER — Ambulatory Visit: Payer: Medicaid Other | Admitting: Physical Therapy

## 2018-04-28 ENCOUNTER — Ambulatory Visit: Payer: Medicaid Other | Admitting: Rehabilitation

## 2018-05-02 ENCOUNTER — Ambulatory Visit: Payer: Medicaid Other | Admitting: Physical Therapy

## 2018-05-03 ENCOUNTER — Telehealth: Payer: Self-pay | Admitting: Physical Therapy

## 2018-05-03 NOTE — Telephone Encounter (Signed)
Tony Ortega was contacted today regarding the temporary closing of [facility] due to Covid-19.  Therapist discussed:  Continuation of HEP, possible re-open date and advised pt to contact therapy clinic via phone with any questions or concerns that arose in the next couple of weeks.  Patient IS NOT interested in further information for an e-visit, virtual check in, or telehealth visit, if those services become available.    [facility] will follow up with patients when we are able to resume care.  Rico Junker, PT, DPT 05/03/18    11:18 AM    [facility] [address] [place], [place]  [address] Phone:  [phone] Fax:  [phone]

## 2018-05-05 ENCOUNTER — Ambulatory Visit: Payer: Medicaid Other | Admitting: Physical Therapy

## 2018-05-11 NOTE — Telephone Encounter (Signed)
Medicaid Josem Kaufmann: [id number] (exp. 05/05/18 to 11/01/18) patient is scheduled at GI for 07/07/18.

## 2018-05-18 ENCOUNTER — Other Ambulatory Visit: Payer: Medicaid Other

## 2018-06-09 MED FILL — predniSONE 5 MG TABS: 5 | 30 days supply | Qty: 30 | Fill #2

## 2018-06-09 MED FILL — ACCU-CHEK GUIDE TEST STRIP: 30 days supply | Qty: 100 | Fill #2

## 2018-06-16 ENCOUNTER — Ambulatory Visit: Payer: Medicaid Other | Admitting: Podiatry

## 2018-06-20 ENCOUNTER — Ambulatory Visit: Payer: Medicaid Other | Admitting: Podiatry

## 2018-06-22 ENCOUNTER — Other Ambulatory Visit: Payer: Self-pay

## 2018-06-22 ENCOUNTER — Encounter: Payer: Self-pay | Admitting: Family Medicine

## 2018-06-22 ENCOUNTER — Ambulatory Visit: Payer: Medicaid Other | Attending: Family Medicine | Admitting: Family Medicine

## 2018-06-22 DIAGNOSIS — Z7982 Long term (current) use of aspirin: Secondary | ICD-10-CM | POA: Insufficient documentation

## 2018-06-22 DIAGNOSIS — M069 Rheumatoid arthritis, unspecified: Secondary | ICD-10-CM | POA: Diagnosis not present

## 2018-06-22 DIAGNOSIS — E785 Hyperlipidemia, unspecified: Secondary | ICD-10-CM

## 2018-06-22 DIAGNOSIS — E1169 Type 2 diabetes mellitus with other specified complication: Secondary | ICD-10-CM | POA: Insufficient documentation

## 2018-06-22 DIAGNOSIS — I10 Essential (primary) hypertension: Secondary | ICD-10-CM

## 2018-06-22 DIAGNOSIS — E1142 Type 2 diabetes mellitus with diabetic polyneuropathy: Secondary | ICD-10-CM

## 2018-06-22 DIAGNOSIS — Z8619 Personal history of other infectious and parasitic diseases: Secondary | ICD-10-CM | POA: Insufficient documentation

## 2018-06-22 DIAGNOSIS — K219 Gastro-esophageal reflux disease without esophagitis: Secondary | ICD-10-CM | POA: Insufficient documentation

## 2018-06-22 DIAGNOSIS — Z8673 Personal history of transient ischemic attack (TIA), and cerebral infarction without residual deficits: Secondary | ICD-10-CM | POA: Insufficient documentation

## 2018-06-22 MED ORDER — ATORVASTATIN CALCIUM 20 MG PO TABS: 20.0000 mg | ORAL_TABLET | Freq: Every day | ORAL | 1 refills | Status: DC

## 2018-06-22 MED ORDER — AMLODIPINE BESYLATE 5 MG PO TABS: 5.0000 mg | ORAL_TABLET | Freq: Every day | ORAL | 6 refills | Status: DC

## 2018-06-22 MED ORDER — METFORMIN HCL 500 MG PO TABS: 500.0000 mg | ORAL_TABLET | Freq: Every day | ORAL | 1 refills | Status: DC

## 2018-06-22 MED ORDER — HYDROCHLOROTHIAZIDE 25 MG PO TABS: 25.0000 mg | ORAL_TABLET | Freq: Every day | ORAL | 1 refills | Status: DC

## 2018-06-22 MED ORDER — OMEPRAZOLE 20 MG PO CPDR: 20.0000 mg | DELAYED_RELEASE_CAPSULE | Freq: Every day | ORAL | 4 refills | Status: DC

## 2018-06-22 MED ORDER — PREDNISONE 5 MG PO TABS: 5.0000 mg | ORAL_TABLET | Freq: Every day | ORAL | 1 refills | Status: DC

## 2018-06-22 NOTE — Progress Notes (Signed)
3 mo f/u for abd pain and he is feeling fine. Per pt he is needing refills for his medications.   Patient would like to talk about his Prednisone.

## 2018-06-22 NOTE — Progress Notes (Signed)
Virtual Visit via Telephone Note  I connected with Tony Ortega on 06/22/18 at  8:30 AM EDT by telephone and verified that I am speaking with the correct person using two identifiers.   I discussed the limitations, risks, security and privacy concerns of performing an evaluation and management service by telephone and the availability of in person appointments. I also discussed with the patient that there may be a patient responsible charge related to this service. The patient expressed understanding and agreed to proceed.  Patient Location: Home Provider Location: Office Others participating in call: Emilio Aspen, CMA   History of Present Illness:      58 yo male with well-controlled type 2 diabetes, hypertension, hyperlipidemia, GERD status post treatment for positive H. pylori test, and rheumatoid arthritis who is being "seen" for continued medical management and follow-up of long-term medical conditions.  He reports that overall he is feeling very well.  He is concerned due to the current COVID-19 pandemic and his multiple health conditions which increase his risk of contracting COVID-19.  He reports that he has mostly been at home.  He is watching his diet.  Patient reports that he often does not take the metformin because he had an adverse reaction in the past after he had taken metformin and his blood sugar dropped to 60.  Patient states that if his blood sugars are elevated, he will take one half of 1000 mg tablet with breakfast. (Current prescription is for metformin 1000 mg twice daily).  He denies any increased thirst, no blurred vision and no frequent urination related to his diabetes.      Patient reports that his blood pressures been well controlled on his current medications.  He has had no headaches or dizziness related to his blood pressure.  No increase in muscle or joint pain related to his use of atorvastatin.  He reports that he has been on prednisone for almost 18 years for  treatment of rheumatoid arthritis.  He was previously on 10 mg and is now on 5 mg daily.  Patient reports that if he runs out of medication he develops severe joint pain all over.  He currently has no issues with joint pain.  Patient continues to take omeprazole and he reports no abdominal pain, no nausea.  He feels that his reflux symptoms are well controlled and abdominal pain resolved after treatment for H. Pylori.  He continues to attend physical therapy to help with abnormal gait.  Patient Active Problem List   Diagnosis Date Noted  . Hyperlipidemia associated with type 2 diabetes mellitus ([place]) 06/22/2018  . Long term current use of aspirin 06/22/2018  . Gastroesophageal reflux disease 06/22/2018  . History of CVA (cerebrovascular accident) 06/22/2018  . History of Helicobacter pylori infection 06/22/2018  . Paresthesia 02/24/2018  . Gait abnormality 02/24/2018  . Weakness 02/24/2018  . Tobacco use disorder 02/15/2018  . Community acquired pneumonia 02/15/2018  . Essential hypertension 02/15/2018  . Type 2 diabetes, controlled, with peripheral neuropathy ([place]) 02/15/2018  . Rheumatoid arthritis ([place]) 02/15/2018     Current Outpatient Medications on File Prior to Visit  Medication Sig Dispense Refill  . ACCU-CHEK FASTCLIX LANCETS MISC Use as directed twice daily to test blood sugar. 102 each 12  . amitriptyline (ELAVIL) 25 MG tablet Take 1 tablet (25 mg total) by mouth at bedtime. 30 tablet 6  . aspirin 325 MG tablet Take 325 mg by mouth daily.    . Blood Glucose Monitoring Suppl (ACCU-CHEK GUIDE)  w/Device KIT 1 each by Does not apply route 2 (two) times daily. Use as directed twice daily to test blood sugar. 1 kit 0  . clotrimazole-betamethasone (LOTRISONE) cream Apply 1 application topically 2 (two) times daily. 30 g 0  . gabapentin (NEURONTIN) 300 MG capsule Take 2 capsules (600 mg total) by mouth 3 (three) times daily. 180 capsule 11  . glucose blood (ACCU-CHEK GUIDE) test strip  Use as instructed to check blood sugars up to twice per day 100 each 12  . ketoconazole (NIZORAL) 2 % cream Apply 1 application topically daily. Apply to both feet and between toes once daily for 6 weeks 30 g 1  . amoxicillin (AMOXIL) 500 MG tablet Take 2 tablets (1,000 mg total) by mouth 2 (two) times daily. (Patient not taking: Reported on 06/22/2018) 56 tablet 0  . doxycycline (VIBRA-TABS) 100 MG tablet Take 1 tablet (100 mg total) by mouth 2 (two) times daily. (Patient not taking: Reported on 06/22/2018) 20 tablet 0   No current facility-administered medications on file prior to visit.     Allergies  Allergen Reactions  . Pork-Derived Products Other (See Comments)    No pork products    Social History   Tobacco Use  . Smoking status: Current Every Day Smoker    Packs/day: 0.10    Types: Cigarettes  . Smokeless tobacco: Never Used  Substance Use Topics  . Alcohol use: Not Currently  . Drug use: Never     Family History  Problem Relation Age of Onset  . Diabetes Mother   . Hypertension Mother   . Rheum arthritis Mother   . Other Father        unsure - thinks he may have history of cancer  . CAD Neg Hx     Past Surgical History:  Procedure Laterality Date  . KNEE SURGERY Right     ROS: Review of Systems  Constitutional: Negative for chills, fever and malaise/fatigue.  HENT: Negative for congestion and sore throat.   Eyes: Negative for blurred vision and double vision.  Respiratory: Negative for cough and shortness of breath.   Cardiovascular: Negative for chest pain and palpitations.  Gastrointestinal: Negative for abdominal pain, constipation, diarrhea, heartburn, nausea and vomiting.  Endocrine: Negative for polydipsia.  Genitourinary: Negative for dysuria and frequency.  Musculoskeletal: Negative for falls, joint pain and myalgias.  Skin: Negative for itching and rash.  Neurological: Negative for dizziness.  Hematological: Does not bruise/bleed easily.   Psychiatric/Behavioral: Negative for depression and suicidal ideas.   No vitals or physical exam done as this was a telmed visit  ASSESSMENT AND PLAN: 1. Essential hypertension Stable per patient report. Continue current medications which are being refilled.  Continue a DASH diet and regular exercise. - amLODipine (NORVASC) 5 MG tablet; Take 1 tablet (5 mg total) by mouth daily. To lower blood pressure  Dispense: 30 tablet; Refill: 6 - hydrochlorothiazide (HYDRODIURIL) 25 MG tablet; Take 1 tablet (25 mg total) by mouth daily.  Dispense: 90 tablet; Refill: 1  2. Hyperlipidemia associated with type 2 diabetes mellitus ([place]) Discussed with patient that his last LDL was at 64 with goal of 70 or less.  Continue healthy, low-fat diet.  Regular cardiovascular exercise encouraged.  Continue atorvastatin. - atorvastatin (LIPITOR) 20 MG tablet; Take 1 tablet (20 mg total) by mouth daily.  Dispense: 90 tablet; Refill: 1  3. Type 2 diabetes, controlled, with peripheral neuropathy [facility]) Patient reports that his blood sugars continue to remain well controlled.  Patient  is to continue the use of metformin but dose reduced to 500 mg once daily after breakfast.  Continue to monitor blood sugars, follow a healthy, low carbohydrate diet along with regular cardiovascular exercise - metFORMIN (GLUCOPHAGE) 500 MG tablet; Take 1 tablet (500 mg total) by mouth daily with breakfast.  Dispense: 90 tablet; Refill: 1  5,6,7,8. Long term current use of aspirin; history of CVA; GERD; History of positive H. pylori Continue the use of omeprazole 20 mg once daily for stomach protection due to the long-term use of aspirin because of a history of prior CVA.  Patient also with GERD and a history of positive H. pylori.  Avoid known trigger foods and avoid late night eating - omeprazole (PRILOSEC) 20 MG capsule; Take 1 capsule (20 mg total) by mouth daily. To reduce stomach acid  Dispense: 90 capsule; Refill: 4  4. Rheumatoid  arthritis involving multiple sites, unspecified rheumatoid factor presence ([place]) He reports long-term use of prednisone now at 5 mg for treatment of rheumatoid arthritis.  We will discussed rheumatology referral for further follow-up at patient's next visit.  Prednisone refilled. - predniSONE (DELTASONE) 5 MG tablet; Take 1 tablet (5 mg total) by mouth daily with breakfast.  Dispense: 90 tablet; Refill: 1   Future Appointments  Date Time Provider [place]  07/07/2018 11:50 AM GI-315 MR 1 GI-315MRI [facility] [facility]  07/07/2018 12:30 PM GI-315 MR 1 GI-315MRI [facility]  07/07/2018  1:10 PM GI-315 MR 1 GI-315MRI GI-[facility] [facility]    Follow Up Instructions:Return in about 4 months (around 10/23/2018) for DM/HTN and labs.    I discussed the assessment and treatment plan with the patient. The patient was provided an opportunity to ask questions and all were answered. The patient agreed with the plan and demonstrated an understanding of the instructions.   The patient was advised to call back or seek an in-person evaluation if the symptoms worsen or if the condition fails to improve as anticipated.  I provided 12 minutes of non-face-to-face time during this encounter.   [name], MD

## 2018-07-07 ENCOUNTER — Other Ambulatory Visit: Payer: Medicaid Other

## 2018-07-14 MED FILL — AMLODIPINE BESYLATE 5 MG TA: 5 | 30 days supply | Qty: 30 | Fill #0

## 2018-07-14 MED FILL — predniSONE 5 MG TABS: 5 | 30 days supply | Qty: 30 | Fill #0

## 2018-07-14 MED FILL — ATORVASTATIN 20 MG TABLET: 20 | 30 days supply | Qty: 30 | Fill #0

## 2018-07-14 MED FILL — ACCU-CHEK GUIDE TEST STRIP: 30 days supply | Qty: 100 | Fill #3

## 2018-07-14 MED FILL — HYDROCHLOROTHIAZIDE 25 MG T: 25 | 30 days supply | Qty: 30 | Fill #0

## 2018-07-14 MED FILL — OMEPRAZOLE 20 MG CAP: 20 | 30 days supply | Qty: 30 | Fill #0

## 2018-09-06 ENCOUNTER — Ambulatory Visit
Admission: RE | Admit: 2018-09-06 | Discharge: 2018-09-06 | Disposition: A | Discharge status: Home / Self Care | Payer: Medicaid Other | Source: Ambulatory Visit | Attending: Neurology | Admitting: Neurology

## 2018-09-06 ENCOUNTER — Other Ambulatory Visit: Payer: Self-pay

## 2018-09-06 DIAGNOSIS — R531 Weakness: Secondary | ICD-10-CM

## 2018-09-06 DIAGNOSIS — R202 Paresthesia of skin: Secondary | ICD-10-CM

## 2018-09-06 DIAGNOSIS — R269 Unspecified abnormalities of gait and mobility: Secondary | ICD-10-CM

## 2018-09-06 DIAGNOSIS — G3281 Cerebellar ataxia in diseases classified elsewhere: Secondary | ICD-10-CM

## 2018-09-07 ENCOUNTER — Telehealth: Payer: Self-pay | Admitting: Neurology

## 2018-09-07 DIAGNOSIS — R269 Unspecified abnormalities of gait and mobility: Secondary | ICD-10-CM

## 2018-09-07 DIAGNOSIS — G959 Disease of spinal cord, unspecified: Secondary | ICD-10-CM

## 2018-09-07 NOTE — Telephone Encounter (Signed)
I have spoken with the patient.  He is aware of all his MRI results.  He is agreeable to see neurosurgeon.

## 2018-09-07 NOTE — Telephone Encounter (Addendum)
I have called patient, I will refer him to neurosurgeon for cervical myelopathy, cervical cord compression, at C4-5, C6-7, which explain his gait abnormality   IMPRESSION: Abnormal MRI scan cervical spine showing prominent spondylitic disc degenerative changes at C 4-5 and C6-7 resulting in severe cord compression and cord signal abnormality.  MRI of brain showed no significant abnormalities.  MRI of lumbar showed mild degenerative changes, with variable degree of foraminal narrowing

## 2018-09-08 MED FILL — HYDROCHLOROTHIAZIDE 25 MG T: 25 | 30 days supply | Qty: 30 | Fill #1

## 2018-09-08 MED FILL — ACCU-CHEK GUIDE TEST STRIP: 30 days supply | Qty: 100 | Fill #4

## 2018-09-08 MED FILL — predniSONE 5 MG TABS: 5 | 30 days supply | Qty: 30 | Fill #1

## 2018-09-08 MED FILL — AMLODIPINE BESYLATE 5 MG TA: 5 | 30 days supply | Qty: 30 | Fill #1

## 2018-09-10 DIAGNOSIS — I639 Cerebral infarction, unspecified: Secondary | ICD-10-CM

## 2018-09-10 HISTORY — DX: Cerebral infarction, unspecified: I63.9

## 2018-09-12 NOTE — Telephone Encounter (Signed)
Patient is scheduled for  Aug 5 th

## 2018-10-18 MED FILL — metFORMIN HCL 500 MG TABS: 500 | 30 days supply | Qty: 30 | Fill #0

## 2018-10-18 MED FILL — OMEPRAZOLE 20 MG CAP: 20 | 30 days supply | Qty: 30 | Fill #1

## 2018-10-18 MED FILL — predniSONE 5 MG TABS: 5 | 30 days supply | Qty: 30 | Fill #2

## 2018-10-18 MED FILL — ACCU-CHEK GUIDE TEST STRIP: 30 days supply | Qty: 100 | Fill #5

## 2018-10-18 MED FILL — HYDROCHLOROTHIAZIDE 25 MG T: 25 | 30 days supply | Qty: 30 | Fill #2

## 2018-10-18 MED FILL — AMLODIPINE BESYLATE 5 MG TA: 5 | 30 days supply | Qty: 30 | Fill #2

## 2018-10-18 MED FILL — ATORVASTATIN 20 MG TABLET: 20 | 30 days supply | Qty: 30 | Fill #1

## 2018-11-25 ENCOUNTER — Ambulatory Visit: Payer: Medicaid Other | Admitting: Physical Therapy

## 2018-11-25 MED FILL — ACCU-CHEK GUIDE TEST STRIP: 30 days supply | Qty: 100 | Fill #6

## 2018-11-25 MED FILL — ACCU-CHEK FASTCLIX LANCETS: 30 days supply | Qty: 102 | Fill #2

## 2018-12-02 MED FILL — HYDROCHLOROTHIAZIDE 25 MG T: 25 | 30 days supply | Qty: 30 | Fill #3

## 2018-12-02 MED FILL — AMLODIPINE BESYLATE 5 MG TA: 5 | 30 days supply | Qty: 30 | Fill #3

## 2018-12-02 MED FILL — ATORVASTATIN CALCIUM 20 MG: 20 | 30 days supply | Qty: 30 | Fill #2

## 2018-12-02 MED FILL — OMEPRAZOLE 20 MG CAPSULE DR: 20 | 30 days supply | Qty: 30 | Fill #2

## 2018-12-02 MED FILL — predniSONE 5 MG TABS: 5 | 30 days supply | Qty: 30 | Fill #3

## 2018-12-02 MED FILL — GABAPENTIN 300 MG CAPSULE: 300 | 90 days supply | Qty: 540 | Fill #2

## 2018-12-08 ENCOUNTER — Telehealth: Payer: Self-pay | Admitting: Family Medicine

## 2018-12-08 NOTE — Telephone Encounter (Signed)
Patient came in wanting to know if he could speak to someone prior to his appointment. Please follow up.

## 2018-12-08 NOTE — Telephone Encounter (Signed)
LMOM

## 2018-12-09 ENCOUNTER — Ambulatory Visit: Payer: Medicaid Other | Admitting: Family Medicine

## 2018-12-12 ENCOUNTER — Other Ambulatory Visit: Payer: Self-pay

## 2018-12-12 ENCOUNTER — Ambulatory Visit (HOSPITAL_COMMUNITY)
Admission: EM | Admit: 2018-12-12 | Discharge: 2018-12-12 | Disposition: A | Discharge status: Home / Self Care | Payer: Medicaid Other | Attending: Family Medicine | Admitting: Family Medicine

## 2018-12-12 ENCOUNTER — Encounter (HOSPITAL_COMMUNITY): Payer: Self-pay

## 2018-12-12 ENCOUNTER — Telehealth (HOSPITAL_COMMUNITY): Payer: Self-pay | Admitting: Family Medicine

## 2018-12-12 DIAGNOSIS — G959 Disease of spinal cord, unspecified: Secondary | ICD-10-CM

## 2018-12-12 DIAGNOSIS — M62830 Muscle spasm of back: Secondary | ICD-10-CM

## 2018-12-12 LAB — POCT URINALYSIS DIP (DEVICE)
Bilirubin Urine: NEGATIVE
Glucose, UA: NEGATIVE mg/dL
Hgb urine dipstick: NEGATIVE
Ketones, ur: NEGATIVE mg/dL
Leukocytes,Ua: NEGATIVE
Nitrite: NEGATIVE
Protein, ur: NEGATIVE mg/dL
Specific Gravity, Urine: 1.015 (ref 1.005–1.030)
Urobilinogen, UA: 2 mg/dL — ABNORMAL HIGH (ref 0.0–1.0)
pH: 7 (ref 5.0–8.0)

## 2018-12-12 MED ORDER — TIZANIDINE HCL 4 MG PO TABS: 4.0000 mg | ORAL_TABLET | Freq: Four times a day (QID) | ORAL | 0 refills | Status: DC | PRN

## 2018-12-12 MED ORDER — HYDROCODONE-ACETAMINOPHEN 5-325 MG PO TABS: 1.0000 | ORAL_TABLET | Freq: Four times a day (QID) | ORAL | 0 refills | Status: DC | PRN

## 2018-12-12 MED ORDER — IBUPROFEN 800 MG PO TABS: 800.0000 mg | ORAL_TABLET | Freq: Three times a day (TID) | ORAL | 0 refills | Status: DC

## 2018-12-12 MED FILL — IBUPROFEN 800 MG TABLET: 800 | 7 days supply | Qty: 21 | Fill #0

## 2018-12-12 MED FILL — tiZANidine HCL 4 MG TABS: 4 | 3 days supply | Qty: 21 | Fill #0

## 2018-12-12 NOTE — ED Triage Notes (Signed)
Patient presents to Urgent Care with complaints of bilateral flank pain since 2-3 days ago. Patient reports he is also concerned with back pain, was supposed to have back surgery but declined due to unknown outcome.

## 2018-12-12 NOTE — ED Provider Notes (Signed)
[place]    CSN: [id number] Arrival date & time: 12/12/18  1119      History   Chief Complaint Chief Complaint  Patient presents with  . Flank Pain    Bilateral    HPI Tony Ortega is a 58 y.o. male.   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  He is afraid to do . Managed with ibuprofen, gabapentin, elavil at night.  Says he hurts "every day", especially In L leg Past Medical History:  Diagnosis Date  . Diabetes mellitus without complication ([place])   . Hypertension   . Neuropathy   . Rheumatoid arthritis [facility])     Patient Active Problem List   Diagnosis Date Noted  . Cervical myelopathy ([place]) 09/07/2018  . Hyperlipidemia associated with type 2 diabetes mellitus ([place]) 06/22/2018  . Long term current use of aspirin 06/22/2018  . Gastroesophageal reflux disease 06/22/2018  . History of CVA (cerebrovascular accident) 06/22/2018  . History of Helicobacter pylori infection 06/22/2018  . Paresthesia 02/24/2018  . Gait abnormality 02/24/2018  . Weakness 02/24/2018  . Tobacco use disorder 02/15/2018  . Community acquired pneumonia 02/15/2018  . Essential hypertension 02/15/2018  . Type 2 diabetes, controlled, with peripheral neuropathy ([place]) 02/15/2018  . Rheumatoid arthritis ([place]) 02/15/2018    Past Surgical History:  Procedure Laterality Date  . KNEE SURGERY Right        Home Medications    Prior to Admission medications   Medication Sig Start Date End Date Taking? Authorizing Provider  ACCU-CHEK FASTCLIX LANCETS MISC Use as directed twice daily to test blood sugar. 03/21/18   Fulp, Cammie, MD  amitriptyline (ELAVIL) 25 MG tablet Take 1 tablet (25 mg total) by mouth at bedtime. 03/21/18   Fulp, Cammie, MD  amLODipine (NORVASC)  5 MG tablet Take 1 tablet (5 mg total) by mouth daily. To lower blood pressure 06/22/18   Fulp, Cammie, MD  aspirin 325 MG tablet Take 325 mg by mouth daily.    [provider]  atorvastatin (LIPITOR) 20 MG tablet Take 1 tablet (20 mg total) by mouth daily. 06/22/18   Fulp, Cammie, MD  Blood Glucose Monitoring Suppl (ACCU-CHEK GUIDE) w/Device KIT 1 each by Does not apply route 2 (two) times daily. Use as directed twice daily to test blood sugar. 03/21/18   Fulp, Cammie, MD  gabapentin (NEURONTIN) 300 MG capsule Take 2 capsules (600 mg total) by mouth 3 (three) times daily. 03/21/18   Fulp, Cammie, MD  glucose blood (ACCU-CHEK GUIDE) test strip Use as instructed to check blood sugars up to twice per day 03/21/18   Fulp, Cammie, MD  hydrochlorothiazide (HYDRODIURIL) 25 MG tablet Take 1 tablet (25 mg total) by mouth daily. 06/22/18   Fulp, Cammie, MD  HYDROcodone-acetaminophen (NORCO/VICODIN) 5-325 MG tablet Take 1-2 tablets by mouth every 6 (six) hours as needed. 12/12/18   Raylene Everts, MD  ibuprofen (ADVIL) 800 MG tablet Take 1 tablet (800 mg total) by mouth 3 (three) times daily. 12/12/18   Raylene Everts, MD  metFORMIN (GLUCOPHAGE) 500 MG tablet Take 1 tablet (500 mg total) by mouth daily with breakfast. 06/22/18   Fulp, Cammie, MD  omeprazole (PRILOSEC) 20 MG capsule Take 1 capsule (20 mg total) by mouth daily. To reduce stomach acid  06/22/18   Fulp, Cammie, MD  tiZANidine (ZANAFLEX) 4 MG tablet Take 1-2 tablets (4-8 mg total) by mouth every 6 (six) hours as needed for muscle spasms. 12/12/18   Raylene Everts, MD    Family History Family History  Problem Relation Age of Onset  . Diabetes Mother   . Hypertension Mother   . Rheum arthritis Mother   . Other Father        unsure - thinks he may have history of cancer  . CAD Neg Hx     Social History Social History   Tobacco Use  . Smoking status: Current Every Day Smoker    Packs/day: 0.10    Types: Cigarettes  . Smokeless  tobacco: Never Used  Substance Use Topics  . Alcohol use: Yes    Alcohol/week: 10.0 standard drinks    Types: 10 Cans of beer per week  . Drug use: Never     Allergies   Pork-derived products   Review of Systems Review of Systems  Constitutional: Negative for chills and fever.  HENT: Negative for ear pain and sore throat.   Eyes: Negative for pain and visual disturbance.  Respiratory: Negative for cough and shortness of breath.   Cardiovascular: Negative for chest pain and palpitations.  Gastrointestinal: Negative for abdominal pain and vomiting.  Genitourinary: Negative for dysuria and hematuria.  Musculoskeletal: Positive for back pain, gait problem, neck pain and neck stiffness. Negative for arthralgias.  Skin: Negative for color change and rash.  Neurological: Positive for weakness and numbness. Negative for seizures and syncope.  All other systems reviewed and are negative.    Physical Exam Triage Vital Signs ED Triage Vitals  Enc Vitals Group     BP 12/12/18 1137 (!) 165/92     Pulse Rate 12/12/18 1137 74     Resp 12/12/18 1137 16     Temp 12/12/18 1137 98.1 F (36.7 C)     Temp Source 12/12/18 1137 Oral     SpO2 12/12/18 1137 98 %     Weight --      Height --      Head Circumference --      Peak Flow --      Pain Score 12/12/18 1135 7     Pain Loc --      Pain Edu? --      Excl. in [place]? --    No data found.  Updated Vital Signs BP (!) 165/92 (BP Location: Left Arm) Comment: has not yet taken bp medicine today  Pulse 74   Temp 98.1 F (36.7 C) (Oral)   Resp 16   SpO2 98%        Physical Exam Constitutional:      General: He is not in acute distress.    Appearance: He is well-developed and normal weight.     Comments: Uses cane.  Guarded movements.  Antalgic gait  HENT:     Head: Normocephalic and atraumatic.  Eyes:     Conjunctiva/sclera: Conjunctivae normal.     Pupils: Pupils are equal, round, and reactive to light.  Neck:      Musculoskeletal: Normal range of motion.  Cardiovascular:     Rate and Rhythm: Normal rate.  Pulmonary:     Effort: Pulmonary effort is normal. No respiratory distress.  Abdominal:     General: There is no distension.     Palpations: Abdomen is soft.  Musculoskeletal: Normal range of motion.     Comments: Tenderness and  palpable spasm in the left lumbar column of muscles.  Right is normal.  No CVA tenderness.  Patient complains of pain with range of motion of left leg.  Reflexes of left leg.  Straight leg raise.  Right leg is unremarkable.  States that he cannot stand for examination, states he cannot sit on exam table  Skin:    General: Skin is warm and dry.  Neurological:     Mental Status: He is alert.      UC Treatments / Results  Labs (all labs ordered are listed, but only abnormal results are displayed) Labs Reviewed  POCT URINALYSIS DIP (DEVICE) - Abnormal; Notable for the following components:      Result Value   Urobilinogen, UA 2.0 (*)    All other components within normal limits    EKG   Radiology No results found.  Procedures Procedures (including critical care time)  Medications Ordered in UC Medications - No data to display  Initial Impression / Assessment and Plan / UC Course  I have reviewed the triage vital signs and the nursing notes.  Pertinent labs & imaging results that were available during my care of the patient were reviewed by me and considered in my medical decision making (see chart for details).     Muscular back pain. Final Clinical Impressions(s) / UC Diagnoses   Final diagnoses:  Muscle spasm of back     Discharge Instructions     We discussed that the degenerative changes in your neck and back are likely to worsen over time  Take the ibuprofen 3 x a day wtih food This is for moderate pain Take the tizanidine as needed for muscle spasm Take the hydrocodone for severe pain.  This will not be refilled.  Do not drive on the  hydrocodone Follow up with your PCP   ED Prescriptions    Medication Sig Dispense Auth. Provider   ibuprofen (ADVIL) 800 MG tablet Take 1 tablet (800 mg total) by mouth 3 (three) times daily. 21 tablet Raylene Everts, MD   tiZANidine (ZANAFLEX) 4 MG tablet Take 1-2 tablets (4-8 mg total) by mouth every 6 (six) hours as needed for muscle spasms. 21 tablet Raylene Everts, MD   HYDROcodone-acetaminophen (NORCO/VICODIN) 5-325 MG tablet Take 1-2 tablets by mouth every 6 (six) hours as needed. 10 tablet Raylene Everts, MD     I have reviewed the PDMP during this encounter.   Raylene Everts, MD 12/12/18 2021

## 2018-12-12 NOTE — Telephone Encounter (Signed)
New pharmacy desired.  Sent to [address]

## 2018-12-12 NOTE — ED Notes (Signed)
Pt returned the call, pt asked for the prescription to be sent to [facility] on [address], [place]. Dr. Meda Coffee was told about the change.

## 2018-12-12 NOTE — Discharge Instructions (Signed)
We discussed that the degenerative changes in your neck and back are likely to worsen over time  Take the ibuprofen 3 x a day wtih food This is for moderate pain Take the tizanidine as needed for muscle spasm Take the hydrocodone for severe pain.  This will not be refilled.  Do not drive on the hydrocodone Follow up with your PCP

## 2018-12-12 NOTE — ED Notes (Signed)
Pharmacy called they are not able to fill the prescription for  HYDROcodone-acetaminophen (NORCO/VICODIN) 5-325 MG tablet, they tried to contacted the patient and they were not able to.   I called the patient to the 2 numbers on chart and I was not able to contact the patient to ask which pharmacy he prefers and send the prescription.

## 2018-12-13 ENCOUNTER — Ambulatory Visit: Payer: Medicaid Other | Attending: Critical Care Medicine | Admitting: Critical Care Medicine

## 2018-12-13 ENCOUNTER — Encounter: Payer: Self-pay | Admitting: Critical Care Medicine

## 2018-12-13 VITALS — BP 154/93 | HR 79 | Temp 98.4°F | Ht 66.0 in | Wt 147.4 lb

## 2018-12-13 DIAGNOSIS — R269 Unspecified abnormalities of gait and mobility: Secondary | ICD-10-CM

## 2018-12-13 DIAGNOSIS — M549 Dorsalgia, unspecified: Secondary | ICD-10-CM | POA: Diagnosis not present

## 2018-12-13 DIAGNOSIS — R260 Ataxic gait: Secondary | ICD-10-CM | POA: Diagnosis not present

## 2018-12-13 DIAGNOSIS — E1169 Type 2 diabetes mellitus with other specified complication: Secondary | ICD-10-CM

## 2018-12-13 DIAGNOSIS — F17210 Nicotine dependence, cigarettes, uncomplicated: Secondary | ICD-10-CM | POA: Insufficient documentation

## 2018-12-13 DIAGNOSIS — M48061 Spinal stenosis, lumbar region without neurogenic claudication: Secondary | ICD-10-CM | POA: Diagnosis not present

## 2018-12-13 DIAGNOSIS — Z79899 Other long term (current) drug therapy: Secondary | ICD-10-CM | POA: Insufficient documentation

## 2018-12-13 DIAGNOSIS — M542 Cervicalgia: Secondary | ICD-10-CM | POA: Insufficient documentation

## 2018-12-13 DIAGNOSIS — I10 Essential (primary) hypertension: Secondary | ICD-10-CM | POA: Diagnosis not present

## 2018-12-13 DIAGNOSIS — Z7982 Long term (current) use of aspirin: Secondary | ICD-10-CM | POA: Insufficient documentation

## 2018-12-13 DIAGNOSIS — M069 Rheumatoid arthritis, unspecified: Secondary | ICD-10-CM | POA: Diagnosis not present

## 2018-12-13 DIAGNOSIS — I6782 Cerebral ischemia: Secondary | ICD-10-CM | POA: Insufficient documentation

## 2018-12-13 DIAGNOSIS — G959 Disease of spinal cord, unspecified: Secondary | ICD-10-CM | POA: Insufficient documentation

## 2018-12-13 DIAGNOSIS — R202 Paresthesia of skin: Secondary | ICD-10-CM

## 2018-12-13 DIAGNOSIS — E785 Hyperlipidemia, unspecified: Secondary | ICD-10-CM | POA: Insufficient documentation

## 2018-12-13 DIAGNOSIS — F17200 Nicotine dependence, unspecified, uncomplicated: Secondary | ICD-10-CM | POA: Diagnosis not present

## 2018-12-13 DIAGNOSIS — M4712 Other spondylosis with myelopathy, cervical region: Secondary | ICD-10-CM | POA: Insufficient documentation

## 2018-12-13 DIAGNOSIS — Z9181 History of falling: Secondary | ICD-10-CM | POA: Insufficient documentation

## 2018-12-13 DIAGNOSIS — Z7984 Long term (current) use of oral hypoglycemic drugs: Secondary | ICD-10-CM | POA: Diagnosis not present

## 2018-12-13 DIAGNOSIS — E1142 Type 2 diabetes mellitus with diabetic polyneuropathy: Secondary | ICD-10-CM | POA: Diagnosis not present

## 2018-12-13 DIAGNOSIS — M5416 Radiculopathy, lumbar region: Secondary | ICD-10-CM | POA: Insufficient documentation

## 2018-12-13 DIAGNOSIS — M4726 Other spondylosis with radiculopathy, lumbar region: Secondary | ICD-10-CM | POA: Insufficient documentation

## 2018-12-13 LAB — POCT GLYCOSYLATED HEMOGLOBIN (HGB A1C): Hemoglobin A1C: 6.8 % — AB (ref 4.0–5.6)

## 2018-12-13 LAB — GLUCOSE, POCT (MANUAL RESULT ENTRY): POC Glucose: 220 mg/dl — AB (ref 70–99)

## 2018-12-13 MED ORDER — HYDROCODONE-ACETAMINOPHEN 5-325 MG PO TABS: 1.0000 | ORAL_TABLET | Freq: Four times a day (QID) | ORAL | 0 refills | Status: DC | PRN

## 2018-12-13 NOTE — Progress Notes (Signed)
Subjective:    Patient ID: Tony Ortega, male    DOB: September 12, 1960, 58 y.o.   MRN: [id number]  This is a 59 year old male who is seen today as a work in for neck and lower back pain along with increased difficulty with ambulation.  Patient has a history of diabetes type 2 with neuropathy rheumatoid arthritis hypertension in the past.  Patient's primary care provider is Dr. 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 Patient just went to urgent care for leg and back pain 1 day ago documentation is as noted below I HPI Pain in the left flank for 2-3 days No hematuria or dysuria No history to kidney stone or disease No trauma Long history of cervical and lumbar DJD with radiculopathy/myelopathy that was worked up and found to be spinal cord impingement,  Surgery recommended.  He is afraid to do . Managed with ibuprofen, gabapentin, elavil at night.  Says he hurts "every day", especially In L leg  Take the ibuprofen 3 x a day wtih food This is for moderate pain Take the tizanidine as needed for muscle spasm Take the hydrocodone for severe pain.  This will not be refilled.  Do not drive on the hydrocodone Follow up with your PCP  ED Prescriptions   Medication Sig Dispense Auth. Provider  ibuprofen (ADVIL) 800 MG tablet Take 1 tablet (800 mg total) by mouth 3 (three) times daily. 21 tablet Raylene Everts, MD  tiZANidine (ZANAFLEX) 4 MG tablet Take 1-2 tablets (4-8 mg total) by mouth every 6 (six) hours as needed for muscle spasms. 21 tablet Raylene Everts, MD  HYDROcodone-acetaminophen (NORCO/VICODIN) 5-325 MG tablet Take 1-2 tablets by mouth every 6 (six) hours as needed. 10 tablet Raylene Everts, MD  The patient is worked in today because he is concerned he may have a kidney stone.  Upon further review the patient notes that he has increased gait ataxia and significant pain in the neck and in the lower back.  MRIs have previously been done of the neck showing advanced cervical stenosis with spinal cord  impingement and he has been already seen by neurosurgery for this.  He also has nerve root compression in the lumbar area as well.  The patient states his blood glucoses have been in the 1 20-200 range.  He does take the Metformin on a daily basis.  Note hemoglobin A1c was 6.8 today along with a blood sugar of 220  Note the patient also has had frequent falls.   Past Medical History:  Diagnosis Date  . Diabetes mellitus without complication ([place])   . Hypertension   . Neuropathy   . Rheumatoid arthritis ([place])      Family History  Problem Relation Age of Onset  . Diabetes Mother   . Hypertension Mother   . Rheum arthritis Mother   . Other Father        unsure - thinks he may have history of cancer  . CAD Neg Hx      Social History   Socioeconomic History  . Marital status: Married    Spouse name: Not on file  . Number of children: 2  . Years of education: 46  . Highest education level: Not on file  Occupational History  . Occupation: Disabled  Social Needs  . Financial resource strain: Not on file  . Food insecurity    Worry: Not on file    Inability: Not on file  . Transportation needs    Medical: Not on file  Non-medical: Not on file  Tobacco Use  . Smoking status: Current Every Day Smoker    Packs/day: 0.10    Types: Cigarettes  . Smokeless tobacco: Never Used  Substance and Sexual Activity  . Alcohol use: Yes    Alcohol/week: 10.0 standard drinks    Types: 10 Cans of beer per week  . Drug use: Never  . Sexual activity: Not on file  Lifestyle  . Physical activity    Days per week: Not on file    Minutes per session: Not on file  . Stress: Not on file  Relationships  . Social Herbalist on phone: Not on file    Gets together: Not on file    Attends religious service: Not on file    Active member of club or organization: Not on file    Attends meetings of clubs or organizations: Not on file    Relationship status: Not on file  . Intimate  partner violence    Fear of current or ex partner: Not on file    Emotionally abused: Not on file    Physically abused: Not on file    Forced sexual activity: Not on file  Other Topics Concern  . Not on file  Social History Narrative   Lives at home with mother.   Left-handed.   He released was released from prison in March 2019.  He served 32 years.   2 cups caffeine per day.        Allergies  Allergen Reactions  . Pork-Derived Products Other (See Comments)    No pork products     Outpatient Medications Prior to Visit  Medication Sig Dispense Refill  . ACCU-CHEK FASTCLIX LANCETS MISC Use as directed twice daily to test blood sugar. 102 each 12  . amitriptyline (ELAVIL) 25 MG tablet Take 1 tablet (25 mg total) by mouth at bedtime. 30 tablet 6  . amLODipine (NORVASC) 5 MG tablet Take 1 tablet (5 mg total) by mouth daily. To lower blood pressure 30 tablet 6  . aspirin 325 MG tablet Take 325 mg by mouth daily.    Marland Kitchen atorvastatin (LIPITOR) 20 MG tablet Take 1 tablet (20 mg total) by mouth daily. 90 tablet 1  . Blood Glucose Monitoring Suppl (ACCU-CHEK GUIDE) w/Device KIT 1 each by Does not apply route 2 (two) times daily. Use as directed twice daily to test blood sugar. 1 kit 0  . gabapentin (NEURONTIN) 300 MG capsule Take 2 capsules (600 mg total) by mouth 3 (three) times daily. 180 capsule 11  . glucose blood (ACCU-CHEK GUIDE) test strip Use as instructed to check blood sugars up to twice per day 100 each 12  . hydrochlorothiazide (HYDRODIURIL) 25 MG tablet Take 1 tablet (25 mg total) by mouth daily. 90 tablet 1  . ibuprofen (ADVIL) 800 MG tablet Take 1 tablet (800 mg total) by mouth 3 (three) times daily. 21 tablet 0  . metFORMIN (GLUCOPHAGE) 500 MG tablet Take 1 tablet (500 mg total) by mouth daily with breakfast. 90 tablet 1  . omeprazole (PRILOSEC) 20 MG capsule Take 1 capsule (20 mg total) by mouth daily. To reduce stomach acid 90 capsule 4  . tiZANidine (ZANAFLEX) 4 MG tablet  Take 1-2 tablets (4-8 mg total) by mouth every 6 (six) hours as needed for muscle spasms. 21 tablet 0  . HYDROcodone-acetaminophen (NORCO/VICODIN) 5-325 MG tablet Take 1-2 tablets by mouth every 6 (six) hours as needed. 10 tablet 0  . HYDROcodone-acetaminophen (  NORCO/VICODIN) 5-325 MG tablet Take 1-2 tablets by mouth every 6 (six) hours as needed. (Patient not taking: Reported on 12/13/2018) 10 tablet 0   No facility-administered medications prior to visit.      Review of Systems  Constitutional: Negative.   HENT: Negative.   Respiratory: Negative.   Cardiovascular: Negative.   Endocrine: Negative.   Genitourinary: Positive for flank pain.  Musculoskeletal: Positive for gait problem, neck pain and neck stiffness.  Neurological: Positive for weakness and numbness.  Hematological: Negative.   Psychiatric/Behavioral: Negative.        Objective:   Physical Exam  Vitals:   12/13/18 0916  BP: (!) 154/93  Pulse: 79  Temp: 98.4 F (36.9 C)  TempSrc: Oral  SpO2: 98%  Weight: 147 lb 6.4 oz (66.9 kg)  Height: _0  (1.676 m)    Gen: Pleasant, well-nourished, in no distress,  normal affect  ENT: No lesions,  mouth clear,  oropharynx clear, no postnasal drip  Neck: No JVD, no TMG, no carotid bruits  Lungs: No use of accessory muscles, no dullness to percussion, clear without rales or rhonchi  Cardiovascular: RRR, heart sounds normal, no murmur or gallops, no peripheral edema  Abdomen: soft and NT, no HSM,  BS normal  Musculoskeletal: No deformities, no cyanosis or clubbing Tenderness in the lumbar area and also in the neck area  Neuro: alert, significant motor weakness in both lower extremities and significant gait ataxia.  The patient is using a cane and nearly falls in the exam room getting up from a chair doing to get up and go test  Skin: Warm, no lesions or rashes  12/13/2018 CBG 220  A1C:  6.8   09/06/18 MRI back FINDINGS:  The lumbar vertebrae demonstrate slight  loss of forward lordotic curvature but normal body height and marrow signal characteristics.  Intervertebral discs show normal appearance except loss of disc height at L5-S1 with focal central protrusion.  L1-L2 shows only minor facet degenerative change without significant compression.  L2- 3 shows prominent ligamentum flavum as well as facet hypertrophy resulting in mild posterior canal narrowing but no definite compression.  L3-4 shows prominent facet hypertrophy bilaterally resulting in mild foraminal narrowing but no definite compression.  L4-5 also shows prominent facet hypertrophy but resulting in mild left greater than right foraminal narrowing.  L5-S1 shows loss of disc height and diffuse disc bulge and prominent facet hypertrophy resulting in mild left-sided foraminal narrowing.  Conus medullaris terminates at L1.  Visualized portion of lower thoracic spine also show mild disc degenerative changes.  Paraspinal soft tissue show no abnormalities.     IMPRESSION: Abnormal MRI scan of the lumbar spine showing mild facet degenerative changes described above with only mild left greater than right foraminal narrowing at L4-5 and on the left at L5-S1 but no significant compression.  MRI Brain: IMPRESSION: Unremarkable MRI scan of the brain showing mild age-appropriate changes of chronic microvascular ischemia and paranasal sinusitis.   MRI Neck:  IMPRESSION: Abnormal MRI scan cervical spine showing prominent spondylitic disc degenerative changes at C 4-5 and C6-7 resulting in severe cord compression and cord signal abnormality.    BMP Latest Ref Rng & Units 04/22/2018 02/02/2018 01/10/2018  Glucose 65 - 99 mg/dL 112(H) 101(H) 138(H)  BUN 8 - 27 mg/dL _1 Creatinine 0.76 - 1.27 mg/dL 0.94 0.92 0.86  BUN/Creat Ratio 10 - 24 11 - 10  Sodium 134 - 144 mmol/L 143 138 138  Potassium 3.5 - 5.2 mmol/L  5.0 3.3(L) 4.3  Chloride 96 - 106 mmol/L 102 99 101  CO2 20 - 29 mmol/L _0 Calcium 8.6 - 10.2 mg/dL 9.9 9.2 9.8   CBC Latest Ref Rng & Units 02/02/2018 12/24/2017 12/06/2017  WBC 4.0 - 10.5 K/uL 8.6 5.8 6.8  Hemoglobin 13.0 - 17.0 g/dL 17.0 16.4 15.0  Hematocrit 39.0 - 52.0 % 52.3(H) 50.6 43.3  Platelets 150 - 400 K/uL 247 229 219       Assessment & Plan:  I personally reviewed all images and lab data in the [facility] system as well as any outside material available during this office visit and agree with the  radiology impressions.   Cervical myelopathy (HCC) Significant cervical myelopathy with significant spinal cord early compression seen on MRI of the spine from previous evaluations  I have encouraged this patient to proceed to continue Neurontin, use Zanaflex as needed and ibuprofen as prescribed.  I also encouraged the patient to follow-up with neurosurgery as soon as possible  At high risk for injury related to fall The patient demonstrates a high risk for falls and is marked is a high fall risk  Gait abnormality Significant gait abnormality and gait ataxia due to cervical spine disease and lumbar disease  Will obtain a rolling walker for this patient due to high fall risk  Paresthesia Paresthesias in the basis of spinal disease  Type 2 diabetes, controlled, with peripheral neuropathy (HCC) Type 2 diabetes with peripheral neuropathy and hyperlipidemia  Continue metformin as currently dosed  Lumbar radiculopathy, chronic Lumbar MRI shows significant radiculopathy with spinal nerve root compression  Follow-up per neurosurgery   Diagnoses and all orders for this visit:  Cervical myelopathy ([place]) -     For home use only DME 4 wheeled rolling walker with seat ([id number])  Type 2 diabetes, controlled, with peripheral neuropathy (HCC) -     Glucose (CBG) -     HgB A1c  Hyperlipidemia associated with type 2 diabetes mellitus ([place])  Tobacco use disorder  Essential hypertension  Paresthesia  At high risk for injury related to fall -     For  home use only DME 4 wheeled rolling walker with seat ([id number])  Gait abnormality  Lumbar radiculopathy, chronic

## 2018-12-13 NOTE — Patient Instructions (Addendum)
No change in medications  Take the medications prescribed by urgent care for your back pain  I will obtain for you a rolling walker so that you can use this to ambulate with and reduce her fall risk  Please contact neurosurgery as soon as possible to have an appointment and give consideration to your neck surgery to prevent quadriparesis which is paralysis below the neck  I will produce a letter for you for your attorney regarding your medical disability status and we will either mail it to your call you and you can come back and pick it up, it would be available on 4 November  Return to see Dr. Chapman Fitch 6 weeks

## 2018-12-13 NOTE — Telephone Encounter (Signed)
Rx not at [facility]. Printed:  Meds ordered this encounter  Medications  . HYDROcodone-acetaminophen (NORCO/VICODIN) 5-325 MG tablet    Sig: Take 1 tablet by mouth every 6 (six) hours as needed for moderate pain or severe pain.    Dispense:  8 tablet    Refill:  0   [place] Controlled Substances Registry consulted for this patient. I feel the risk/benefit ratio today is favorable for proceeding with this prescription for a controlled substance. Medication sedation precautions given.

## 2018-12-13 NOTE — Assessment & Plan Note (Signed)
Paresthesias in the basis of spinal disease

## 2018-12-13 NOTE — Assessment & Plan Note (Signed)
Significant gait abnormality and gait ataxia due to cervical spine disease and lumbar disease  Will obtain a rolling walker for this patient due to high fall risk

## 2018-12-13 NOTE — Assessment & Plan Note (Signed)
Type 2 diabetes with peripheral neuropathy and hyperlipidemia  Continue metformin as currently dosed

## 2018-12-13 NOTE — Assessment & Plan Note (Signed)
Lumbar MRI shows significant radiculopathy with spinal nerve root compression  Follow-up per neurosurgery

## 2018-12-13 NOTE — Assessment & Plan Note (Signed)
Significant cervical myelopathy with significant spinal cord early compression seen on MRI of the spine from previous evaluations  I have encouraged this patient to proceed to continue Neurontin, use Zanaflex as needed and ibuprofen as prescribed.  I also encouraged the patient to follow-up with neurosurgery as soon as possible

## 2018-12-13 NOTE — Assessment & Plan Note (Signed)
The patient demonstrates a high risk for falls and is marked is a high fall risk

## 2018-12-14 ENCOUNTER — Telehealth: Payer: Self-pay | Admitting: Family Medicine

## 2018-12-14 ENCOUNTER — Ambulatory Visit: Payer: Medicaid Other

## 2018-12-14 DIAGNOSIS — E1142 Type 2 diabetes mellitus with diabetic polyneuropathy: Secondary | ICD-10-CM

## 2018-12-14 NOTE — Telephone Encounter (Signed)
Patient came in to the facility with a complaint. Patient states that two of the front office staff members were being rude and not professional when he came in. Spoke to the patient and told him I would address the situation with the front office staff. Patient wanted to fill out a compliant form and I told the patient that we do not have form for this situations, but that I would address this situation. Patient understood and left.

## 2018-12-26 IMAGING — CT CT ANGIO CHEST-ABD-PELV FOR DISSECTION W/ AND WO/W CM
2 of 7 series · 13 of 46 positions shown, 15 images · IV contrast (iopamidol)
Comparison: Chest x-ray today.

CLINICAL DATA: Anterior chest pain right greater than left
radiating to right arm and back. Elevated D-dimer with negative
lower extremity ultrasound bilaterally.

EXAM:
CT ANGIOGRAPHY CHEST, ABDOMEN AND PELVIS
TECHNIQUE: Multidetector CT imaging through the chest, abdomen and pelvis was
performed using the standard protocol during bolus administration of
intravenous contrast. Multiplanar reconstructed images and MIPs were
obtained and reviewed to evaluate the vascular anatomy.
CONTRAST:  100mL EEGSEX-ETZ IOPAMIDOL (EEGSEX-ETZ) INJECTION 76%

[Series 6: arterial · axial · arterial · 0.63mm/px · z∈[+890,+1468]mm · 10 of 323 slices shown, 12 images]
[im 17/323  soft-tissue]
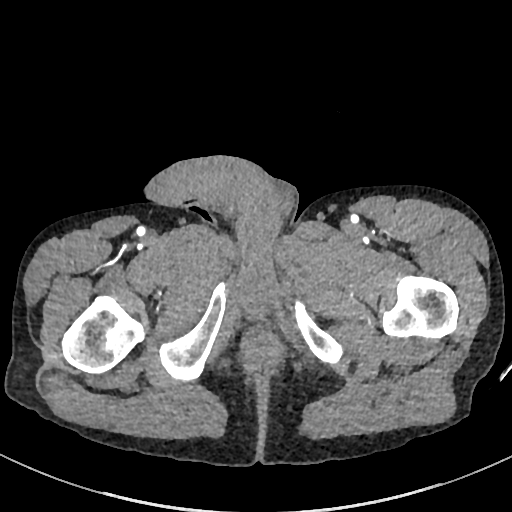
[im 17/323  bone]
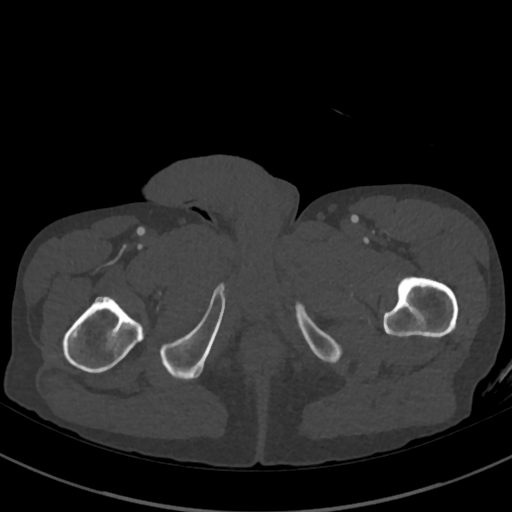
[im 51/323  soft-tissue]
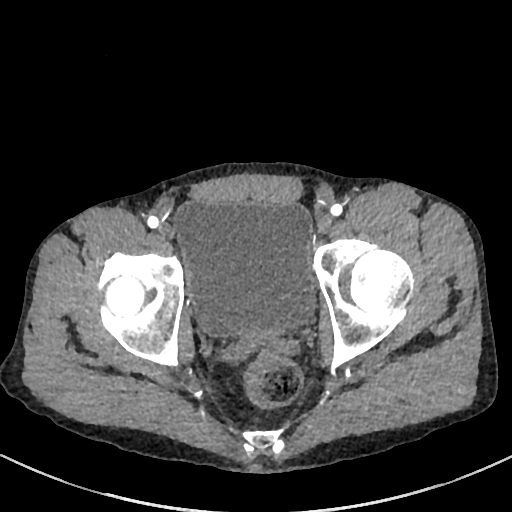
[im 85/323  soft-tissue]
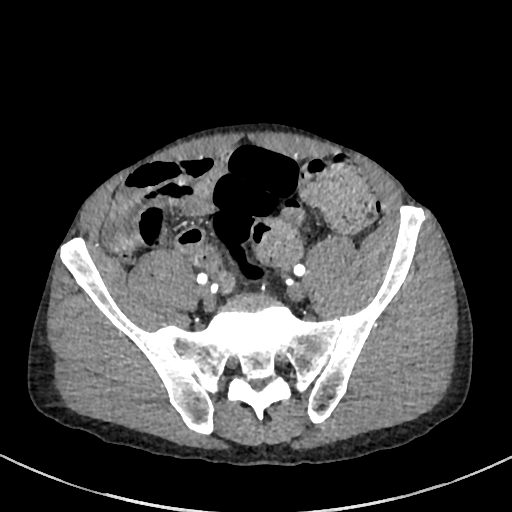
[im 119/323  soft-tissue]
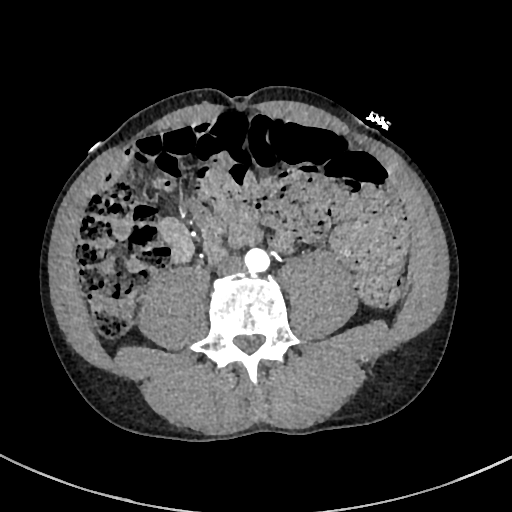
[im 153/323  soft-tissue]
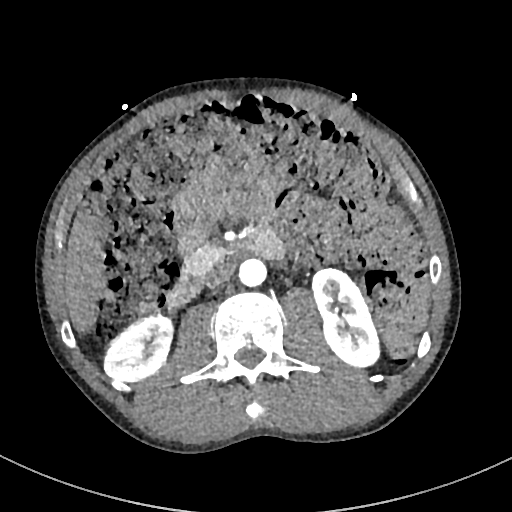
[im 170/323  soft-tissue]
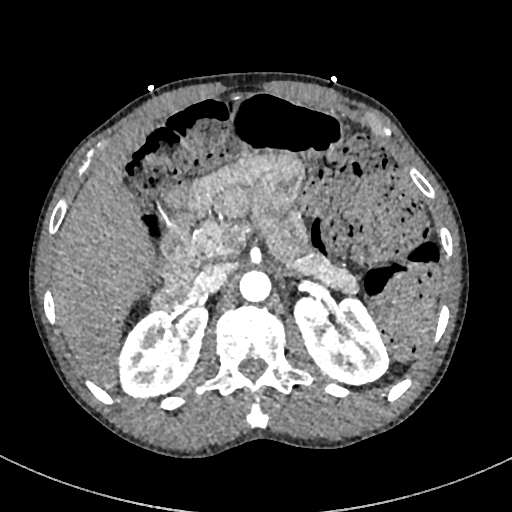
[im 204/323  soft-tissue]
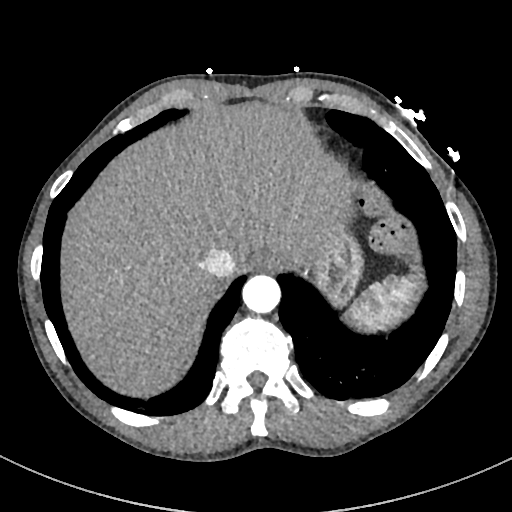
[im 238/323  soft-tissue]
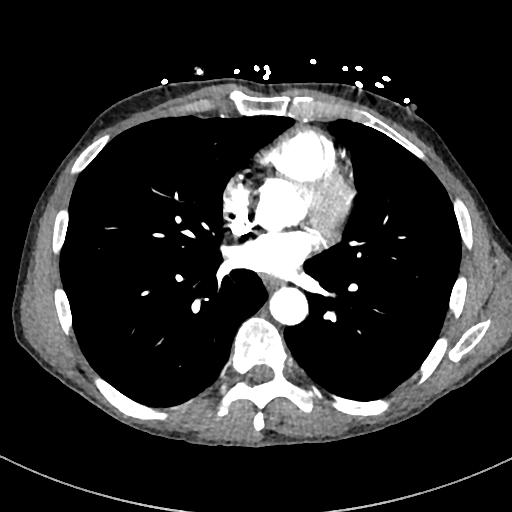
[im 272/323  soft-tissue]
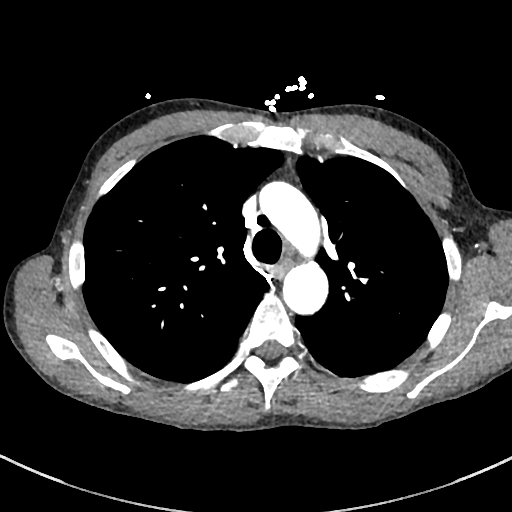
[im 272/323  bone]
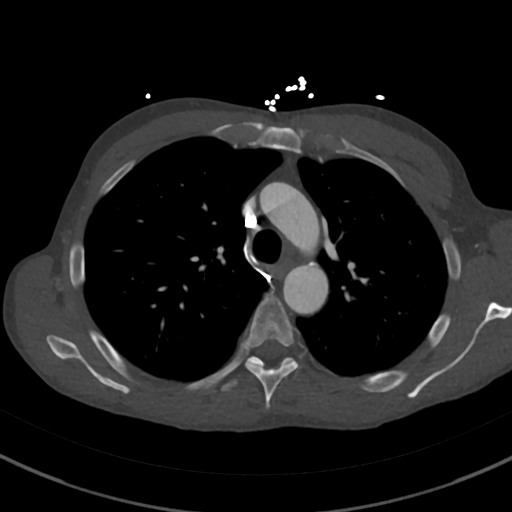
[im 306/323  soft-tissue]
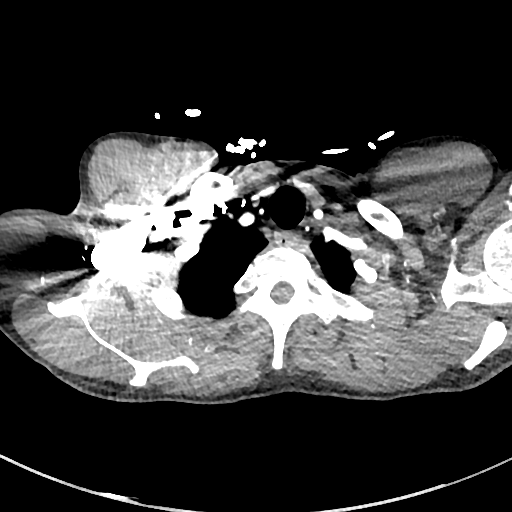

[Series 9: cor · coronal · 0.65mm/px · 3 of 126 slices shown]
[im 32/126  soft-tissue]
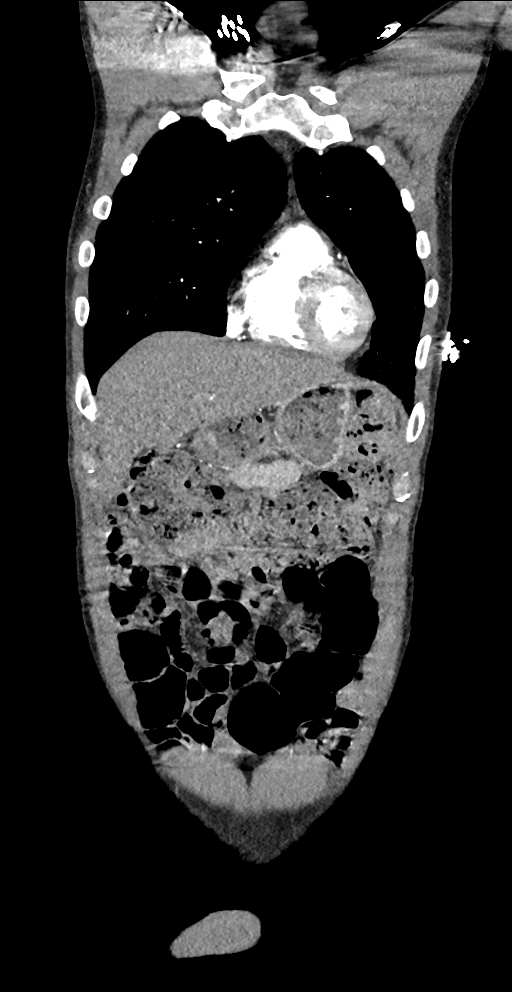
[im 63/126  soft-tissue]
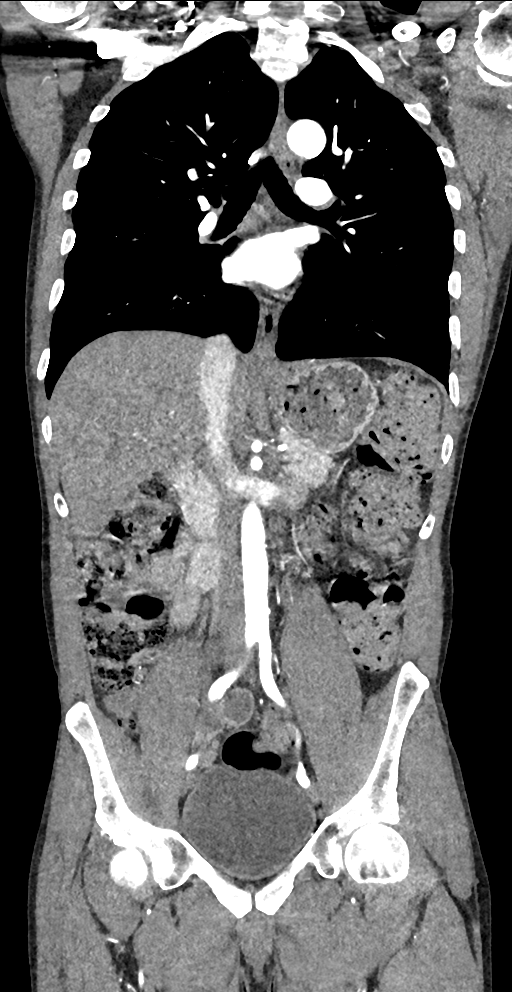
[im 94/126  soft-tissue]
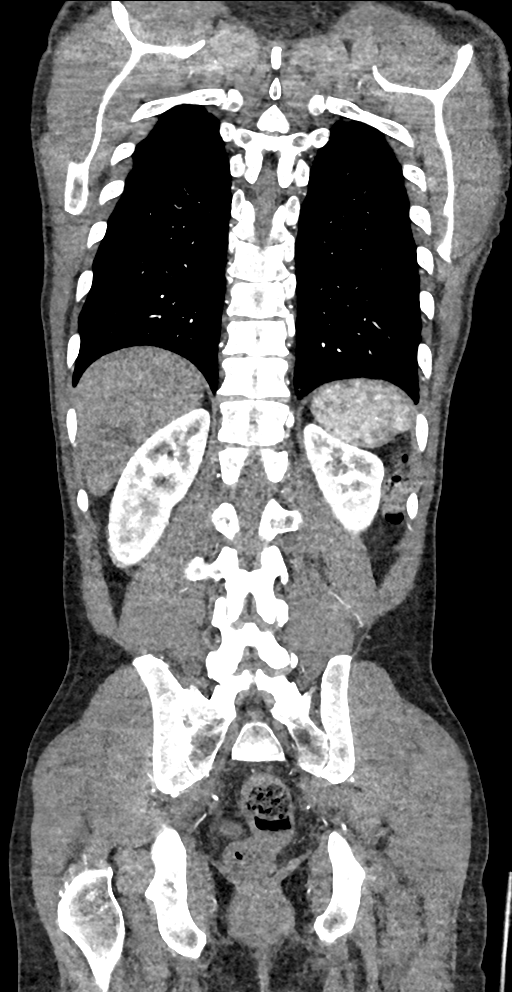

[13 of 46 positions shown; findings below may reference images not displayed]

FINDINGS: CTA CHEST FINDINGS

Cardiovascular: Heart is normal size. Thoracic aorta is normal
caliber without dissection or aneurysm. Pulmonary arterial system is
within normal.

Mediastinum/Nodes: No evidence of mediastinal or hilar adenopathy.
Remaining mediastinal structures are normal.

Lungs/Pleura: Lungs are well inflated without lobar consolidation or
effusion. There is a subpleural 4 mm nodule over the lingula. There
is subtle bilateral patchy nodular opacification right worse than
left. Minimal linear density over the left upper lobe and posterior
left lower lobe. Airways are normal.

Musculoskeletal: Normal.

Review of the MIP images confirms the above findings.

CTA ABDOMEN AND PELVIS FINDINGS

VASCULAR

Aorta: Normal in caliber without evidence of aneurysm or dissection.
Subtle plaque at the bifurcation.

Celiac: Normal.

SMA: Normal.

Renals: Normal.

IMA: Normal.

Inflow: Patent without stenosis or occlusion.

Veins: Unremarkable.

Review of the MIP images confirms the above findings.

NON-VASCULAR

Hepatobiliary: Gallbladder is contracted. Liver and biliary tree are
normal.

Pancreas: Normal.

Spleen: Normal.

Adrenals/Urinary Tract: Adrenal glands are normal. Kidneys are
normal in size without hydronephrosis or nephrolithiasis. Ureters
and bladder are normal.

Stomach/Bowel: Stomach and small bowel are normal. Appendix is
normal. There is moderate stool throughout the colon which is
otherwise within normal.

Lymphatic: No adenopathy.

Reproductive: Normal.

Other: No free fluid or focal inflammatory change.

Musculoskeletal: Normal.

Review of the MIP images confirms the above findings.
IMPRESSION: Unremarkable thoracoabdominal aorta without evidence of aneurysm or
dissection.

Subtle patchy nodular opacification within the lungs right worse
than left which may be due to inflammatory or atypical infectious
process. More discrete subpleural 4 mm nodule over the lingula.
Recommend follow-up CT 4-6 weeks.

No acute findings in the abdomen/pelvis.

Aortic Atherosclerosis (16GCT-GCC.C).

Moderate fecal retention throughout the colon without evidence of
obstruction.

## 2018-12-29 ENCOUNTER — Ambulatory Visit: Payer: Medicaid Other | Admitting: Family Medicine

## 2019-01-02 ENCOUNTER — Other Ambulatory Visit: Payer: Self-pay | Admitting: Critical Care Medicine

## 2019-01-02 ENCOUNTER — Encounter: Payer: Self-pay | Admitting: Critical Care Medicine

## 2019-01-02 MED FILL — predniSONE 5 MG TABS: 5 | 30 days supply | Qty: 30 | Fill #4

## 2019-01-02 MED FILL — AMLODIPINE BESYLATE 5 MG TA: 5 | 30 days supply | Qty: 30 | Fill #4

## 2019-01-02 MED FILL — ACCU-CHEK GUIDE TEST STRIP: 30 days supply | Qty: 100 | Fill #7

## 2019-01-02 MED FILL — HYDROCHLOROTHIAZIDE 25 MG T: 25 | 30 days supply | Qty: 30 | Fill #4

## 2019-01-09 ENCOUNTER — Telehealth: Payer: Self-pay | Admitting: Critical Care Medicine

## 2019-01-09 ENCOUNTER — Telehealth: Payer: Self-pay | Admitting: *Deleted

## 2019-01-09 NOTE — Telephone Encounter (Signed)
Received handwritten letter from patient requesting a letter detailing his pending neurosurgery.  He needs this to provide to his parole officer.  I attempted to reach the patient by phone.  His mother (on [place]) answered the call.  She verbalized understanding that he will need to contact his neurosurgeon for this information.

## 2019-01-09 NOTE — Telephone Encounter (Signed)
The patient called wanting a letter stating his current problems that put him at high risk for COVID-19 infection

## 2019-01-13 ENCOUNTER — Telehealth: Payer: Self-pay | Admitting: Podiatry

## 2019-01-13 NOTE — Telephone Encounter (Signed)
Called and let pt's relative/family member know his medical records are ready to be picked up.

## 2019-01-19 ENCOUNTER — Other Ambulatory Visit: Payer: Self-pay | Admitting: Pharmacist

## 2019-01-19 ENCOUNTER — Telehealth: Payer: Self-pay | Admitting: *Deleted

## 2019-01-19 DIAGNOSIS — E1142 Type 2 diabetes mellitus with diabetic polyneuropathy: Secondary | ICD-10-CM

## 2019-01-19 DIAGNOSIS — I10 Essential (primary) hypertension: Secondary | ICD-10-CM

## 2019-01-19 MED ORDER — ACCU-CHEK GUIDE VI STRP: ORAL_STRIP | 12 refills | Status: DC

## 2019-01-19 MED FILL — ACCU-CHEK GUIDE TEST STRIP: 50 days supply | Qty: 100 | Fill #0

## 2019-01-19 NOTE — Telephone Encounter (Signed)
Ok to refill his test strips

## 2019-01-24 MED FILL — ACCU-CHEK GUIDE TEST STRIP: 30 days supply | Qty: 100 | Fill #8

## 2019-01-30 ENCOUNTER — Telehealth: Payer: Self-pay | Admitting: *Deleted

## 2019-01-30 NOTE — Telephone Encounter (Signed)
I returned the call to his number.  His wife answered the phone.  Stated they are working with the neurosurgery office in getting the surgeries approved.  She will make sure the patient knows that she has been in touch with them.

## 2019-01-30 NOTE — Telephone Encounter (Signed)
Patient stopped by the office to state he feels like his symptoms are getting worse.  Has questions about his referral for surgery.  States his insurance has approved one of the surgeries but not both.  What can he do to get both approved? Please call.

## 2019-02-01 ENCOUNTER — Encounter: Payer: Self-pay | Admitting: Family Medicine

## 2019-02-01 ENCOUNTER — Ambulatory Visit (HOSPITAL_BASED_OUTPATIENT_CLINIC_OR_DEPARTMENT_OTHER): Payer: Medicaid Other | Admitting: Family Medicine

## 2019-02-01 ENCOUNTER — Other Ambulatory Visit: Payer: Self-pay

## 2019-02-01 DIAGNOSIS — M5416 Radiculopathy, lumbar region: Secondary | ICD-10-CM

## 2019-02-01 DIAGNOSIS — G959 Disease of spinal cord, unspecified: Secondary | ICD-10-CM

## 2019-02-01 DIAGNOSIS — I10 Essential (primary) hypertension: Secondary | ICD-10-CM

## 2019-02-01 DIAGNOSIS — E785 Hyperlipidemia, unspecified: Secondary | ICD-10-CM

## 2019-02-01 DIAGNOSIS — Z8673 Personal history of transient ischemic attack (TIA), and cerebral infarction without residual deficits: Secondary | ICD-10-CM

## 2019-02-01 DIAGNOSIS — E1142 Type 2 diabetes mellitus with diabetic polyneuropathy: Secondary | ICD-10-CM

## 2019-02-01 DIAGNOSIS — K219 Gastro-esophageal reflux disease without esophagitis: Secondary | ICD-10-CM

## 2019-02-01 DIAGNOSIS — E1169 Type 2 diabetes mellitus with other specified complication: Secondary | ICD-10-CM

## 2019-02-01 DIAGNOSIS — Z7982 Long term (current) use of aspirin: Secondary | ICD-10-CM

## 2019-02-01 DIAGNOSIS — Z79899 Other long term (current) drug therapy: Secondary | ICD-10-CM | POA: Insufficient documentation

## 2019-02-01 MED ORDER — METFORMIN HCL 500 MG PO TABS: 500.0000 mg | ORAL_TABLET | Freq: Every day | ORAL | 1 refills | Status: DC

## 2019-02-01 MED ORDER — ASPIRIN 325 MG PO TABS: 325.0000 mg | ORAL_TABLET | Freq: Every day | ORAL | 3 refills | Status: DC

## 2019-02-01 MED ORDER — ATORVASTATIN CALCIUM 20 MG PO TABS: 20.0000 mg | ORAL_TABLET | Freq: Every day | ORAL | 1 refills | Status: DC

## 2019-02-01 MED ORDER — TIZANIDINE HCL 4 MG PO TABS: 4.0000 mg | ORAL_TABLET | Freq: Four times a day (QID) | ORAL | 1 refills | Status: DC | PRN

## 2019-02-01 MED ORDER — HYDROCHLOROTHIAZIDE 25 MG PO TABS: 25.0000 mg | ORAL_TABLET | Freq: Every day | ORAL | 1 refills | Status: DC

## 2019-02-01 MED ORDER — OMEPRAZOLE 20 MG PO CPDR: 20.0000 mg | DELAYED_RELEASE_CAPSULE | Freq: Every day | ORAL | 4 refills | Status: DC

## 2019-02-01 MED ORDER — GABAPENTIN 300 MG PO CAPS: 600.0000 mg | ORAL_CAPSULE | Freq: Three times a day (TID) | ORAL | 11 refills | Status: DC

## 2019-02-01 MED ORDER — AMITRIPTYLINE HCL 25 MG PO TABS: 25.0000 mg | ORAL_TABLET | Freq: Every day | ORAL | 6 refills | Status: DC

## 2019-02-01 MED FILL — ATORVASTATIN CALCIUM 20 MG: 20 | 90 days supply | Qty: 90 | Fill #0

## 2019-02-01 MED FILL — HYDROCHLOROTHIAZIDE 25 MG T: 25 | 90 days supply | Qty: 90 | Fill #0

## 2019-02-01 MED FILL — tiZANidine HCL 4 MG TABS: 4 | 7 days supply | Qty: 60 | Fill #0

## 2019-02-01 MED FILL — OMEPRAZOLE 20 MG CAP: 20 | 90 days supply | Qty: 90 | Fill #0

## 2019-02-01 MED FILL — metFORMIN HCL 500 MG TABS: 500 | 90 days supply | Qty: 90 | Fill #0

## 2019-02-01 MED FILL — AMITRIPTYLINE HCL 25 MG TAB: 25 | 90 days supply | Qty: 90 | Fill #0

## 2019-02-01 NOTE — Progress Notes (Signed)
Virtual Visit via Telephone Note  I connected with Tony Ortega on 02/01/19 at  9:10 AM EST by telephone and verified that I am speaking with the correct person using two identifiers.   I discussed the limitations, risks, security and privacy concerns of performing an evaluation and management service by telephone and the availability of in person appointments. I also discussed with the patient that there may be a patient responsible charge related to this service. The patient expressed understanding and agreed to proceed.  Patient Location: Home Provider Location: [facility] Others participating in call: none   History of Present Illness:        58 yo male with diabetes, hypertension, hyperlipidemia who has complaint of continued pain from lumbar radiculopathy/neuropathy as well as cervical myelopathy/radiculopathy.  He reports that he has seen neurosurgery and was supposed to have surgery done however this was postponed and patient has not received notification as to when the surgery will be rescheduled.  In the meantime, patient reports that he had a recent positive urine drug test after use of CBD to help with neuropathic pain.  He has been imprisoned in the past and states that he was recently released after serving 30 years for a crime he did not commit however he has now been told that he may have violated his parole because of the positive drug test which he believes is related to his use of CBD for pain.  He states that he was told by the judge that if he can obtain a letter from his neurosurgeon as to when the surgery will take place and if it is necessary for patient to remain out of jail/prison until surgery takes place then he may not have to return to [place] at this time.  He reports that he has been unable to get any specific answers about the surgery from his neurosurgeon.           He continues to take his other chronic medications.  He reports that his home blood sugars are  generally in the 130s or less fasting.  He denies any significant hypoglycemic episodes.  He continues to have painful burning/sharp pain radiating down his left leg as well as in both feet.  Pain related to his lumbar radiculopathy and neck pain with radiation both state greater than the 10 on a 0-to-10 scale with 10 being the worst pain imaginable.  He is taking Elavil and gabapentin as well as use of Zanaflex with some decreased pain however pain is always present.  He has chronic issues with back pain with radiation.  He continues to take his blood pressure medicine and he denies any headaches or dizziness related to his blood pressure.  He is taking medication for his cholesterol and denies any increased muscle or joint pain related to the use of statin medication.         Review of systems he denies any current issues with chest pain or palpitations, no shortness of breath or cough.  He continues to take omeprazole and denies any current reflux symptoms or epigastric pain.  He continues to take a daily 325 mg aspirin and denies any blood in the stool or black stools, no sore throat or difficulty swallowing.  He does endorse fatigue.  He denies urinary frequency, urgency or dysuria and no loss of bowel or bladder function.   Past Medical History:  Diagnosis Date  . Diabetes mellitus without complication ([place])   . Hypertension   .  Neuropathy   . Rheumatoid arthritis [facility])     Past Surgical History:  Procedure Laterality Date  . KNEE SURGERY Right     Family History  Problem Relation Age of Onset  . Diabetes Mother   . Hypertension Mother   . Rheum arthritis Mother   . Other Father        unsure - thinks he may have history of cancer  . CAD Neg Hx     Social History   Tobacco Use  . Smoking status: Current Every Day Smoker    Packs/day: 0.10    Types: Cigarettes  . Smokeless tobacco: Never Used  Substance Use Topics  . Alcohol use: Yes    Alcohol/week: 10.0 standard drinks     Types: 10 Cans of beer per week  . Drug use: Never     Allergies  Allergen Reactions  . Pork-Derived Products Other (See Comments)    No pork products       Observations/Objective: No vital signs or physical exam conducted as visit was done via telephone  Assessment and Plan: 1. Type 2 diabetes, controlled, with peripheral neuropathy (HCC) Hemoglobin A1c from 12/13/2018 discussed with the patient.  Hemoglobin A1c at that time was 6.8 indicating good control of diabetes as this was less than A1c goal of 7 or less.  Patient provided with refills of Elavil and gabapentin for continued treatment of peripheral neuropathy and he is also to continue the use of Metformin along with a low carbohydrate diet for continued control of diabetes - amitriptyline (ELAVIL) 25 MG tablet; Take 1 tablet (25 mg total) by mouth at bedtime.  Dispense: 30 tablet; Refill: 6 - gabapentin (NEURONTIN) 300 MG capsule; Take 2 capsules (600 mg total) by mouth 3 (three) times daily.  Dispense: 180 capsule; Refill: 11 - metFORMIN (GLUCOPHAGE) 500 MG tablet; Take 1 tablet (500 mg total) by mouth daily with breakfast.  Dispense: 90 tablet; Refill: 1  2. Essential hypertension He reports that his blood pressures remain controlled on hydrochlorothiazide and new prescription provided.  He is not currently on ACE inhibitor but may benefit from addition of this over similar medicine to help with renal protection as he is diabetic. - hydrochlorothiazide (HYDRODIURIL) 25 MG tablet; Take 1 tablet (25 mg total) by mouth daily.  Dispense: 90 tablet; Refill: 1  3. Cervical myelopathy ([place]) He reports continued issues with neck pain with radiation however he states that he has been unable to have a definitive surgical date and states that he was previously incarcerated but released and has been using CBD oil to help with his pain but then had positive urine drug screen and has been told that he may have to return to prison for a parole  violation due to abnormal urine drug screen.  Patient states that he has been told that if he can obtain a note from his surgeon that patient needs to have the surgery done soon that his possible return to prison may be delayed until he can have surgery and recover however he states that he has not been able to get a definitive answer regarding surgery from his neurosurgeon.  Patient has been asked to have his lawyer's office contact neurosurgery regarding a letter as to possible timeframe of surgery and/or importance of patient having the surgery for resolution of pain.  Refills provided amitriptyline, gabapentin and Zanaflex to help with neck pain/cervical radiculopathy - amitriptyline (ELAVIL) 25 MG tablet; Take 1 tablet (25 mg total) by mouth at bedtime.  Dispense: 30 tablet; Refill: 6 - gabapentin (NEURONTIN) 300 MG capsule; Take 2 capsules (600 mg total) by mouth 3 (three) times daily.  Dispense: 180 capsule; Refill: 11 - tiZANidine (ZANAFLEX) 4 MG tablet; Take 1-2 tablets (4-8 mg total) by mouth every 6 (six) hours as needed for muscle spasms. Can cause drowsiness  Dispense: 60 tablet; Refill: 1  4. Hyperlipidemia associated with type 2 diabetes mellitus ([place]) Prescription for continued atorvastatin for hyperlipidemia associated with type 2 diabetes.  Discussed LDL goal of 70 or less to help reduce the risk of heart disease associated with diabetes - atorvastatin (LIPITOR) 20 MG tablet; Take 1 tablet (20 mg total) by mouth daily.  Dispense: 90 tablet; Refill: 1  5. Lumbar radiculopathy, chronic Patient reports chronic back pain due to lumbar radiculopathy and peripheral neuropathy.  Refills of gabapentin, tizanidine and Elavil provided. - gabapentin (NEURONTIN) 300 MG capsule; Take 2 capsules (600 mg total) by mouth 3 (three) times daily.  Dispense: 180 capsule; Refill: 11 - tiZANidine (ZANAFLEX) 4 MG tablet; Take 1-2 tablets (4-8 mg total) by mouth every 6 (six) hours as needed for muscle spasms.  Can cause drowsiness  Dispense: 60 tablet; Refill: 1  6. History of CVA (cerebrovascular accident) He has a history of CVA and initially had some visual deficits which resolved shortly after hospital discharge.  Patient is to continue to control blood sugar, lipids, hypertension and continue daily aspirin for secondary stroke prevention.  Patient is to continue omeprazole for stomach protection due to long-term use of aspirin therapy. - aspirin 325 MG tablet; Take 1 tablet (325 mg total) by mouth daily. Eat before taking medication  Dispense: 90 tablet; Refill: 3  7. Long-term use of high-risk medication He is to continue lab follow-up with comprehensive metabolic panel regarding long-term use of high-risk medications including statin therapy.  Patient's last comprehensive metabolic panel was in March of this year and patient with mild increase in glucose and otherwise normal electrolytes but did have increased AST of 62 and will need repeat comprehensive metabolic panel.  8. Gastroesophageal reflux disease, unspecified whether esophagitis present Omeprazole for treatment of acid reflux as well as stomach protection due to long-term use of aspirin therapy. - omeprazole (PRILOSEC) 20 MG capsule; Take 1 capsule (20 mg total) by mouth daily. To reduce stomach acid  Dispense: 90 capsule; Refill: 4  9. Long term current use of aspirin Continue use of omeprazole, eat prior to taking aspirin and notify the office or seek medical attention if there are any issues with blood in the stool or black stools or abdominal pain - omeprazole (PRILOSEC) 20 MG capsule; Take 1 capsule (20 mg total) by mouth daily. To reduce stomach acid  Dispense: 90 capsule; Refill: 4  Follow Up Instructions:Return in about 2 months (around 04/04/2019) for DM and chronic issues/labs, sooner if needed.    I discussed the assessment and treatment plan with the patient. The patient was provided an opportunity to ask questions and all  were answered. The patient agreed with the plan and demonstrated an understanding of the instructions.   The patient was advised to call back or seek an in-person evaluation if the symptoms worsen or if the condition fails to improve as anticipated.  I provided 15 minutes of non-face-to-face time during this encounter.   [name], MD

## 2019-02-01 NOTE — Progress Notes (Signed)
Patient verified DOB Patient has taken medication. Patient has eaten today. Patient complains of chronic leg pain scaled at a 10 currently.

## 2019-02-06 NOTE — Telephone Encounter (Signed)
Pt stopped by today asking for someone to call and answer questions he has about an upcoming test. Please advise.

## 2019-02-08 ENCOUNTER — Telehealth: Payer: Self-pay | Admitting: Neurology

## 2019-02-08 ENCOUNTER — Telehealth: Payer: Self-pay | Admitting: *Deleted

## 2019-02-08 NOTE — Telephone Encounter (Signed)
Patient advised and the number to [facility] was provided

## 2019-02-08 NOTE — Telephone Encounter (Signed)
Per Dr. Krista Blue, the patient does not need to be scheduled for NCV/EMG.  He was referred to neurosurgery due to his abnormal MRI.  I called his number back and spoke to his mother (on [place]).  She says surgery was recommended but he has not scheduled it yet.  He should discuss further needs with the neurosurgery office.  His mother verbalized understanding and will provide him with this message.

## 2019-02-08 NOTE — Telephone Encounter (Signed)
Patient called in to schedule his NCV/EMG but the order expires on 02/24/2019. He states he needs to get this done or scheduled asap to refrain from him going to jail. Patient also states he needs documentation of his order to turn into the judge by Monday.

## 2019-02-08 NOTE — Telephone Encounter (Signed)
Patient is aware of needing to reschedule his NCV/EMG with [facility] at [phone]. Patient was scheduled previously in September and walked out of the office prior to completing the procedure. Patient was also advised to go ahead and schedule the first approved surgery and then work on the second surgery approval.

## 2019-02-13 ENCOUNTER — Telehealth: Payer: Self-pay | Admitting: Family Medicine

## 2019-02-13 NOTE — Telephone Encounter (Signed)
Pt came in to request PCP [facility], he states he is having trouble setting a surgery date. His insurance only covers one visit. Please follow up as soon as possible. Pt dropped of a form for his provider to review.

## 2019-02-14 ENCOUNTER — Other Ambulatory Visit: Payer: Self-pay | Admitting: Neurological Surgery

## 2019-02-14 MED FILL — ACCU-CHEK GUIDE TEST STRIP: 50 days supply | Qty: 100 | Fill #1

## 2019-02-14 NOTE — Telephone Encounter (Signed)
MA spoke with Neurosurgery today. Patient has been in contact with their office today and a final decision was made and agreed upon. Due to Medicaid only approving a portion of the needed surgery. Patient is aware of the approved levels being completed and fusion being completed on the remaining areas. This date is set for February 1st and patient is aware.

## 2019-02-22 MED FILL — GABAPENTIN 300 MG CAPSULE: 300 | 30 days supply | Qty: 180 | Fill #0

## 2019-02-22 MED FILL — predniSONE 5 MG TABS: 5 | 30 days supply | Qty: 30 | Fill #5

## 2019-03-01 ENCOUNTER — Encounter: Payer: Self-pay | Admitting: Physical Therapy

## 2019-03-01 DIAGNOSIS — R030 Elevated blood-pressure reading, without diagnosis of hypertension: Secondary | ICD-10-CM | POA: Insufficient documentation

## 2019-03-01 NOTE — Therapy (Signed)
[facility] [address] [place], [place], [address] Phone: [phone]   Fax:  [phone]  Patient Details  Name: [name] MRN: [id number] Date of Birth: 1960-03-25 Referring Provider:  No ref. provider found  Encounter Date: 03/01/2019  PHYSICAL THERAPY DISCHARGE SUMMARY  Visits from Start of Care: 12  Current functional level related to goals / functional outcomes: Unable to assess; remaining visits last year cancelled due to clinic closing due to [place]; when clinic reopened pt did not return for further therapy.   Remaining deficits: Impaired balance and gait   Education / Equipment: HEP  Plan: Patient agrees to discharge.  Patient goals were not met. Patient is being discharged due to not returning since the last visit.  ?????     Rico Junker, PT, DPT 03/01/19    3:49 PM    [facility] [address] [place], [place], [address] Phone: [phone]   Fax:  [phone]

## 2019-03-08 NOTE — Pre-Procedure Instructions (Signed)
[facility], [address] [place] [address] Phone: [phone] Fax: [place], [address] [place] [address] Phone: [phone] Fax: [phone]      Your procedure is scheduled on Monday February 1st.  Report to [facility] Main Entrance "A" at 5:30 A.M., and check in at the Admitting office.  Call this number if you have problems the morning of surgery:  [phone]  Call [phone] if you have any questions prior to your surgery date Monday-Friday 8am-4pm    Remember:  Do not eat or drink after midnight the night before your surgery     Take these medicines the morning of surgery with A SIP OF WATER  amLODipine (NORVASC) atorvastatin (LIPITOR) gabapentin (NEURONTIN)  Follow your surgeon's instructions on when to stop Asprin.  If no instructions were given by your surgeon then you will need to call the office to get those instructions.      As of today, STOP taking any Aspirin (unless otherwise instructed by your surgeon), Aleve, Naproxen, Ibuprofen, Motrin, Advil, Goody's, BC's, all herbal medications, fish oil, and all vitamins.   HOW TO MANAGE YOUR DIABETES BEFORE AND AFTER SURGERY  Why is it important to control my blood sugar before and after surgery? . Improving blood sugar levels before and after surgery helps healing and can limit problems. . A way of improving blood sugar control is eating a healthy diet by: o  Eating less sugar and carbohydrates o  Increasing activity/exercise o  Talking with your doctor about reaching your blood sugar goals . High blood sugars (greater than 180 mg/dL) can raise your risk of infections and slow your recovery, so you will need to focus on controlling your diabetes during the weeks before surgery. . Make sure that the doctor who takes care of your  diabetes knows about your planned surgery including the date and location.  How do I manage my blood sugar before surgery? . Check your blood sugar at least 4 times a day, starting 2 days before surgery, to make sure that the level is not too high or low. o Check your blood sugar the morning of your surgery when you wake up and every 2 hours until you get to the Short Stay unit. . If your blood sugar is less than 70 mg/dL, you will need to treat for low blood sugar: o Do not take insulin. o Treat a low blood sugar (less than 70 mg/dL) with  cup of clear juice (cranberry or apple), 4 glucose tablets, OR glucose gel. Recheck blood sugar in 15 minutes after treatment (to make sure it is greater than 70 mg/dL). If your blood sugar is not greater than 70 mg/dL on recheck, call [phone] o  for further instructions. . Report your blood sugar to the short stay nurse when you get to Short Stay.  . If you are admitted to the hospital after surgery: o Your blood sugar will be checked by the staff and you will probably be given insulin after surgery (instead of oral diabetes medicines) to make sure you have good blood sugar levels. o The goal for blood sugar control after surgery is 80-180 mg/dL.     WHAT DO I DO ABOUT MY DIABETES MEDICATION?   Marland Kitchen Do not take oral diabetes medicines (pills): metFORMIN (GLUCOPHAGE)  the morning  of surgery.     The Morning of Surgery  Do not wear jewelry, make-up or nail polish.  Do not wear lotions, powders, or perfumes/colognes, or deodorant  Do not shave 48 hours prior to surgery.  Men may shave face and neck.  Do not bring valuables to the hospital.  [facility] is not responsible for any belongings or valuables.  If you are a smoker, DO NOT Smoke 24 hours prior to surgery  If you wear a CPAP at night please bring your mask the morning of surgery   Remember that you must have someone to transport you home after your surgery, and remain with you for 24  hours if you are discharged the same day.   Please bring cases for contacts, glasses, hearing aids, dentures or bridgework because it cannot be worn into surgery.    Leave your suitcase in the car.  After surgery it may be brought to your room.  For patients admitted to the hospital, discharge time will be determined by your treatment team.  Patients discharged the day of surgery will not be allowed to drive home.    Special instructions:   [facility]- Preparing For Surgery  Before surgery, you can play an important role. Because skin is not sterile, your skin needs to be as free of germs as possible. You can reduce the number of germs on your skin by washing with CHG (chlorahexidine gluconate) Soap before surgery.  CHG is an antiseptic cleaner which kills germs and bonds with the skin to continue killing germs even after washing.    Oral Hygiene is also important to reduce your risk of infection.  Remember - BRUSH YOUR TEETH THE MORNING OF SURGERY WITH YOUR REGULAR TOOTHPASTE  Please do not use if you have an allergy to CHG or antibacterial soaps. If your skin becomes reddened/irritated stop using the CHG.  Do not shave (including legs and underarms) for at least 48 hours prior to first CHG shower. It is OK to shave your face.  Please follow these instructions carefully.   1. Shower the NIGHT BEFORE SURGERY and the MORNING OF SURGERY with CHG Soap.   2. If you chose to wash your hair, wash your hair first as usual with your normal shampoo.  3. After you shampoo, rinse your hair and body thoroughly to remove the shampoo.  4. Use CHG as you would any other liquid soap. You can apply CHG directly to the skin and wash gently with a scrungie or a clean washcloth.   5. Apply the CHG Soap to your body ONLY FROM THE NECK DOWN.  Do not use on open wounds or open sores. Avoid contact with your eyes, ears, mouth and genitals (private parts). Wash Face and genitals (private parts)  with your  normal soap.   6. Wash thoroughly, paying special attention to the area where your surgery will be performed.  7. Thoroughly rinse your body with warm water from the neck down.  8. DO NOT shower/wash with your normal soap after using and rinsing off the CHG Soap.  9. Pat yourself dry with a CLEAN TOWEL.  10. Wear CLEAN PAJAMAS to bed the night before surgery, wear comfortable clothes the morning of surgery  11. Place CLEAN SHEETS on your bed the night of your first shower and DO NOT SLEEP WITH PETS.    Day of Surgery:  Please shower the morning of surgery with the CHG soap Do not apply any deodorants/lotions. Please wear clean  clothes to the hospital/surgery center.   Remember to brush your teeth WITH YOUR REGULAR TOOTHPASTE.   Please read over the following fact sheets that you were given.

## 2019-03-09 ENCOUNTER — Encounter (HOSPITAL_COMMUNITY): Payer: Self-pay

## 2019-03-09 ENCOUNTER — Other Ambulatory Visit (HOSPITAL_COMMUNITY)
Admission: RE | Admit: 2019-03-09 | Discharge: 2019-03-09 | Disposition: A | Discharge status: Home / Self Care | Payer: Medicaid Other | Source: Ambulatory Visit | Attending: Neurological Surgery | Admitting: Neurological Surgery

## 2019-03-09 ENCOUNTER — Encounter (HOSPITAL_COMMUNITY)
Admission: RE | Admit: 2019-03-09 | Discharge: 2019-03-09 | Disposition: A | Discharge status: Home / Self Care | Payer: Medicaid Other | Source: Ambulatory Visit | Attending: Neurological Surgery | Admitting: Neurological Surgery

## 2019-03-09 ENCOUNTER — Other Ambulatory Visit: Payer: Self-pay

## 2019-03-09 DIAGNOSIS — Z01818 Encounter for other preprocedural examination: Secondary | ICD-10-CM | POA: Insufficient documentation

## 2019-03-09 DIAGNOSIS — Z20822 Contact with and (suspected) exposure to covid-19: Secondary | ICD-10-CM | POA: Insufficient documentation

## 2019-03-09 HISTORY — DX: Cerebral infarction, unspecified: I63.9

## 2019-03-09 HISTORY — DX: Dyspnea, unspecified: R06.00

## 2019-03-09 LAB — COMPREHENSIVE METABOLIC PANEL
ALT: 42 U/L (ref 0–44)
AST: 64 U/L — ABNORMAL HIGH (ref 15–41)
Albumin: 3.6 g/dL (ref 3.5–5.0)
Alkaline Phosphatase: 96 U/L (ref 38–126)
Anion gap: 10 (ref 5–15)
BUN: 11 mg/dL (ref 6–20)
CO2: 23 mmol/L (ref 22–32)
Calcium: 9 mg/dL (ref 8.9–10.3)
Chloride: 107 mmol/L (ref 98–111)
Creatinine, Ser: 0.81 mg/dL (ref 0.61–1.24)
GFR calc Af Amer: >60 mL/min (ref >60)
GFR calc non Af Amer: >60 mL/min (ref >60)
Glucose, Bld: 122 mg/dL — ABNORMAL HIGH (ref 70–99)
Potassium: 4.2 mmol/L (ref 3.5–5.1)
Sodium: 140 mmol/L (ref 135–145)
Total Bilirubin: 1.1 mg/dL (ref 0.3–1.2)
Total Protein: 7.2 g/dL (ref 6.5–8.1)

## 2019-03-09 LAB — CBC
HCT: 49 % (ref 39.0–52.0)
Hemoglobin: 16.2 g/dL (ref 13.0–17.0)
MCH: 31.8 pg (ref 26.0–34.0)
MCHC: 33.1 g/dL (ref 30.0–36.0)
MCV: 96.3 fL (ref 80.0–100.0)
Platelets: 202 10*3/uL (ref 150–400)
RBC: 5.09 MIL/uL (ref 4.22–5.81)
RDW: 12.8 % (ref 11.5–15.5)
WBC: 6.2 10*3/uL (ref 4.0–10.5)
nRBC: 0 % (ref 0.0–0.2)

## 2019-03-09 LAB — TYPE AND SCREEN
ABO/RH(D): O POS
Antibody Screen: NEGATIVE

## 2019-03-09 LAB — SARS CORONAVIRUS 2 (TAT 6-24 HRS): SARS Coronavirus 2: NEGATIVE

## 2019-03-09 LAB — SURGICAL PCR SCREEN
MRSA, PCR: NEGATIVE
Staphylococcus aureus: NEGATIVE

## 2019-03-09 LAB — GLUCOSE, CAPILLARY: Glucose-Capillary: 129 mg/dL — ABNORMAL HIGH (ref 70–99)

## 2019-03-09 LAB — ABO/RH: ABO/RH(D): O POS

## 2019-03-09 MED ORDER — CHLORHEXIDINE GLUCONATE CLOTH 2 % EX PADS: 6.0000 | MEDICATED_PAD | Freq: Once | CUTANEOUS | Status: DC

## 2019-03-09 NOTE — Progress Notes (Signed)
PCP - Dr [facility]. He is aware of surgery Cardiologist - na    -  est x-ray - 12/19 EKG - today Stress Test - na ECHO - na Cardiac Cath - na      Fasting Blood Sugar - 129 Checks Blood Sugar 1_____ times a day   Aspirin Instructions:stop       COVID TEST- today   Anesthesia review: heart hx review  Patient denies shortness of breath, fever, cough and chest pain at PAT appointment   All instructions explained to the patient, with a verbal understanding of the material. Patient agrees to go over the instructions while at home for a better understanding. Patient also instructed to self quarantine after being tested for COVID-19. The opportunity to ask questions was provided.

## 2019-03-10 MED FILL — ACCU-CHEK GUIDE TEST STRIP: 50 days supply | Qty: 100 | Fill #2

## 2019-03-13 MED ORDER — EPINEPHRINE PF 1 MG/ML IJ SOLN
INTRAMUSCULAR | Status: AC
  Filled 2019-03-13: qty 1

## 2019-03-16 ENCOUNTER — Other Ambulatory Visit: Payer: Self-pay | Admitting: Pharmacist

## 2019-03-16 ENCOUNTER — Telehealth: Payer: Self-pay | Admitting: Family Medicine

## 2019-03-16 DIAGNOSIS — E1165 Type 2 diabetes mellitus with hyperglycemia: Secondary | ICD-10-CM

## 2019-03-16 DIAGNOSIS — E1142 Type 2 diabetes mellitus with diabetic polyneuropathy: Secondary | ICD-10-CM

## 2019-03-16 MED ORDER — ACCU-CHEK GUIDE W/DEVICE KIT: 1.0000 | PACK | Freq: Two times a day (BID) | 0 refills | Status: AC

## 2019-03-16 MED FILL — ACCU-CHEK GUIDE MONITOR SYS: W/DEVICE | 1 days supply | Qty: 1 | Fill #0

## 2019-03-16 NOTE — Telephone Encounter (Signed)
Rx was sent to our pharmacy here.

## 2019-03-16 NOTE — Telephone Encounter (Signed)
Pt came in to the office stating that somebody stole his  -Blood Glucose Monitoring Suppl (ACCU-CHEK GUIDE) w/Device KIT  Pt has no device to check his blood sugar and is unwilling to purchase one OTC , he would like a new device sent to [facility]. Please follow up

## 2019-03-17 ENCOUNTER — Other Ambulatory Visit (HOSPITAL_COMMUNITY)
Admission: RE | Admit: 2019-03-17 | Discharge: 2019-03-17 | Disposition: A | Discharge status: Home / Self Care | Payer: Medicaid Other | Source: Ambulatory Visit | Attending: Neurological Surgery | Admitting: Neurological Surgery

## 2019-03-17 DIAGNOSIS — Z01812 Encounter for preprocedural laboratory examination: Secondary | ICD-10-CM | POA: Insufficient documentation

## 2019-03-17 DIAGNOSIS — Z20822 Contact with and (suspected) exposure to covid-19: Secondary | ICD-10-CM | POA: Insufficient documentation

## 2019-03-17 LAB — SARS CORONAVIRUS 2 (TAT 6-24 HRS): SARS Coronavirus 2: NEGATIVE

## 2019-03-18 ENCOUNTER — Other Ambulatory Visit (HOSPITAL_COMMUNITY): Payer: Medicaid Other

## 2019-03-20 ENCOUNTER — Encounter (HOSPITAL_COMMUNITY)
Admission: RE | Disposition: A | Discharge status: Home / Self Care | Payer: Self-pay | Source: Ambulatory Visit | Attending: Neurological Surgery

## 2019-03-20 ENCOUNTER — Ambulatory Visit (HOSPITAL_COMMUNITY): Payer: Medicaid Other

## 2019-03-20 ENCOUNTER — Encounter (HOSPITAL_COMMUNITY): Payer: Self-pay | Admitting: Neurological Surgery

## 2019-03-20 ENCOUNTER — Observation Stay (HOSPITAL_COMMUNITY)
Admission: RE | Admit: 2019-03-20 | Discharge: 2019-03-21 | Disposition: A | Discharge status: Home / Self Care | Payer: Medicaid Other | Source: Ambulatory Visit | Attending: Neurological Surgery | Admitting: Neurological Surgery

## 2019-03-20 ENCOUNTER — Other Ambulatory Visit: Payer: Self-pay

## 2019-03-20 ENCOUNTER — Ambulatory Visit (HOSPITAL_COMMUNITY): Payer: Medicaid Other | Admitting: Certified Registered Nurse Anesthetist

## 2019-03-20 ENCOUNTER — Ambulatory Visit (HOSPITAL_COMMUNITY): Payer: Medicaid Other | Admitting: Physician Assistant

## 2019-03-20 DIAGNOSIS — Z8249 Family history of ischemic heart disease and other diseases of the circulatory system: Secondary | ICD-10-CM | POA: Insufficient documentation

## 2019-03-20 DIAGNOSIS — Z8673 Personal history of transient ischemic attack (TIA), and cerebral infarction without residual deficits: Secondary | ICD-10-CM | POA: Insufficient documentation

## 2019-03-20 DIAGNOSIS — M069 Rheumatoid arthritis, unspecified: Secondary | ICD-10-CM | POA: Diagnosis not present

## 2019-03-20 DIAGNOSIS — F17210 Nicotine dependence, cigarettes, uncomplicated: Secondary | ICD-10-CM | POA: Insufficient documentation

## 2019-03-20 DIAGNOSIS — M4802 Spinal stenosis, cervical region: Secondary | ICD-10-CM | POA: Diagnosis not present

## 2019-03-20 DIAGNOSIS — M2578 Osteophyte, vertebrae: Secondary | ICD-10-CM | POA: Insufficient documentation

## 2019-03-20 DIAGNOSIS — I10 Essential (primary) hypertension: Secondary | ICD-10-CM | POA: Diagnosis not present

## 2019-03-20 DIAGNOSIS — E1140 Type 2 diabetes mellitus with diabetic neuropathy, unspecified: Secondary | ICD-10-CM | POA: Insufficient documentation

## 2019-03-20 DIAGNOSIS — Z91018 Allergy to other foods: Secondary | ICD-10-CM | POA: Insufficient documentation

## 2019-03-20 DIAGNOSIS — M4712 Other spondylosis with myelopathy, cervical region: Principal | ICD-10-CM | POA: Insufficient documentation

## 2019-03-20 DIAGNOSIS — Z8261 Family history of arthritis: Secondary | ICD-10-CM | POA: Insufficient documentation

## 2019-03-20 DIAGNOSIS — Z833 Family history of diabetes mellitus: Secondary | ICD-10-CM | POA: Insufficient documentation

## 2019-03-20 DIAGNOSIS — M5000 Cervical disc disorder with myelopathy, unspecified cervical region: Secondary | ICD-10-CM | POA: Insufficient documentation

## 2019-03-20 DIAGNOSIS — Z419 Encounter for procedure for purposes other than remedying health state, unspecified: Secondary | ICD-10-CM

## 2019-03-20 HISTORY — PX: ANTERIOR CERVICAL DECOMP/DISCECTOMY FUSION: SHX1161

## 2019-03-20 LAB — GLUCOSE, CAPILLARY
Glucose-Capillary: 224 mg/dL — ABNORMAL HIGH (ref 70–99)
Glucose-Capillary: 133 mg/dL — ABNORMAL HIGH (ref 70–99)
Glucose-Capillary: 106 mg/dL — ABNORMAL HIGH (ref 70–99)
Glucose-Capillary: 168 mg/dL — ABNORMAL HIGH (ref 70–99)
Glucose-Capillary: 297 mg/dL — ABNORMAL HIGH (ref 70–99)

## 2019-03-20 SURGERY — ANTERIOR CERVICAL DECOMPRESSION/DISCECTOMY FUSION 2 LEVELS
Anesthesia: General | Site: Neck

## 2019-03-20 MED ORDER — BUPIVACAINE HCL (PF) 0.5 % IJ SOLN
INTRAMUSCULAR | Status: DC | PRN
  Administered 2019-03-20: 2 mL

## 2019-03-20 MED ORDER — SODIUM CHLORIDE 0.9% FLUSH: 3.0000 mL | Freq: Two times a day (BID) | INTRAVENOUS | Status: DC

## 2019-03-20 MED ORDER — ALUM & MAG HYDROXIDE-SIMETH 200-200-20 MG/5ML PO SUSP: 30.0000 mL | Freq: Four times a day (QID) | ORAL | Status: DC | PRN

## 2019-03-20 MED ORDER — OXYCODONE HCL 5 MG PO TABS: 5.0000 mg | ORAL_TABLET | Freq: Once | ORAL | Status: AC | PRN

## 2019-03-20 MED ORDER — SUCCINYLCHOLINE CHLORIDE 200 MG/10ML IV SOSY
PREFILLED_SYRINGE | INTRAVENOUS | Status: DC | PRN
  Administered 2019-03-20: 120 mg via INTRAVENOUS

## 2019-03-20 MED ORDER — HYDROCHLOROTHIAZIDE 25 MG PO TABS
25.0000 mg | ORAL_TABLET | Freq: Every day | ORAL | Status: DC
  Administered 2019-03-20: 19:00:00 25 mg via ORAL
  Filled 2019-03-20: qty 1

## 2019-03-20 MED ORDER — OXYCODONE-ACETAMINOPHEN 5-325 MG PO TABS
1.0000 | ORAL_TABLET | ORAL | Status: DC | PRN
  Administered 2019-03-20 – 2019-03-21 (×3): 2 via ORAL
  Filled 2019-03-20 (×3): qty 2

## 2019-03-20 MED ORDER — ACETAMINOPHEN 10 MG/ML IV SOLN: 1000.0000 mg | Freq: Once | INTRAVENOUS | Status: DC | PRN

## 2019-03-20 MED ORDER — BUPIVACAINE HCL (PF) 0.5 % IJ SOLN
INTRAMUSCULAR | Status: AC
  Filled 2019-03-20: qty 30

## 2019-03-20 MED ORDER — METHOCARBAMOL 500 MG PO TABS
ORAL_TABLET | ORAL | Status: AC
  Filled 2019-03-20: qty 1

## 2019-03-20 MED ORDER — ATORVASTATIN CALCIUM 10 MG PO TABS
20.0000 mg | ORAL_TABLET | Freq: Every day | ORAL | Status: DC
  Administered 2019-03-20: 19:00:00 20 mg via ORAL
  Filled 2019-03-20: qty 2

## 2019-03-20 MED ORDER — LIDOCAINE-EPINEPHRINE 1 %-1:100000 IJ SOLN
INTRAMUSCULAR | Status: AC
  Filled 2019-03-20: qty 1

## 2019-03-20 MED ORDER — ONDANSETRON HCL 4 MG PO TABS: 4.0000 mg | ORAL_TABLET | Freq: Four times a day (QID) | ORAL | Status: DC | PRN

## 2019-03-20 MED ORDER — CEFAZOLIN SODIUM-DEXTROSE 2-4 GM/100ML-% IV SOLN
INTRAVENOUS | Status: AC
  Filled 2019-03-20: qty 100

## 2019-03-20 MED ORDER — SODIUM CHLORIDE 0.9% FLUSH: 3.0000 mL | INTRAVENOUS | Status: DC | PRN

## 2019-03-20 MED ORDER — ROCURONIUM BROMIDE 50 MG/5ML IV SOSY
PREFILLED_SYRINGE | INTRAVENOUS | Status: DC | PRN
  Administered 2019-03-20: 50 mg via INTRAVENOUS
  Administered 2019-03-20: 20 mg via INTRAVENOUS

## 2019-03-20 MED ORDER — PROPOFOL 10 MG/ML IV BOLUS
INTRAVENOUS | Status: AC
  Filled 2019-03-20: qty 20

## 2019-03-20 MED ORDER — SODIUM CHLORIDE 0.9 % IV SOLN: 250.0000 mL | INTRAVENOUS | Status: DC

## 2019-03-20 MED ORDER — FENTANYL CITRATE (PF) 250 MCG/5ML IJ SOLN
INTRAMUSCULAR | Status: DC | PRN
  Administered 2019-03-20 (×5): 50 ug via INTRAVENOUS

## 2019-03-20 MED ORDER — SODIUM CHLORIDE 0.9 % IV SOLN: INTRAVENOUS | Status: DC | PRN

## 2019-03-20 MED ORDER — THROMBIN 5000 UNITS EX SOLR
CUTANEOUS | Status: AC
  Filled 2019-03-20: qty 5000

## 2019-03-20 MED ORDER — ACETAMINOPHEN 500 MG PO TABS: 1000.0000 mg | ORAL_TABLET | Freq: Once | ORAL | Status: DC | PRN

## 2019-03-20 MED ORDER — INSULIN ASPART 100 UNIT/ML ~~LOC~~ SOLN
0.0000 [IU] | Freq: Three times a day (TID) | SUBCUTANEOUS | Status: DC
  Administered 2019-03-21: 3 [IU] via SUBCUTANEOUS

## 2019-03-20 MED ORDER — DEXAMETHASONE SODIUM PHOSPHATE 10 MG/ML IJ SOLN
INTRAMUSCULAR | Status: AC
  Filled 2019-03-20: qty 1

## 2019-03-20 MED ORDER — PHENYLEPHRINE HCL-NACL 10-0.9 MG/250ML-% IV SOLN
INTRAVENOUS | Status: DC | PRN
  Administered 2019-03-20: 30 ug/min via INTRAVENOUS

## 2019-03-20 MED ORDER — MIDAZOLAM HCL 5 MG/5ML IJ SOLN
INTRAMUSCULAR | Status: DC | PRN
  Administered 2019-03-20: 2 mg via INTRAVENOUS

## 2019-03-20 MED ORDER — SENNA 8.6 MG PO TABS
1.0000 | ORAL_TABLET | Freq: Two times a day (BID) | ORAL | Status: DC
  Filled 2019-03-20: qty 1

## 2019-03-20 MED ORDER — METFORMIN HCL 500 MG PO TABS: 250.0000 mg | ORAL_TABLET | Freq: Every day | ORAL | Status: DC | PRN

## 2019-03-20 MED ORDER — PHENYLEPHRINE 40 MCG/ML (10ML) SYRINGE FOR IV PUSH (FOR BLOOD PRESSURE SUPPORT)
PREFILLED_SYRINGE | INTRAVENOUS | Status: DC | PRN
  Administered 2019-03-20: 40 ug via INTRAVENOUS

## 2019-03-20 MED ORDER — DOCUSATE SODIUM 100 MG PO CAPS
100.0000 mg | ORAL_CAPSULE | Freq: Two times a day (BID) | ORAL | Status: DC
  Filled 2019-03-20: qty 1

## 2019-03-20 MED ORDER — ACETAMINOPHEN 160 MG/5ML PO SOLN: 1000.0000 mg | Freq: Once | ORAL | Status: DC | PRN

## 2019-03-20 MED ORDER — LIDOCAINE-EPINEPHRINE 1 %-1:100000 IJ SOLN
INTRAMUSCULAR | Status: DC | PRN
  Administered 2019-03-20: 2 mL

## 2019-03-20 MED ORDER — ACETAMINOPHEN 325 MG PO TABS: 650.0000 mg | ORAL_TABLET | ORAL | Status: DC | PRN

## 2019-03-20 MED ORDER — 0.9 % SODIUM CHLORIDE (POUR BTL) OPTIME
TOPICAL | Status: DC | PRN
  Administered 2019-03-20: 15:00:00 1000 mL

## 2019-03-20 MED ORDER — FENTANYL CITRATE (PF) 100 MCG/2ML IJ SOLN
INTRAMUSCULAR | Status: AC
  Administered 2019-03-20: 16:00:00 50 ug via INTRAVENOUS
  Filled 2019-03-20: qty 2

## 2019-03-20 MED ORDER — OXYCODONE HCL 5 MG PO TABS
ORAL_TABLET | ORAL | Status: AC
  Administered 2019-03-20: 16:00:00 5 mg via ORAL
  Filled 2019-03-20: qty 1

## 2019-03-20 MED ORDER — FENTANYL CITRATE (PF) 250 MCG/5ML IJ SOLN
INTRAMUSCULAR | Status: AC
  Filled 2019-03-20: qty 5

## 2019-03-20 MED ORDER — METHOCARBAMOL 500 MG PO TABS
500.0000 mg | ORAL_TABLET | Freq: Four times a day (QID) | ORAL | Status: DC | PRN
  Administered 2019-03-20 – 2019-03-21 (×3): 500 mg via ORAL
  Filled 2019-03-20 (×2): qty 1

## 2019-03-20 MED ORDER — MENTHOL 3 MG MT LOZG: 1.0000 | LOZENGE | OROMUCOSAL | Status: DC | PRN

## 2019-03-20 MED ORDER — DEXAMETHASONE SODIUM PHOSPHATE 10 MG/ML IJ SOLN
INTRAMUSCULAR | Status: DC | PRN
  Administered 2019-03-20: 5 mg via INTRAVENOUS

## 2019-03-20 MED ORDER — AMLODIPINE BESYLATE 5 MG PO TABS
5.0000 mg | ORAL_TABLET | Freq: Every day | ORAL | Status: DC
  Administered 2019-03-20: 19:00:00 5 mg via ORAL
  Filled 2019-03-20: qty 1

## 2019-03-20 MED ORDER — SUGAMMADEX SODIUM 200 MG/2ML IV SOLN
INTRAVENOUS | Status: DC | PRN
  Administered 2019-03-20: 200 mg via INTRAVENOUS

## 2019-03-20 MED ORDER — THROMBIN 5000 UNITS EX SOLR: OROMUCOSAL | Status: DC | PRN

## 2019-03-20 MED ORDER — CEFAZOLIN SODIUM-DEXTROSE 2-4 GM/100ML-% IV SOLN
2.0000 g | INTRAVENOUS | Status: AC
  Administered 2019-03-20: 2 g via INTRAVENOUS

## 2019-03-20 MED ORDER — OXYCODONE HCL 5 MG/5ML PO SOLN: 5.0000 mg | Freq: Once | ORAL | Status: AC | PRN

## 2019-03-20 MED ORDER — PROPOFOL 10 MG/ML IV BOLUS
INTRAVENOUS | Status: DC | PRN
  Administered 2019-03-20: 150 mg via INTRAVENOUS

## 2019-03-20 MED ORDER — GABAPENTIN 300 MG PO CAPS
600.0000 mg | ORAL_CAPSULE | Freq: Two times a day (BID) | ORAL | Status: DC
  Administered 2019-03-20: 20:00:00 600 mg via ORAL
  Filled 2019-03-20: qty 2

## 2019-03-20 MED ORDER — ONDANSETRON HCL 4 MG/2ML IJ SOLN
INTRAMUSCULAR | Status: AC
  Filled 2019-03-20: qty 2

## 2019-03-20 MED ORDER — MORPHINE SULFATE (PF) 2 MG/ML IV SOLN
2.0000 mg | INTRAVENOUS | Status: DC | PRN
  Administered 2019-03-20: 20:00:00 2 mg via INTRAVENOUS
  Filled 2019-03-20: qty 1

## 2019-03-20 MED ORDER — BISACODYL 10 MG RE SUPP: 10.0000 mg | Freq: Every day | RECTAL | Status: DC | PRN

## 2019-03-20 MED ORDER — PHENOL 1.4 % MT LIQD
1.0000 | OROMUCOSAL | Status: DC | PRN
  Administered 2019-03-20: 19:00:00 1 via OROMUCOSAL
  Filled 2019-03-20: qty 177

## 2019-03-20 MED ORDER — MIDAZOLAM HCL 2 MG/2ML IJ SOLN
INTRAMUSCULAR | Status: AC
  Filled 2019-03-20: qty 2

## 2019-03-20 MED ORDER — FENTANYL CITRATE (PF) 100 MCG/2ML IJ SOLN
25.0000 ug | INTRAMUSCULAR | Status: DC | PRN
  Administered 2019-03-20: 16:00:00 50 ug via INTRAVENOUS

## 2019-03-20 MED ORDER — FLEET ENEMA 7-19 GM/118ML RE ENEM: 1.0000 | ENEMA | Freq: Once | RECTAL | Status: DC | PRN

## 2019-03-20 MED ORDER — LACTATED RINGERS IV SOLN: INTRAVENOUS | Status: DC

## 2019-03-20 MED ORDER — LIDOCAINE 2% (20 MG/ML) 5 ML SYRINGE
INTRAMUSCULAR | Status: DC | PRN
  Administered 2019-03-20: 80 mg via INTRAVENOUS

## 2019-03-20 MED ORDER — ONDANSETRON HCL 4 MG/2ML IJ SOLN: 4.0000 mg | Freq: Four times a day (QID) | INTRAMUSCULAR | Status: DC | PRN

## 2019-03-20 MED ORDER — METHOCARBAMOL 1000 MG/10ML IJ SOLN
500.0000 mg | Freq: Four times a day (QID) | INTRAVENOUS | Status: DC | PRN
  Filled 2019-03-20: qty 5

## 2019-03-20 MED ORDER — POLYETHYLENE GLYCOL 3350 17 G PO PACK: 17.0000 g | PACK | Freq: Every day | ORAL | Status: DC | PRN

## 2019-03-20 MED ORDER — ONDANSETRON HCL 4 MG/2ML IJ SOLN
INTRAMUSCULAR | Status: DC | PRN
  Administered 2019-03-20: 4 mg via INTRAVENOUS

## 2019-03-20 MED ORDER — CEFAZOLIN SODIUM-DEXTROSE 2-4 GM/100ML-% IV SOLN
2.0000 g | Freq: Three times a day (TID) | INTRAVENOUS | Status: AC
  Administered 2019-03-20 – 2019-03-21 (×2): 2 g via INTRAVENOUS
  Filled 2019-03-20 (×2): qty 100

## 2019-03-20 MED ORDER — LIDOCAINE 2% (20 MG/ML) 5 ML SYRINGE
INTRAMUSCULAR | Status: AC
  Filled 2019-03-20: qty 5

## 2019-03-20 MED ORDER — ACETAMINOPHEN 650 MG RE SUPP: 650.0000 mg | RECTAL | Status: DC | PRN

## 2019-03-20 SURGICAL SUPPLY — 52 items
ALLOGRAFT LORDOTIC CC 7X11X14 (Bone Implant) ×2 IMPLANT
BAG DECANTER FOR FLEXI CONT (MISCELLANEOUS) ×2 IMPLANT
BIT DRILL ACP 13 (DRILL) ×1 IMPLANT
BIT DRILL NEURO 2X3.1 SFT TUCH (MISCELLANEOUS) ×1 IMPLANT
BNDG GAUZE ELAST 4 BULKY (GAUZE/BANDAGES/DRESSINGS) ×4 IMPLANT
BUR BARREL STRAIGHT FLUTE 4.0 (BURR) ×2 IMPLANT
CANISTER SUCT 3000ML PPV (MISCELLANEOUS) ×2 IMPLANT
COVER WAND RF STERILE (DRAPES) IMPLANT
DECANTER SPIKE VIAL GLASS SM (MISCELLANEOUS) ×2 IMPLANT
DERMABOND ADVANCED (GAUZE/BANDAGES/DRESSINGS) ×1
DERMABOND ADVANCED .7 DNX12 (GAUZE/BANDAGES/DRESSINGS) ×1 IMPLANT
DRAPE LAPAROTOMY 100X72 PEDS (DRAPES) ×2 IMPLANT
DRAPE MICROSCOPE LEICA (MISCELLANEOUS) IMPLANT
DRILL ACP 13 (DRILL) ×2
DRILL NEURO 2X3.1 SOFT TOUCH (MISCELLANEOUS) ×2
DURAPREP 6ML APPLICATOR 50/CS (WOUND CARE) ×2 IMPLANT
ELECT REM PT RETURN 9FT ADLT (ELECTROSURGICAL) ×2
ELECTRODE REM PT RTRN 9FT ADLT (ELECTROSURGICAL) ×1 IMPLANT
GAUZE 4X4 16PLY RFD (DISPOSABLE) IMPLANT
GAUZE SPONGE 4X4 12PLY STRL (GAUZE/BANDAGES/DRESSINGS) IMPLANT
GLOVE BIOGEL PI IND STRL 8.5 (GLOVE) ×1 IMPLANT
GLOVE BIOGEL PI INDICATOR 8.5 (GLOVE) ×1
GLOVE ECLIPSE 8.5 STRL (GLOVE) ×2 IMPLANT
GLOVE ECLIPSE 9.0 STRL (GLOVE) ×2 IMPLANT
GLOVE EXAM NITRILE XL STR (GLOVE) IMPLANT
GLOVE SURG SS PI 7.5 STRL IVOR (GLOVE) ×12 IMPLANT
GOWN STRL REUS W/ TWL LRG LVL3 (GOWN DISPOSABLE) ×2 IMPLANT
GOWN STRL REUS W/ TWL XL LVL3 (GOWN DISPOSABLE) ×1 IMPLANT
GOWN STRL REUS W/TWL 2XL LVL3 (GOWN DISPOSABLE) ×2 IMPLANT
GOWN STRL REUS W/TWL LRG LVL3 (GOWN DISPOSABLE) ×2
GOWN STRL REUS W/TWL XL LVL3 (GOWN DISPOSABLE) ×1
HALTER HD/CHIN CERV TRACTION D (MISCELLANEOUS) ×2 IMPLANT
HEMOSTAT POWDER KIT SURGIFOAM (HEMOSTASIS) ×2 IMPLANT
INTERLOCK LORDOTIC CC 5X11X14M ×1 IMPLANT
KIT BASIN OR (CUSTOM PROCEDURE TRAY) ×2 IMPLANT
KIT TURNOVER KIT B (KITS) ×2 IMPLANT
LORDOTIC CC ALLOGRAFT 5X11X14M ×2 IMPLANT
NEEDLE HYPO 22GX1.5 SAFETY (NEEDLE) ×2 IMPLANT
NEEDLE SPNL 22GX3.5 QUINCKE BK (NEEDLE) ×4 IMPLANT
NS IRRIG 1000ML POUR BTL (IV SOLUTION) ×2 IMPLANT
PACK LAMINECTOMY NEURO (CUSTOM PROCEDURE TRAY) ×2 IMPLANT
PAD ARMBOARD 7.5X6 YLW CONV (MISCELLANEOUS) ×6 IMPLANT
PLATE ACP 1-LEVEL 1.6V20 (Plate) ×2 IMPLANT
PLATE ACP 1-LEVEL1.6V22 (Plate) ×2 IMPLANT
PUTTY BONE ATTRAX 1CC CYLINDER (Putty) ×4 IMPLANT
RUBBERBAND STERILE (MISCELLANEOUS) IMPLANT
SCREW ACP ST VARI 3.5X13 (Screw) ×16 IMPLANT
SPONGE INTESTINAL PEANUT (DISPOSABLE) ×2 IMPLANT
SUT VIC AB 4-0 RB1 18 (SUTURE) ×4 IMPLANT
TOWEL GREEN STERILE (TOWEL DISPOSABLE) ×2 IMPLANT
TOWEL GREEN STERILE FF (TOWEL DISPOSABLE) ×2 IMPLANT
WATER STERILE IRR 1000ML POUR (IV SOLUTION) ×2 IMPLANT

## 2019-03-20 NOTE — Transfer of Care (Signed)
Immediate Anesthesia Transfer of Care Note  Patient: Tony Ortega  Procedure(s) Performed: Cervical four-five Cervical six-seven  Anterior cervical decompression/discectomy/fusion (N/A Neck)  Patient Location: PACU  Anesthesia Type:General  Level of Consciousness: awake, alert  and oriented  Airway & Oxygen Therapy: Patient Spontanous Breathing  Post-op Assessment: Report given to RN and Post -op Vital signs reviewed and stable  Post vital signs: Reviewed and stable  Last Vitals:  Vitals Value Taken Time  BP 161/89 03/20/19 1545  Temp    Pulse 77 03/20/19 1552  Resp 15 03/20/19 1552  SpO2 98 % 03/20/19 1552  Vitals shown include unvalidated device data.  Last Pain:  Vitals:   03/20/19 1545  PainSc: (P) 10-Worst pain ever      Patients Stated Pain Goal: (P) 3 ([id number])  Complications: No apparent anesthesia complications

## 2019-03-20 NOTE — H&P (Signed)
Tony Ortega is an 59 y.o. male.   Chief Complaint: Neck pain stiffness weakness and numbness in the upper extremities and the lower extremities. HPI: Tony Ortega is a 59 year old individual who notes that he has had problems with neck pain and weakness in his arms that has been developing for some time.  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  He was advised regarding the need for surgery however at the time the patient was not convinced that he needed surgery and he did not return for follow-up appointments.  Some months later he returned noting that he is still having substantial problems and I again advised surgical intervention.  We wanted to obtain permission to do anterior decompression and arthroplasty however this was not allowed for the Medicaid system at 2 separate levels.  It was then decided that he would be best served with undergoing a decompression and fusion at both levels and he is now admitted for this process.  Past Medical History:  Diagnosis Date  . Diabetes mellitus without complication ([place])   . Dyspnea   . Hypertension   . Neuropathy   . Rheumatoid arthritis ([place])   . Stroke [facility])     Past Surgical History:  Procedure Laterality Date  . KNEE SURGERY Right     Family History  Problem Relation Age of Onset  . Diabetes Mother   . Hypertension Mother   . Rheum arthritis Mother   . Other Father        unsure - thinks he may have history of cancer  . CAD Neg Hx    Social History:  reports that he has been smoking cigarettes. He has a 1.20 pack-year smoking history. He has never used smokeless tobacco. He reports current alcohol use of about 10.0 standard drinks of alcohol per week. He reports that he  does not use drugs.  Allergies:  Allergies  Allergen Reactions  . Pork-Derived Products Other (See Comments)    No pork products    No medications prior to admission.    No results found for this or any previous visit (from the past 48 hour(s)). No results found.  Review of Systems  There were no vitals taken for this visit. Physical Exam  Constitutional: He is oriented to person, place, and time. He appears well-developed and well-nourished.  HENT:  Head: Normocephalic and atraumatic.  Eyes: Pupils are equal, round, and reactive to light. Conjunctivae and EOM are normal.  Neck:  Marked decrease in range of motion turning 30 degrees left and right flexing and extending about 50% of normal.  Positive Lhermitte's and positive Spurling with flexion.  Positive Lhermitte's with extension also.  Marked muscle spasm in both sides of the neck.  Cardiovascular: Regular rhythm.  Respiratory: Effort normal and breath sounds normal.  GI: Soft. Bowel sounds are normal.  Musculoskeletal:     Cervical back: Normal range of motion and neck supple.     Comments: Marked spasticity of the lower extremities with increased tone in the major muscle groups.  Gait spasticity is noted.  Decreased fine motor abilities in the upper extremities.  Neurological: He is alert and oriented to person, place, and time.  Absent reflexes in the bicep and tricep 3+ reflexes in  the patellae clonus at the ankles upgoing Babinski's bilaterally.  Increased tone in the lower extremities increased tone in the distal upper extremities with decreased grip and decreased range of motion. Decreased rapid alternating movements in the upper extremities with grip strength graded at 4 out of 5 scissored gait noted with wide base and marked difficulty with balance positive Romberg test.  Skin: Skin is warm and dry.  Psychiatric: He has a normal mood and affect. His behavior is normal. Judgment and thought content normal.      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  Plan: Anterior decompression and arthrodesis C4-5 and C6-C7.  Earleen Newport, MD 03/20/2019, 7:46 AM

## 2019-03-20 NOTE — Progress Notes (Signed)
Patient ID: Tony Ortega, male   DOB: 06/11/1960, 59 y.o.   MRN: [id number] Vital signs are stable Motor function is intact Patient is having some stiffness in the neck But overall he appears to be doing well

## 2019-03-20 NOTE — Anesthesia Preprocedure Evaluation (Signed)
Anesthesia Evaluation  Patient identified by MRN, date of birth, ID band Patient awake    Reviewed: Allergy & Precautions, NPO status , Patient's Chart, lab work & pertinent test results  History of Anesthesia Complications Negative for: history of anesthetic complications  Airway Mallampati: II  TM Distance: >3 FB Neck ROM: Full    Dental  (+) Dental Advisory Given   Pulmonary Current Smoker and Patient abstained from smoking.,    breath sounds clear to auscultation       Cardiovascular hypertension, Pt. on medications (-) angina(-) Past MI and (-) CHF  Rhythm:Regular     Neuro/Psych 09/2018 CVA fully recovered  Neuromuscular disease CVA, No Residual Symptoms negative psych ROS   GI/Hepatic Neg liver ROS, GERD  Medicated and Controlled,  Endo/Other  diabetes  Renal/GU negative Renal ROS     Musculoskeletal  (+) Arthritis ,   Abdominal   Peds  Hematology   Anesthesia Other Findings   Reproductive/Obstetrics                             Anesthesia Physical Anesthesia Plan  ASA: II  Anesthesia Plan: General   Post-op Pain Management:    Induction: Intravenous  PONV Risk Score and Plan: 1 and Ondansetron and Dexamethasone  Airway Management Planned: Oral ETT  Additional Equipment: None  Intra-op Plan:   Post-operative Plan: Extubation in OR  Informed Consent: I have reviewed the patients History and Physical, chart, labs and discussed the procedure including the risks, benefits and alternatives for the proposed anesthesia with the patient or authorized representative who has indicated his/her understanding and acceptance.     Dental advisory given  Plan Discussed with: CRNA and Surgeon  Anesthesia Plan Comments:         Anesthesia Quick Evaluation

## 2019-03-20 NOTE — Anesthesia Procedure Notes (Signed)
Procedure Name: Intubation Date/Time: 03/20/2019 1:36 PM Performed by: Griffin Dakin, CRNA Pre-anesthesia Checklist: Patient identified, Emergency Drugs available, Suction available and Patient being monitored Patient Re-evaluated:Patient Re-evaluated prior to induction Oxygen Delivery Method: Circle system utilized Preoxygenation: Pre-oxygenation with 100% oxygen Induction Type: IV induction Ventilation: Mask ventilation without difficulty Laryngoscope Size: Glidescope and 4 Grade View: Grade I Tube type: Oral Tube size: 7.5 mm Number of attempts: 1 Airway Equipment and Method: Rigid stylet and Video-laryngoscopy Placement Confirmation: ETT inserted through vocal cords under direct vision,  positive ETCO2 and breath sounds checked- equal and bilateral Secured at: 25 cm Tube secured with: Tape Dental Injury: Teeth and Oropharynx as per pre-operative assessment

## 2019-03-20 NOTE — Progress Notes (Signed)
Patient c/o feeling "jittery" and felt his blood sugar was dropping; patient further stated that he starts to have hypoglycemic symptoms with sugars < 125. Last blood sugar at 11:18 was 133. Blood sugar rechecked @ 12:24  is 106. Dr. Ermalene Postin notified of patient feeling symptomatic. No further orders received.

## 2019-03-20 NOTE — Op Note (Signed)
Date of surgery: 03/20/2019 Preoperative diagnosis: Cervical spondylosis with myelopathy C3-4-5 and C6-C7 Postoperative diagnosis: Same Procedure: Anterior cervical decompression C4-5 and C6-C7 arthrodesis with structural allograft and anterior plate fixation [id number] and C6-C7 Surgeon: Kristeen Miss First Assistant: Deri Fuelling, MD Anesthesia: General endotracheal Indication Tony Ortega is a 59 year old individual who is had significant cervical myelopathy for a long period of time an MRI in the past year demonstrates that the patient has severe cord compression at C4-5 and again at C6-C7 he was advised regarding surgical decompression he is now admitted for this procedure.  Procedure: Patient was brought to the operating room supine on the stretcher after the smooth induction of general endotracheal anesthesia he was placed in 5 pounds of halter traction neck was prepped with alcohol DuraPrep and draped in a sterile fashion transverse incision was created in the neck and the dissection was carried down through the platysma the plane between the sternocleidomastoid and strap muscles dissected bluntly until the first prevertebral space was reached this was marked as C5-6 on a radiograph with the needle in the disc space then the dissection was carried inferiorly to expose C6-C7 here some ventral osteophytes were removed and the space was able to be entered a series of rongeurs and curettes were used to evacuate a significant quantity of severely degenerated desiccated disc material as the disc base was opened further the posterior longitudinal ligament was reached and there was noted to be some free fragments of disc posteriorly these were removed and the area was carefully decompressed once all the disc had been removed the interspace was sized for appropriate size spacer and was felt that a 5 mm tall by 11 x 14 spacer would fit best into this interval this was placed into the interval after adequately  decorticating each surface then a 20 mm titanium plate was placed over C6-C7 and secured with 4 variable angle 13 mm screws.  Attention was then turned to C3-4 C5.  Discectomy was performed in a similar fashion and there was noted to be some subligamentously herniated disc at the C4-C5 level this was removed in a piecemeal fashion along with some substantial osteophytes from the inferior margin of the body C4 and superior margin of body C5 once this was cleared on the left and the right side the interspace was sized after smoothing and decorticating the endplates a 7 mm graft was filled to fit best and here this was placed in the interval and then some beta tricalcium phosphate/hyaluronic acid substrate was placed in the lateral gutters on both levels a 22 mm plate was placed over C4-C5 and secured with 4 variable angle 13 mm screws.  The system was locked in the position and final radiograph was obtained once hemostasis was verified the soft tissues the platysma was closed with 4-0 Vicryl in interrupted fashion and 4-0 Vicryl was used in a subcuticular skin Dermabond was placed on the skin and blood loss for the procedure was estimated at 75 cc.

## 2019-03-21 DIAGNOSIS — M4712 Other spondylosis with myelopathy, cervical region: Secondary | ICD-10-CM | POA: Diagnosis not present

## 2019-03-21 LAB — HEMOGLOBIN A1C
Hgb A1c MFr Bld: 6.7 % — ABNORMAL HIGH (ref 4.8–5.6)
Mean Plasma Glucose: 146 mg/dL

## 2019-03-21 LAB — GLUCOSE, CAPILLARY: Glucose-Capillary: 170 mg/dL — ABNORMAL HIGH (ref 70–99)

## 2019-03-21 MED ORDER — HYDROCODONE-ACETAMINOPHEN 5-325 MG PO TABS: 1.0000 | ORAL_TABLET | Freq: Four times a day (QID) | ORAL | 0 refills | Status: DC | PRN

## 2019-03-21 MED ORDER — METHOCARBAMOL 500 MG PO TABS: 500.0000 mg | ORAL_TABLET | Freq: Four times a day (QID) | ORAL | 1 refills | Status: DC

## 2019-03-21 MED FILL — METHOCARBAMOL 500 MG TABS: 500 | 7 days supply | Qty: 30 | Fill #0

## 2019-03-21 NOTE — Plan of Care (Signed)
Patient alert and oriented, mae's well, voiding adequate amount of urine, swallowing without difficulty, no c/o pain at time of discharge. Patient discharged home with family. Script and discharged instructions given to patient. Patient and family stated understanding of instructions given. Patient has an appointment with Dr. Elsner  

## 2019-03-21 NOTE — Evaluation (Signed)
Occupational Therapy Evaluation Patient Details Name: Tony Ortega MRN: [id number] DOB: 06/01/1960 Today's Date: 03/21/2019    History of Present Illness 59 yo male C4-7 decompression and fixation. PMH including DM type two, HTN, neuropathy, RA, R knee sx, and CVA.    Clinical Impression   PTA, pt was living alone and was independent with use of SPC for mobility. Currently, pt requires Supervision for ADLs and functional mobility using SPC. Provided education and handout on cervical precautions, collar management, bed mobility, grooming, UB ADLs, LB ADLs, toileting, and tub transfer; pt demonstrated understanding. Answered all pt questions. Recommend dc home once medically stable per physician. All acute OT needs met and will sign off. Thank you.    Follow Up Recommendations  No OT follow up    Equipment Recommendations  None recommended by OT;Other (comment)(Would benefit from soft collar)    Recommendations for Other Services       Precautions / Restrictions Precautions Precautions: Cervical Precaution Booklet Issued: Yes (comment) Precaution Comments: No brace per order. Would benefit from soft collar for adherance to precautions.  Restrictions Weight Bearing Restrictions: No      Mobility Bed Mobility Overal bed mobility: Needs Assistance Bed Mobility: Rolling;Sidelying to Sit;Sit to Sidelying Rolling: Supervision Sidelying to sit: Supervision     Sit to sidelying: Supervision General bed mobility comments: Education on log roll for cervical precations  Transfers Overall transfer level: Needs assistance Equipment used: Straight cane Transfers: Sit to/from Stand Sit to Stand: Supervision         General transfer comment: supervision for safety.    Balance Overall balance assessment: Mild deficits observed, not formally tested                                         ADL either performed or assessed with clinical judgement   ADL Overall  ADL's : Needs assistance/impaired                                       General ADL Comments: Pt performing ADLs and functional mobility at Supervision level. Providing education on cervical precautions, bed mobility, collar management, UB ADLs, LB ADLs, toileting, grooming, tub transfer, and functional mobility including stairs.      Vision         Perception     Praxis      Pertinent Vitals/Pain Pain Assessment: Faces Faces Pain Scale: Hurts little more Pain Location: Grimacing with swallowing Pain Descriptors / Indicators: Constant;Discomfort;Grimacing Pain Intervention(s): Monitored during session     Hand Dominance Right   Extremity/Trunk Assessment Upper Extremity Assessment Upper Extremity Assessment: Overall WFL for tasks assessed   Lower Extremity Assessment Lower Extremity Assessment: RLE deficits/detail RLE Deficits / Details: Right knee weakness at baseline RLE Coordination: decreased gross motor   Cervical / Trunk Assessment Cervical / Trunk Assessment: Other exceptions Cervical / Trunk Exceptions: c/p cervical sx   Communication Communication Communication: No difficulties   Cognition Arousal/Alertness: Awake/alert Behavior During Therapy: WFL for tasks assessed/performed Overall Cognitive Status: Within Functional Limits for tasks assessed                                 General Comments: Impulsive but feel this is personality and baseline  General Comments       Exercises     Shoulder Instructions      Home Living Family/patient expects to be discharged to:: Private residence Living Arrangements: Alone;Parent(He lives in an apt off of his mothers home) Available Help at Discharge: Family;Available 24 hours/day(staying with his mom for first few days) Type of Home: House Home Access: Level entry;Stairs to enter(two steps to his mom's home; level for his) Entrance Stairs-Number of Steps: 2 Entrance  Stairs-Rails: Right Home Layout: One level     Bathroom Shower/Tub: Teacher, early years/pre: Standard     Home Equipment: Cane - single point;Shower seat          Prior Functioning/Environment Level of Independence: Independent with assistive device(s)        Comments: SCP for mobility        OT Problem List:        OT Treatment/Interventions:      OT Goals(Current goals can be found in the care plan section) Acute Rehab OT Goals Patient Stated Goal: Go home OT Goal Formulation: All assessment and education complete, DC therapy  OT Frequency:     Barriers to D/C:            Co-evaluation              AM-PAC OT "6 Clicks" Daily Activity     Outcome Measure Help from another person eating meals?: None Help from another person taking care of personal grooming?: None Help from another person toileting, which includes using toliet, bedpan, or urinal?: None Help from another person bathing (including washing, rinsing, drying)?: None Help from another person to put on and taking off regular upper body clothing?: None Help from another person to put on and taking off regular lower body clothing?: None 6 Click Score: 24   End of Session Equipment Utilized During Treatment: Other (comment)(SPC) Nurse Communication: Mobility status  Activity Tolerance: Patient tolerated treatment well Patient left: in bed;with call bell/phone within reach  OT Visit Diagnosis: Unsteadiness on feet (R26.81);Other abnormalities of gait and mobility (R26.89);Muscle weakness (generalized) (M62.81)                Time: 1610-9604 OT Time Calculation (min): 15 min Charges:  OT General Charges $OT Visit: 1 Visit OT Evaluation $OT Eval Low Complexity: 1 Low  Meghanne Pletz MSOT, OTR/L Acute Rehab Pager: [phone] Office: [place] 03/21/2019, 8:16 AM

## 2019-03-21 NOTE — Progress Notes (Signed)
PT Cancellation Note  Patient Details Name: Tony Ortega MRN: [id number] DOB: 1960/11/02   Cancelled Treatment:    Reason Eval/Treat Not Completed: Pt leaving unit in wheelchair for d/c upon PT arrival. Pt reported he did not wish to wait for PT session at this time.   Thelma Comp 03/21/2019, 8:51 AM   Rolinda Roan, PT, DPT Acute Rehabilitation Services Pager: [phone] Office: [phone]

## 2019-03-21 NOTE — Care Management (Signed)
No HH orders or recs at this time, No DME orders or recs. No other CM needs identified.

## 2019-03-21 NOTE — Discharge Summary (Signed)
Physician Discharge Summary  Patient ID: Tony Ortega MRN: [id number] DOB/AGE: Feb 19, 1960 59 y.o.  Admit date: 03/20/2019 Discharge date: 03/21/2019  Admission Diagnoses: Cervical spondylosis with myelopathy C4-5 C6-C7  Discharge Diagnoses: Cervical spondylosis with myelopathy C4-5 C6-C7 Active Problems:   Cervical disc disease with myelopathy   Discharged Condition: good  Hospital Course: Admitted undergo surgical decompression of C4-5 and C6-C7 he tolerated surgery well  Consults: None  Significant Diagnostic Studies: None  Treatments: surgery: Anterior cervical decompression and C4-5   C6-7 arthrodesis with structural allograft plate fixation  Discharge Exam: Blood pressure (!) 148/78, pulse 76, temperature 97.9 F (36.6 C), temperature source Oral, resp. rate 18, height '5\' 7"'  (1.702 m), weight 67.1 kg, SpO2 97 %. And is clean and dry motor function is intact moderate spasticity noted  Disposition: Discharge disposition: 01-Home or Self Care       Discharge Instructions    Call MD for:  redness, tenderness, or signs of infection (pain, swelling, redness, odor or green/yellow discharge around incision site)   Complete by: As directed    Call MD for:  severe uncontrolled pain   Complete by: As directed    Call MD for:  temperature >100.4   Complete by: As directed    Diet - low sodium heart healthy   Complete by: As directed    Discharge instructions   Complete by: As directed    Okay to shower. Do not apply salves or appointments to incision. No heavy lifting with the upper extremities greater than 15 pounds. May resume driving when not requiring pain medication and patient feels comfortable with doing so.   Incentive spirometry RT   Complete by: As directed    Increase activity slowly   Complete by: As directed      Allergies as of 03/21/2019      Reactions   Pork-derived Products Other (See Comments)   No pork products      Medication List    TAKE these  medications   Accu-Chek FastClix Lancets Misc Use as directed twice daily to test blood sugar.   Accu-Chek Guide test strip Generic drug: glucose blood Use as instructed to check blood sugars up to 4 times per day   Accu-Chek Guide w/Device Kit 1 each by Does not apply route 2 (two) times daily. Use as directed twice daily to test blood sugar. E11.42   amitriptyline 25 MG tablet Commonly known as: ELAVIL Take 1 tablet (25 mg total) by mouth at bedtime.   amLODipine 5 MG tablet Commonly known as: NORVASC Take 1 tablet (5 mg total) by mouth daily. To lower blood pressure What changed: additional instructions   aspirin 325 MG tablet Take 1 tablet (325 mg total) by mouth daily. Eat before taking medication   atorvastatin 20 MG tablet Commonly known as: LIPITOR Take 1 tablet (20 mg total) by mouth daily.   gabapentin 300 MG capsule Commonly known as: NEURONTIN Take 2 capsules (600 mg total) by mouth 3 (three) times daily. What changed: when to take this   hydrochlorothiazide 25 MG tablet Commonly known as: HYDRODIURIL Take 1 tablet (25 mg total) by mouth daily.   HYDROcodone-acetaminophen 5-325 MG tablet Commonly known as: NORCO/VICODIN Take 1 tablet by mouth every 6 (six) hours as needed for moderate pain or severe pain.   ibuprofen 800 MG tablet Commonly known as: ADVIL Take 1 tablet (800 mg total) by mouth 3 (three) times daily. What changed:   when to take this  reasons to  take this   metFORMIN 500 MG tablet Commonly known as: GLUCOPHAGE Take 1 tablet (500 mg total) by mouth daily with breakfast. What changed:   how much to take  when to take this  reasons to take this   methocarbamol 500 MG tablet Commonly known as: Robaxin Take 1 tablet (500 mg total) by mouth 4 (four) times daily.   omeprazole 20 MG capsule Commonly known as: PRILOSEC Take 1 capsule (20 mg total) by mouth daily. To reduce stomach acid   predniSONE 5 MG tablet Commonly known as:  DELTASONE Take 5 mg by mouth daily.   tiZANidine 4 MG tablet Commonly known as: Zanaflex Take 1-2 tablets (4-8 mg total) by mouth every 6 (six) hours as needed for muscle spasms. Can cause drowsiness      Follow-up Information    Kristeen Miss, MD Follow up.   Specialty: Neurosurgery Contact information: [address] [place] [address] [phone]           Signed: Earleen Newport 03/21/2019, 7:54 AM

## 2019-03-21 NOTE — Progress Notes (Signed)
Orthopedic Tech Progress Note Patient Details:  JYHEIM ARNTZEN 05-06-1960 [id number] RN called requesting SOFT COLLAR for patient. Ortho Devices Type of Ortho Device: Soft collar Ortho Device/Splint Location: neck Ortho Device/Splint Interventions: Ordered, Application   Post Interventions Patient Tolerated: Well Instructions Provided: Care of device, Adjustment of device   Janit Pagan 03/21/2019, 8:07 AM

## 2019-03-22 ENCOUNTER — Telehealth: Payer: Self-pay

## 2019-03-22 NOTE — Telephone Encounter (Signed)
Transition Care Management Follow-up Telephone Call  Date of discharge and from where: 03/21/2019, [facility]   How have you been since you were released from the hospital? He said he is " hurting but doing good."  Any questions or concerns? He had not received his pain medication. He said that he was aware that the medication is not carried by [facility]. In the meantime, he called Dr Ellene Route and requested that the order for the medication be sent to [facility] and will pick it up later today.   He then said that he would feel better tomorrow and would call back with any questions and schedule an appointment with Dr Chapman Fitch  Items Reviewed:  Did the pt receive and understand the discharge instructions provided? Yes   Medications obtained and verified? He had all medications except the norco. He has the medication list and did not have any questions.   Any new allergies since your discharge? None reported   Do you have support at home? Not discussed, he did not want to talk any longer  Other (ie: DME, Home Health, etc) no home health or DME ordered.  Did not get to confirm that he has a glucometer.  Functional Questionnaire: (I = Independent and D = Dependent) ADL's:independent   Follow up appointments reviewed:    PCP Hospital f/u appt confirmed? He said that he would call at a later time to schedule an appt.   [facility] f/u appt confirmed? Needs to schedule follow up with neurosurgery  Are transportation arrangements needed? Not discussed  If their condition worsens, is the pt aware to call  their PCP or go to the ED? Not discussed, he did not want to talk any longer.  Was the patient provided with contact information for the PCP's office or ED? He has the phone number for the clinic  Was the pt encouraged to call back with questions or concerns? yes

## 2019-03-23 ENCOUNTER — Encounter: Payer: Self-pay | Admitting: *Deleted

## 2019-03-23 NOTE — Anesthesia Postprocedure Evaluation (Signed)
Anesthesia Post Note  Patient: Tony Ortega  Procedure(s) Performed: Cervical four-five Cervical six-seven  Anterior cervical decompression/discectomy/fusion (N/A Neck)     Patient location during evaluation: PACU Anesthesia Type: General Level of consciousness: awake and alert Pain management: pain level controlled Vital Signs Assessment: post-procedure vital signs reviewed and stable Respiratory status: spontaneous breathing, nonlabored ventilation, respiratory function stable and patient connected to nasal cannula oxygen Cardiovascular status: blood pressure returned to baseline and stable Postop Assessment: no apparent nausea or vomiting Anesthetic complications: no    Last Vitals:  Vitals:   03/21/19 0434 03/21/19 0809  BP: (!) 148/78 (!) 149/111  Pulse: 76 75  Resp: 18 19  Temp: 36.6 C (!) 36.1 C  SpO2: 97% 99%    Last Pain:  Vitals:   03/21/19 0516  TempSrc:   PainSc: 3                  Samiyyah Moffa

## 2019-03-30 ENCOUNTER — Other Ambulatory Visit: Payer: Self-pay | Admitting: Family Medicine

## 2019-03-30 DIAGNOSIS — E1142 Type 2 diabetes mellitus with diabetic polyneuropathy: Secondary | ICD-10-CM

## 2019-03-30 DIAGNOSIS — Z7952 Long term (current) use of systemic steroids: Secondary | ICD-10-CM

## 2019-03-30 MED FILL — tiZANidine HCL 4 MG TABS: 4 | 7 days supply | Qty: 60 | Fill #1

## 2019-03-30 MED FILL — predniSONE 5 MG TABS: 5 | 30 days supply | Qty: 30 | Fill #0

## 2019-03-30 NOTE — Progress Notes (Signed)
Patient ID: Tony Ortega, male   DOB: 1960/07/29, 59 y.o.   MRN: [id number]   Refill prescription requested for patient to take prednisone 5 mg daily.  On review of chart, I am not sure why he is on this medication chronically.  He is also diabetic but most recent hemoglobin A1c on 03/20/2019 was good at 6.7.  Will refill current prednisone for 30 days with 1 refill but will also refer patient to endocrinology to see if he needs to continue prednisone daily or forward issue and also if the 5 mg dose needs to be tapered/discontinued.

## 2019-04-04 ENCOUNTER — Telehealth: Payer: Self-pay

## 2019-04-04 NOTE — Telephone Encounter (Signed)
Can you find out patient's personal care needs.  Will he need a in office face-to-face appointment regarding PCS services

## 2019-04-04 NOTE — Telephone Encounter (Signed)
Met with the patient when he was in the clinic today.  He was inquiring about assistance with personal care.  Informed him that a referral can be made for PCS if Dr Chapman Fitch is in agreement.

## 2019-04-06 NOTE — Telephone Encounter (Signed)
Call placed to patient to discuss PCS needs. Voicemail full, unable to leave a message. A new face to face is not needed as he was just seen 01/2019 however he has an appt with Dr Chapman Fitch, next week - 04/13/2019 and PCS needs can be discussed at that time.

## 2019-04-10 ENCOUNTER — Other Ambulatory Visit: Payer: Self-pay

## 2019-04-12 ENCOUNTER — Other Ambulatory Visit: Payer: Self-pay

## 2019-04-12 ENCOUNTER — Encounter: Payer: Self-pay | Admitting: Internal Medicine

## 2019-04-12 ENCOUNTER — Ambulatory Visit (INDEPENDENT_AMBULATORY_CARE_PROVIDER_SITE_OTHER): Payer: Medicaid Other | Admitting: Internal Medicine

## 2019-04-12 VITALS — BP 144/82 | HR 80 | Temp 98.2°F | Ht 66.0 in | Wt 139.2 lb

## 2019-04-12 DIAGNOSIS — I10 Essential (primary) hypertension: Secondary | ICD-10-CM

## 2019-04-12 DIAGNOSIS — E1142 Type 2 diabetes mellitus with diabetic polyneuropathy: Secondary | ICD-10-CM

## 2019-04-12 DIAGNOSIS — Z7952 Long term (current) use of systemic steroids: Secondary | ICD-10-CM

## 2019-04-12 LAB — GLUCOSE, POCT (MANUAL RESULT ENTRY): POC Glucose: 203 mg/dl — AB (ref 70–99)

## 2019-04-12 MED ORDER — ACCU-CHEK GUIDE VI STRP: ORAL_STRIP | 12 refills | Status: DC

## 2019-04-12 MED FILL — ACCU-CHEK GUIDE TEST STRIP: 25 days supply | Qty: 100 | Fill #0

## 2019-04-12 NOTE — Patient Instructions (Signed)
-   I would recommend taking Metformin HALF a tablet with the first meal of the day  - Continue to take prednisone with breakfast daily, you will need to see a joint specialist to determine if prednisone is needed or not, as determining prednisone intake for rheumatoid arthritis is beyond the scope of endocrinology

## 2019-04-12 NOTE — Progress Notes (Signed)
Name: Tony Ortega  MRN/ DOB: [id number], December 31, 1960   Age/ Sex: 59 y.o., male    PCP: [name], MD   Reason for Endocrinology Evaluation: Type 2 Diabetes Mellitus     Date of Initial Endocrinology Visit: 04/12/2019     PATIENT IDENTIFIER: Tony Ortega is a 59 y.o. male with a past medical history of HTN, Dyslipidemia, RA and DM. The patient presented for initial endocrinology clinic visit on 04/12/2019 for consultative assistance with his diabetes management.    HPI: Tony Ortega was    Diagnosed with DM 2001 Prior Medications tried/Intolerance: metformin and insulin  Currently checking blood sugars 4-5 x / day Hypoglycemia episodes : yes         Symptoms: shaking                Frequency: multiple / week  Hemoglobin A1c has ranged from 6.7% in 2021, peaking at 6.8% in 2020. Patient required assistance for hypoglycemia: no  Patient has required hospitalization within the last 1 year from hyper or hypoglycemia: no   In terms of diet, the patient eats 3 meals a day , sometimes he would snack . Drinks occasional sweet tea   Has was incarcerated for 30 yrs and was released 2 yrs ago, he was on insulin and metformin at some point, he has not been on insulin in 2 years.   HOME DIABETES REGIMEN: Metformin 500 mg daily - variable intake only if > 200  Insulin- has not taken it in 2 years   Statin: yes ACE-I/ARB: Yes Prior Diabetic Education: No    METER DOWNLOAD SUMMARY: Did not bring    DIABETIC COMPLICATIONS: Microvascular complications:   Neuropathy  Denies:  Retinopathy, CKD   Last eye exam: Completed years ago   Macrovascular complications:    Denies: CAD, PVD, CVA   PAST HISTORY: Past Medical History:  Past Medical History:  Diagnosis Date  . Diabetes mellitus without complication ([place])   . Dyspnea   . Hypertension   . Neuropathy   . Rheumatoid arthritis ([place])   . Stroke [facility])    Past Surgical History:  Past Surgical History:  Procedure  Laterality Date  . ANTERIOR CERVICAL DECOMP/DISCECTOMY FUSION N/A 03/20/2019   Procedure: Cervical four-five Cervical six-seven  Anterior cervical decompression/discectomy/fusion;  Surgeon: Kristeen Miss, MD;  Location: [place];  Service: Neurosurgery;  Laterality: N/A;  . KNEE SURGERY Right       Social History:  reports that he has been smoking cigarettes. He has a 1.20 pack-year smoking history. He has never used smokeless tobacco. He reports current alcohol use of about 10.0 standard drinks of alcohol per week. He reports that he does not use drugs. Family History:  Family History  Problem Relation Age of Onset  . Diabetes Mother   . Hypertension Mother   . Rheum arthritis Mother   . Other Father        unsure - thinks he may have history of cancer  . CAD Neg Hx      HOME MEDICATIONS: Allergies as of 04/12/2019      Reactions   Pork-derived Products Other (See Comments)   No pork products      Medication List       Accurate as of April 12, 2019  8:54 AM. If you have any questions, ask your nurse or doctor.        Accu-Chek FastClix Lancets Misc Use as directed twice daily to test blood sugar.  Accu-Chek Guide test strip Generic drug: glucose blood Use as instructed to check blood sugars up to 4 times per day What changed: additional instructions   Accu-Chek Guide w/Device Kit 1 each by Does not apply route 2 (two) times daily. Use as directed twice daily to test blood sugar. E11.42   amitriptyline 25 MG tablet Commonly known as: ELAVIL Take 1 tablet (25 mg total) by mouth at bedtime.   amLODipine 5 MG tablet Commonly known as: NORVASC Take 1 tablet (5 mg total) by mouth daily. To lower blood pressure What changed: additional instructions   aspirin 325 MG tablet Take 1 tablet (325 mg total) by mouth daily. Eat before taking medication   atorvastatin 20 MG tablet Commonly known as: LIPITOR Take 1 tablet (20 mg total) by mouth daily.   gabapentin 300 MG  capsule Commonly known as: NEURONTIN Take 2 capsules (600 mg total) by mouth 3 (three) times daily. What changed: when to take this   hydrochlorothiazide 25 MG tablet Commonly known as: HYDRODIURIL Take 1 tablet (25 mg total) by mouth daily.   HYDROcodone-acetaminophen 5-325 MG tablet Commonly known as: NORCO/VICODIN Take 1 tablet by mouth every 6 (six) hours as needed for moderate pain or severe pain.   ibuprofen 800 MG tablet Commonly known as: ADVIL Take 1 tablet (800 mg total) by mouth 3 (three) times daily. What changed:   when to take this  reasons to take this   metFORMIN 500 MG tablet Commonly known as: GLUCOPHAGE Take 1 tablet (500 mg total) by mouth daily with breakfast. What changed:   how much to take  when to take this  reasons to take this   methocarbamol 500 MG tablet Commonly known as: Robaxin Take 1 tablet (500 mg total) by mouth 4 (four) times daily.   omeprazole 20 MG capsule Commonly known as: PRILOSEC Take 1 capsule (20 mg total) by mouth daily. To reduce stomach acid   predniSONE 5 MG tablet Commonly known as: DELTASONE TAKE 1 TABLET (5 MG TOTAL) BY MOUTH DAILY WITH BREAKFAST.   tiZANidine 4 MG tablet Commonly known as: Zanaflex Take 1-2 tablets (4-8 mg total) by mouth every 6 (six) hours as needed for muscle spasms. Can cause drowsiness        ALLERGIES: Allergies  Allergen Reactions  . Pork-Derived Products Other (See Comments)    No pork products     REVIEW OF SYSTEMS: A comprehensive ROS was conducted with the patient and is negative except as per HPI and below:  Review of Systems  Gastrointestinal: Negative for diarrhea and nausea.  Genitourinary: Negative for frequency.  Musculoskeletal: Positive for joint pain.  Neurological: Positive for tingling.  Endo/Heme/Allergies: Negative for polydipsia.      OBJECTIVE:   VITAL SIGNS: BP (!) 144/82 (BP Location: Left Arm, Patient Position: Sitting, Cuff Size: Normal)    Pulse 80   Temp 98.2 F (36.8 C)   Ht '5\' 6"'  (1.676 m)   Wt 139 lb 3.2 oz (63.1 kg)   SpO2 98%   BMI 22.47 kg/m    PHYSICAL EXAM:  General: Pt appears well and is in NAD  HEENT:  Eyes: External eye exam normal without stare, lid lag or exophthalmos.  EOM intact.   Neck: General: Supple without adenopathy or carotid bruits. Thyroid: Thyroid size normal.  No goiter or nodules appreciated. No thyroid bruit.  Lungs: Clear with good BS bilat with no rales, rhonchi, or wheezes  Heart: RRR with normal S1 and S2 and no  gallops; no murmurs; no rub  Extremities:  Lower extremities - No pretibial edema.   Skin: Normal texture and temperature to palpation.   Neuro: MS is good with appropriate affect, pt is alert and Ox3    DM foot exam: 04/12/2019  The skin of the feet is intact without sores or ulcerations, toe nails curved and thickened  The pedal pulses are 2+ on right and 2+ on left. The sensation is intact to a screening 5.07, 10 gram monofilament bilaterally   DATA REVIEWED:  Lab Results  Component Value Date   HGBA1C 6.7 (H) 03/20/2019   HGBA1C 6.8 (A) 12/13/2018   HGBA1C 6.0 01/10/2018   Lab Results  Component Value Date   LDLCALC 67 01/10/2018   CREATININE 0.81 03/09/2019           ASSESSMENT / PLAN / RECOMMENDATIONS:   1) Type 2 Diabetes Mellitus, Optimally controlled, With neuropathic complications - Most recent A1c of 6.7 %. Goal A1c < 7.0 %.    - Mr. Popp has been on insulin at some point, but apparently he was having hypoglycemic episodes and has not used it in 2 years.  - He is currently on metformin only, that he uses half a tablet on as needed basis( if BG > 200 mg/dl) but pt states this is followed by hypoglycemia.  - Pt did not bring his meter today. He is on long term prednisone which increased his risk of CHO sensitivity and hence causes post-prandial hyperglycemia - I have encouraged him to start taking the metformin with the first meal of the day , rather  then waiting for his BG's to go > 200 mg/dL. I also explained that metformin is not quick acting and taking it as needed is not the right way to use it.  - Pt urged to have an eye exam    MEDICATIONS:  Metformin 500 mg, HALF a tablet with breakfast daily   EDUCATION / INSTRUCTIONS:  BG monitoring instructions: Patient is instructed to check his blood sugars 2 times a day, fasting and bedtime.  Call [facility] if: BG persistently < 70 or > 300. . I reviewed the Rule of 15 for the treatment of hypoglycemia in detail with the patient. Literature supplied.    2) Chronic prednisone therapy :    - Pt has been on this due to [place] , from the endocrinology strand point  I am unable to take him off of it if as this will predispose him to a flare of RA. Pt will NEED to see Rheumatology to determine the need for continued prednisone therapy or if other alternatives would be offered. This will be deferred to his PCP .      Recommend continuation of current Tx with primary MD and consultative f/u at [facility] in the future if pt's DM control becomes problematic.   F/U PRN   Signed electronically by: Mack Guise, MD  [facility]  [address] [place], [place] [address] Phone: [phone] FAX: [phone]   CC: [name], MD [place] [address] Phone: [phone]  Fax: [phone]    Return to Endocrinology clinic as below: Future Appointments  Date Time Provider [place]  04/13/2019  2:10 PM [name], MD [id number] None

## 2019-04-13 ENCOUNTER — Ambulatory Visit: Payer: Medicaid Other | Admitting: Family Medicine

## 2019-04-14 ENCOUNTER — Encounter: Payer: Self-pay | Admitting: Family Medicine

## 2019-04-14 ENCOUNTER — Ambulatory Visit: Payer: Medicaid Other | Attending: Family Medicine | Admitting: Family Medicine

## 2019-04-14 ENCOUNTER — Other Ambulatory Visit: Payer: Self-pay

## 2019-04-14 VITALS — BP 131/89 | HR 79 | Temp 98.2°F | Resp 18 | Ht 67.0 in | Wt 134.0 lb

## 2019-04-14 DIAGNOSIS — M5416 Radiculopathy, lumbar region: Secondary | ICD-10-CM

## 2019-04-14 DIAGNOSIS — R269 Unspecified abnormalities of gait and mobility: Secondary | ICD-10-CM

## 2019-04-14 DIAGNOSIS — Z79899 Other long term (current) drug therapy: Secondary | ICD-10-CM | POA: Insufficient documentation

## 2019-04-14 DIAGNOSIS — I10 Essential (primary) hypertension: Secondary | ICD-10-CM | POA: Diagnosis not present

## 2019-04-14 DIAGNOSIS — Z7952 Long term (current) use of systemic steroids: Secondary | ICD-10-CM | POA: Insufficient documentation

## 2019-04-14 DIAGNOSIS — Z8673 Personal history of transient ischemic attack (TIA), and cerebral infarction without residual deficits: Secondary | ICD-10-CM | POA: Insufficient documentation

## 2019-04-14 DIAGNOSIS — Z833 Family history of diabetes mellitus: Secondary | ICD-10-CM | POA: Insufficient documentation

## 2019-04-14 DIAGNOSIS — M6281 Muscle weakness (generalized): Secondary | ICD-10-CM | POA: Diagnosis not present

## 2019-04-14 DIAGNOSIS — M792 Neuralgia and neuritis, unspecified: Secondary | ICD-10-CM | POA: Diagnosis not present

## 2019-04-14 DIAGNOSIS — Z981 Arthrodesis status: Secondary | ICD-10-CM

## 2019-04-14 DIAGNOSIS — E1142 Type 2 diabetes mellitus with diabetic polyneuropathy: Secondary | ICD-10-CM

## 2019-04-14 DIAGNOSIS — Z8249 Family history of ischemic heart disease and other diseases of the circulatory system: Secondary | ICD-10-CM | POA: Diagnosis not present

## 2019-04-14 DIAGNOSIS — F17210 Nicotine dependence, cigarettes, uncomplicated: Secondary | ICD-10-CM | POA: Diagnosis not present

## 2019-04-14 DIAGNOSIS — G959 Disease of spinal cord, unspecified: Secondary | ICD-10-CM

## 2019-04-14 DIAGNOSIS — E1169 Type 2 diabetes mellitus with other specified complication: Secondary | ICD-10-CM

## 2019-04-14 DIAGNOSIS — M069 Rheumatoid arthritis, unspecified: Secondary | ICD-10-CM | POA: Insufficient documentation

## 2019-04-14 DIAGNOSIS — G8929 Other chronic pain: Secondary | ICD-10-CM | POA: Diagnosis not present

## 2019-04-14 DIAGNOSIS — N521 Erectile dysfunction due to diseases classified elsewhere: Secondary | ICD-10-CM

## 2019-04-14 DIAGNOSIS — Z8261 Family history of arthritis: Secondary | ICD-10-CM | POA: Diagnosis not present

## 2019-04-14 DIAGNOSIS — Z7984 Long term (current) use of oral hypoglycemic drugs: Secondary | ICD-10-CM | POA: Insufficient documentation

## 2019-04-14 DIAGNOSIS — Z7982 Long term (current) use of aspirin: Secondary | ICD-10-CM | POA: Insufficient documentation

## 2019-04-14 LAB — GLUCOSE, POCT (MANUAL RESULT ENTRY): POC Glucose: 210 mg/dL — AB (ref 70–99)

## 2019-04-14 MED ORDER — SILDENAFIL CITRATE 100 MG PO TABS: 50.0000 mg | ORAL_TABLET | Freq: Every day | ORAL | 11 refills | Status: DC | PRN

## 2019-04-14 NOTE — Progress Notes (Signed)
Subjective:  Patient ID: Tony Ortega, male    DOB: 1960-05-10  Age: 59 y.o. MRN: [id number]  CC: Follow-up (back pain and diabetes)   HPI GAR GLANCE, 59 yo male, with diabetes, hypertension, rheumatoid arthritis and history of CVA who presents in follow-up of chronic medical issues and he additionally has undergone cervical discectomy and fusion surgery in early February to help with issues with chronic neck, back pain and lower extremity weakness.  He reports that he was told that he also needs lumbar spine surgery to help with his chronic low back pain and painful numbness and weakness in the legs however he was told that his insurance company did not approve that procedure.  He continues to follow-up with neurosurgery.  He has had some improvement in neck pain and mild improvement in lower extremity weakness however he continues to have episodes of difficulty walking and uses a cane to help with his balance.  He still has episodes of feeling as if he will have acute leg weakness on top of his chronic leg weakness causing him to feel as if he will fall.  He still cannot walk for any significant distances without having increased pain in his lower back and legs.  He would like a referral to pain management to see if there is any medication or interventions that can help with his chronic pain issues.  His pain is never less than 8 on a 0-to-10 scale and is often at a "15".  He is currently taking gabapentin and a muscle relaxant and was previously on hydrocodone post surgery.  He denies any current issues with postsurgical constipation but did experience this shortly after his surgery.  He currently takes ibuprofen.          He has eaten prior to today's visit.  Blood sugar today was 210 here in the office.  He is currently on Metformin.  He denies any current issues with increased thirst or urinary frequency.  He continues to take his blood pressure medications and reports that his blood pressure  one monitor at home is well controlled usually in the 130s over 80s and sometimes less.  He denies headaches or dizziness related to his blood pressure.  He has had no chest pain or palpitations.  He reports that he has experienced some issues with erectile dysfunction and he wonders if this is related to his blood pressure medication.  He denies any loss or change in  bowel or bladder function.  He is in a committed relationship and has some embarrassment and anxiety regarding sexual performance.  He would like to be prescribed a medication for help with this issue.         Past Medical History:  Diagnosis Date  . Diabetes mellitus without complication ([place])   . Dyspnea   . Hypertension   . Neuropathy   . Rheumatoid arthritis ([place])   . Stroke [facility])     Past Surgical History:  Procedure Laterality Date  . ANTERIOR CERVICAL DECOMP/DISCECTOMY FUSION N/A 03/20/2019   Procedure: Cervical four-five Cervical six-seven  Anterior cervical decompression/discectomy/fusion;  Surgeon: Kristeen Miss, MD;  Location: [facility];  Service: Neurosurgery;  Laterality: N/A;  . KNEE SURGERY Right     Family History  Problem Relation Age of Onset  . Diabetes Mother   . Hypertension Mother   . Rheum arthritis Mother   . Other Father        unsure - thinks he may have history of  cancer  . CAD Neg Hx     Social History   Tobacco Use  . Smoking status: Current Every Day Smoker    Packs/day: 0.10    Years: 12.00    Pack years: 1.20    Types: Cigarettes  . Smokeless tobacco: Never Used  Substance Use Topics  . Alcohol use: Yes    Alcohol/week: 10.0 standard drinks    Types: 10 Cans of beer per week    Comment: weekly    ROS Review of Systems  Constitutional: Positive for fatigue. Negative for chills and fever.  HENT: Negative for sore throat and trouble swallowing (Patient with initial difficulty with swallowing status post surgery but this is improving).   Eyes: Negative for photophobia and visual  disturbance.  Respiratory: Negative for cough and shortness of breath.   Cardiovascular: Negative for chest pain and palpitations.  Gastrointestinal: Negative for abdominal pain, constipation, diarrhea and nausea.  Endocrine: Negative for polydipsia, polyphagia and polyuria.  Genitourinary: Negative for dysuria and frequency.       Erectile dysfunction  Musculoskeletal: Positive for back pain, gait problem, neck pain and neck stiffness.  Neurological: Positive for weakness and numbness. Negative for dizziness and headaches.  Hematological: Negative for adenopathy. Does not bruise/bleed easily.    Objective:   Today's Vitals: BP 131/89 (BP Location: Left Arm, Patient Position: Sitting, Cuff Size: Normal)   Pulse 79   Temp 98.2 F (36.8 C) (Oral)   Resp 18   Ht '5\' 7"'  (1.702 m)   Wt 134 lb (60.8 kg)   SpO2 99%   BMI 20.99 kg/m   Physical Exam Vitals and nursing note reviewed.  Constitutional:      General: He is not in acute distress.    Appearance: Normal appearance.     Comments: Well-nourished well-developed but small framed male wearing face mask as per office COVID-19 precautions who is sitting somewhat awkwardly with his head against the wall, sitting in exam chair.  He has standard cane leaning against the wall nearby.  Neck:     Vascular: No carotid bruit.     Comments: Moderate spasm of the posterior neck/paraspinous musculature and mild spasm of the upper back/trapezius area.  He has some mild tenderness to palpation along the posterior cervical paraspinous muscles of the neck Cardiovascular:     Rate and Rhythm: Normal rate and regular rhythm.  Pulmonary:     Effort: Pulmonary effort is normal.     Breath sounds: Normal breath sounds.  Abdominal:     Palpations: Abdomen is soft.     Tenderness: There is no abdominal tenderness. There is no right CVA tenderness, left CVA tenderness, guarding or rebound.  Musculoskeletal:        General: Tenderness present.      Cervical back: Tenderness present.     Right lower leg: No edema.     Left lower leg: No edema.     Comments: Cervical spine and lumbosacral tenderness to palpation and bilateral positive seated leg raise  Lymphadenopathy:     Cervical: No cervical adenopathy.  Skin:    General: Skin is warm and dry.  Neurological:     General: No focal deficit present.     Mental Status: He is alert and oriented to person, place, and time.  Psychiatric:        Mood and Affect: Mood normal.        Behavior: Behavior normal.     Assessment & Plan:  1. Type 2  diabetes, controlled, with peripheral neuropathy ([place]) Keep scheduled follow-up with endocrinologist.  Glucose at today's visit was elevated postprandially at 210.  He will have microalbumin creatinine ratio at today's visit as well as comprehensive metabolic panel.  He additionally agrees to be referred for his annual diabetic eye exam. - Glucose (CBG) - Microalbumin/Creatinine Ratio, Urine - Ambulatory referral to Ophthalmology - Comprehensive metabolic panel  2. Essential hypertension Blood pressure stable and reasonably controlled and he will continue his current amlodipine and hydrochlorothiazide.    3. Lumbar radiculopathy, chronic 4. Neuropathic pain; 7. Abnormal gait He reports that his neurosurgeon would like for him to have lumbar spine surgery however his insurance did not approve the procedure.  Patient continues to have difficulty with ambulation secondary to leg weakness and due to chronic neuropathic pain.  He has been referred back to neurosurgery to see if approval can be had for the lumbar spine surgery or if there are other interventions which may help with his neuropathic pain, leg weakness and gait abnormality.  Patient will also be contacted by the RN case manager from this office this patient reports difficulty with ADLs due to his leg weakness, chronic pain and increased risk for falls and he is interested in personal care  services. - Ambulatory referral to Neurosurgery - Ambulatory referral to Pain Clinic  5. Cervical myelopathy ([place]); s/p cervical spine fusion 6. Muscle weakness of lower extremity He is status post surgery on 03/20/2019 by Dr. Ellene Route for C4-5 and C5-7 anterior cervical decompression/discectomy/fusion surgery to help with cervical myelopathy.  He reports that he has had some mild improvement in lower extremity weakness status post surgery.  He continues to have chronic issues with burning pain in his lower extremities in addition to some continued weakness.  He request and will be referred to pain clinic for further evaluation and treatment. - Ambulatory referral to Pain Clinic  8. Erectile dysfunction due to type 2 diabetes mellitus ([place]) Prescription provided for patient to try Viagra to help with erectile dysfunction which is likely related to a combination of his chronic medical issues including his diabetes and hypertension but patient was also told to make sure that his neurosurgeon is aware, especially if patient is experiencing any bowel or bladder dysfunction which patient does not feel is happening at this time.  Patient denies any issues with chest pain and has had no diagnosed heart disease.  Blood pressure sterilely stable.  He is aware that if he experiences chest pain, changes in vision or any other concerning symptoms that he should stop the use of Viagra and seek medical attention if needed.  Complete smoking cessation also advised. - sildenafil (VIAGRA) 100 MG tablet; Take 0.5-1 tablets (50-100 mg total) by mouth daily as needed for erectile dysfunction. E11.69 and N52.1  Dispense: 10 tablet; Refill: 11   Outpatient Encounter Medications as of 04/14/2019  Medication Sig  . ACCU-CHEK FASTCLIX LANCETS MISC Use as directed twice daily to test blood sugar.  Marland Kitchen amitriptyline (ELAVIL) 25 MG tablet Take 1 tablet (25 mg total) by mouth at bedtime.  Marland Kitchen amLODipine (NORVASC) 5 MG tablet Take 1  tablet (5 mg total) by mouth daily. To lower blood pressure (Patient taking differently: Take 5 mg by mouth daily. )  . aspirin 325 MG tablet Take 1 tablet (325 mg total) by mouth daily. Eat before taking medication  . atorvastatin (LIPITOR) 20 MG tablet Take 1 tablet (20 mg total) by mouth daily.  . Blood Glucose Monitoring Suppl (ACCU-CHEK  GUIDE) w/Device KIT 1 each by Does not apply route 2 (two) times daily. Use as directed twice daily to test blood sugar. E11.42  . gabapentin (NEURONTIN) 300 MG capsule Take 2 capsules (600 mg total) by mouth 3 (three) times daily. (Patient taking differently: Take 600 mg by mouth 2 (two) times daily. )  . glucose blood (ACCU-CHEK GUIDE) test strip Use as instructed to check blood sugars up to 4 times per day E11.42  . hydrochlorothiazide (HYDRODIURIL) 25 MG tablet Take 1 tablet (25 mg total) by mouth daily.  Marland Kitchen HYDROcodone-acetaminophen (NORCO/VICODIN) 5-325 MG tablet Take 1 tablet by mouth every 6 (six) hours as needed for moderate pain or severe pain.  Marland Kitchen ibuprofen (ADVIL) 800 MG tablet Take 1 tablet (800 mg total) by mouth 3 (three) times daily. (Patient taking differently: Take 800 mg by mouth every 8 (eight) hours as needed for moderate pain. )  . metFORMIN (GLUCOPHAGE) 500 MG tablet Take 1 tablet (500 mg total) by mouth daily with breakfast. (Patient taking differently: Take 250 mg by mouth daily as needed (blood sugar over 200). )  . methocarbamol (ROBAXIN) 500 MG tablet Take 1 tablet (500 mg total) by mouth 4 (four) times daily.  Marland Kitchen omeprazole (PRILOSEC) 20 MG capsule Take 1 capsule (20 mg total) by mouth daily. To reduce stomach acid  . predniSONE (DELTASONE) 5 MG tablet TAKE 1 TABLET (5 MG TOTAL) BY MOUTH DAILY WITH BREAKFAST.  Marland Kitchen tiZANidine (ZANAFLEX) 4 MG tablet Take 1-2 tablets (4-8 mg total) by mouth every 6 (six) hours as needed for muscle spasms. Can cause drowsiness   No facility-administered encounter medications on file as of 04/14/2019.    An  After Visit Summary was printed and given to the patient.   Follow-up: Return in about 4 months (around 08/14/2019) for chronic issues; keep f/u with endocrinology.  More than 30 minutes in face-to-face time was spent with the.patient at today's visit including obtaining his current medical history/current issues, review of systems, performance of examination, discussion of assessment of his current issues as well as treatment plan discussion.  All questions were answered to patient satisfaction.  Additional time of approximately 15 minutes was spent reviewing patient's recent surgical notes, labs, and outside specialty notes and creation of today's visit note.  [name] MD

## 2019-04-15 LAB — COMPREHENSIVE METABOLIC PANEL WITH GFR
ALT: 48 IU/L — ABNORMAL HIGH (ref 0–44)
AST: 67 IU/L — ABNORMAL HIGH (ref 0–40)
Albumin/Globulin Ratio: 1.3 (ref 1.2–2.2)
Albumin: 4.4 g/dL (ref 3.8–4.9)
Alkaline Phosphatase: 129 IU/L — ABNORMAL HIGH (ref 39–117)
BUN/Creatinine Ratio: 9 — ABNORMAL LOW (ref 10–24)
BUN: 8 mg/dL (ref 8–27)
Bilirubin Total: 0.8 mg/dL (ref 0.0–1.2)
CO2: 24 mmol/L (ref 20–29)
Calcium: 9.8 mg/dL (ref 8.6–10.2)
Chloride: 99 mmol/L (ref 96–106)
Creatinine, Ser: 0.91 mg/dL (ref 0.76–1.27)
GFR calc Af Amer: 106 mL/min/1.73
GFR calc non Af Amer: 91 mL/min/1.73
Globulin, Total: 3.3 g/dL (ref 1.5–4.5)
Glucose: 162 mg/dL — ABNORMAL HIGH (ref 65–99)
Potassium: 3.9 mmol/L (ref 3.5–5.2)
Sodium: 140 mmol/L (ref 134–144)
Total Protein: 7.7 g/dL (ref 6.0–8.5)

## 2019-04-15 LAB — MICROALBUMIN / CREATININE URINE RATIO
Creatinine, Urine: 240.7 mg/dL
Microalb/Creat Ratio: 19 mg/g{creat} (ref 0–29)
Microalbumin, Urine: 45.5 ug/mL

## 2019-04-17 NOTE — Telephone Encounter (Signed)
Call placed to the patient regarding PCS. He had appointment with Dr Chapman Fitch on 04/14/2019.  He explained that he needs help but could not confirm exactly what help - personal care and/or housekeeping.  He said that he has Peggyann Shoals, RN helping him and he does not want anyone else coming into his home.   This CM explained that the [facility] will do an assessment after a referral is placed and if approved he will need to chose a provider from the list of providers. He is not given money to hire his own caregiver.  He said that he would speak to Janett Billow about this and have her call this CM  He provided authorization for this CM to discuss his care needs with Janett Billow

## 2019-08-16 ENCOUNTER — Ambulatory Visit: Payer: Medicaid Other | Admitting: Family Medicine

## 2019-11-08 MED FILL — predniSONE 5 MG TABS: 5 | 30 days supply | Qty: 30 | Fill #1

## 2019-11-20 MED FILL — predniSONE 5 MG TABS: 5 | 30 days supply | Qty: 30 | Fill #1

## 2020-01-02 MED FILL — OMEPRAZOLE 20 MG CAP: 20 | 90 days supply | Qty: 90 | Fill #1

## 2020-01-02 MED FILL — HYDROCHLOROTHIAZIDE 25 MG T: 25 | 90 days supply | Qty: 90 | Fill #1

## 2020-01-18 ENCOUNTER — Other Ambulatory Visit: Payer: Self-pay | Admitting: Family Medicine

## 2020-01-18 DIAGNOSIS — I10 Essential (primary) hypertension: Secondary | ICD-10-CM

## 2020-01-18 MED ORDER — AMLODIPINE BESYLATE 5 MG PO TABS: 5.0000 mg | ORAL_TABLET | Freq: Every day | ORAL | 1 refills | Status: DC

## 2020-01-18 MED FILL — AMLODIPINE BESYLATE 5 MG TA: 5 | 30 days supply | Qty: 30 | Fill #0

## 2020-01-18 NOTE — Telephone Encounter (Signed)
Requested medication (s) are due for refill today:   Not sure  provider to determine  Requested medication (s) are on the active medication list:   Yes  Future visit scheduled:   No  Last seen by Dr. Chapman Fitch   Last ordered: Norvasc 06/22/2018 #30, 6 refills.                 Vocodin 03/21/2019 #30, 0 refills filled by Dr. Kristeen Miss plus it's a non delegated refill   Requested Prescriptions  Pending Prescriptions Disp Refills   amLODipine (NORVASC) 5 MG tablet 30 tablet 6    Sig: Take 1 tablet (5 mg total) by mouth daily. To lower blood pressure      Cardiovascular:  Calcium Channel Blockers Failed - 01/18/2020  8:23 AM      Failed - Valid encounter within last 6 months    Recent Outpatient Visits           9 months ago Type 2 diabetes, controlled, with peripheral neuropathy ([place])   [place] Fulp, [place], MD   11 months ago Type 2 diabetes, controlled, with peripheral neuropathy ([place])   [place] Fulp, [place], MD   1 year ago Cervical myelopathy [facility])   [place] Elsie Stain, MD   1 year ago Rheumatoid arthritis involving multiple sites, unspecified rheumatoid factor presence ([place])   [place] Fulp, [place], MD   1 year ago Generalized abdominal pain   [place] Ladell Pier, MD                Passed - Last BP in normal range    BP Readings from Last 1 Encounters:  04/14/19 131/89            HYDROcodone-acetaminophen (NORCO/VICODIN) 5-325 MG tablet 30 tablet 0    Sig: Take 1 tablet by mouth every 6 (six) hours as needed for moderate pain or severe pain.      Not Delegated - Analgesics:  Opioid Agonist Combinations Failed - 01/18/2020  8:23 AM      Failed - This refill cannot be delegated      Failed - Urine Drug Screen completed in last 360 days      Failed - Valid encounter within last 6 months     Recent Outpatient Visits           9 months ago Type 2 diabetes, controlled, with peripheral neuropathy ([place])   [place] Fulp, [place], MD   11 months ago Type 2 diabetes, controlled, with peripheral neuropathy ([place])   [place] Fulp, [place], MD   1 year ago Cervical myelopathy [facility])   [place], Patrick E, MD   1 year ago Rheumatoid arthritis involving multiple sites, unspecified rheumatoid factor presence ([place])   [place] Fulp, [place], MD   1 year ago Generalized abdominal pain   [place] [facility] Ladell Pier, MD

## 2020-01-18 NOTE — Telephone Encounter (Signed)
Medication:  amLODipine (NORVASC) 5 MG tablet [[id number]]  HYDROcodone-acetaminophen (NORCO/VICODIN) 5-325 MG tablet [[id number]]   Has the patient contacted their pharmacy? No    Preferred Pharmacy (with phone number or street name):  [facility], [address]  [place], [place] [address]  Phone:  [phone] Fax:  [phone]  Agent: Please be advised that RX refills may take up to 3 business days. We ask that you follow-up with your pharmacy

## 2020-02-12 ENCOUNTER — Other Ambulatory Visit: Payer: Self-pay | Admitting: Family Medicine

## 2020-02-12 DIAGNOSIS — I10 Essential (primary) hypertension: Secondary | ICD-10-CM

## 2020-02-12 MED FILL — AMLODIPINE BESYLATE 5 MG TA: 5 | 30 days supply | Qty: 30 | Fill #0

## 2020-02-12 NOTE — Telephone Encounter (Signed)
Requested medication (s) are due for refill today: Yes  Requested medication (s) are on the active medication list: Yes  Last refill:  02/01/19 and 03/2019  Future visit scheduled: Yes  Notes to clinic:  Unable to refill per protocol, last refill by another provider and unable to delegate     Requested Prescriptions  Pending Prescriptions Disp Refills   hydrochlorothiazide (HYDRODIURIL) 25 MG tablet [Pharmacy Med Name: HYDROCHLOROTHIAZIDE 25 MG T 25 Tablet] 90 tablet 1    Sig: TAKE 1 TABLET (25 MG TOTAL) BY MOUTH DAILY.      Cardiovascular: Diuretics - Thiazide Failed - 02/12/2020 10:56 AM      Failed - Valid encounter within last 6 months    Recent Outpatient Visits           10 months ago Type 2 diabetes, controlled, with peripheral neuropathy ([facility])   [facility] Fulp, [place], MD   1 year ago Type 2 diabetes, controlled, with peripheral neuropathy ([facility])   [place] [facility] Fulp, [place], MD   1 year ago Cervical myelopathy [facility])   [place] [facility] Storm Frisk, MD   1 year ago Rheumatoid arthritis involving multiple sites, unspecified rheumatoid factor presence ([facility])   [place] [facility] Fulp, [place], MD   1 year ago Generalized abdominal pain   [place] [facility] Marcine Matar, MD       Future Appointments             In 3 weeks Claiborne Rigg, NP [facility]             Passed - Ca in normal range and within 360 days    Calcium  Date Value Ref Range Status  04/14/2019 9.8 8.6 - 10.2 mg/dL Final          Passed - Cr in normal range and within 360 days    Creatinine, Ser  Date Value Ref Range Status  04/14/2019 0.91 0.76 - 1.27 mg/dL Final          Passed - K in normal range and within 360 days    Potassium  Date Value Ref Range Status  04/14/2019 3.9 3.5 - 5.2 mmol/L Final           Passed - Na in normal range and within 360 days    Sodium  Date Value Ref Range Status  04/14/2019 140 134 - 144 mmol/L Final          Passed - Last BP in normal range    BP Readings from Last 1 Encounters:  04/14/19 131/89            predniSONE (DELTASONE) 5 MG tablet [Pharmacy Med Name: predniSONE 5 MG TABS 5 Tablet] 30 tablet 1    Sig: TAKE 1 TABLET (5 MG TOTAL) BY MOUTH DAILY WITH BREAKFAST.      Not Delegated - Endocrinology:  Oral Corticosteroids Failed - 02/12/2020 10:56 AM      Failed - This refill cannot be delegated      Failed - Valid encounter within last 6 months    Recent Outpatient Visits           10 months ago Type 2 diabetes, controlled, with peripheral neuropathy ([facility])    [facility] Fulp, [place], MD   1 year ago Type 2 diabetes, controlled, with peripheral neuropathy ([facility])  [place] Fulp, [place], MD   1 year ago Cervical myelopathy [facility])   [place] Elsie Stain, MD   1 year ago Rheumatoid arthritis involving multiple sites, unspecified rheumatoid factor presence [facility])   [place] [name], MD   1 year ago Generalized abdominal pain   [place], MD       Future Appointments             In 3 weeks Gildardo Pounds, NP [place] BP in normal range    BP Readings from Last 1 Encounters:  04/14/19 131/89

## 2020-02-14 ENCOUNTER — Other Ambulatory Visit: Payer: Self-pay | Admitting: Family Medicine

## 2020-02-14 MED FILL — AMLODIPINE BESYLATE 5 MG TA: 5 | 30 days supply | Qty: 30 | Fill #1

## 2020-02-14 MED FILL — HYDROCHLOROTHIAZIDE 25 MG T: 25 | 30 days supply | Qty: 30 | Fill #0

## 2020-02-14 NOTE — Telephone Encounter (Signed)
Copied from CRM [id number]. Topic: General - Inquiry >> Feb 14, 2020  8:18 AM Adrian Prince D wrote: Reason for [id number] has an appointment set for 03/08/20, but he is completely out of his medications and would like a refill to hold him until his appointment. He can be reached at [phone]. Please advise

## 2020-02-20 MED FILL — HYDROCHLOROTHIAZIDE 25 MG T: 25 | 30 days supply | Qty: 30 | Fill #0

## 2020-03-08 ENCOUNTER — Ambulatory Visit: Payer: Medicaid Other | Attending: Nurse Practitioner | Admitting: Nurse Practitioner

## 2020-03-08 ENCOUNTER — Other Ambulatory Visit: Payer: Self-pay

## 2020-03-08 ENCOUNTER — Encounter: Payer: Self-pay | Admitting: Nurse Practitioner

## 2020-03-08 DIAGNOSIS — Z7689 Persons encountering health services in other specified circumstances: Secondary | ICD-10-CM | POA: Diagnosis not present

## 2020-03-08 DIAGNOSIS — E1142 Type 2 diabetes mellitus with diabetic polyneuropathy: Secondary | ICD-10-CM

## 2020-03-08 DIAGNOSIS — E785 Hyperlipidemia, unspecified: Secondary | ICD-10-CM | POA: Diagnosis not present

## 2020-03-08 DIAGNOSIS — I10 Essential (primary) hypertension: Secondary | ICD-10-CM

## 2020-03-08 NOTE — Progress Notes (Signed)
Virtual Visit via Telephone Note Due to national recommendations of social distancing due to [place] 19, telehealth visit is felt to be most appropriate for this patient at this time.  I discussed the limitations, risks, security and privacy concerns of performing an evaluation and management service by telephone and the availability of in person appointments. I also discussed with the patient that there may be a patient responsible charge related to this service. The patient expressed understanding and agreed to proceed.    I connected with Tony Ortega on 03/08/20  at   3:10 PM EST  EDT by telephone and verified that I am speaking with the correct person using two identifiers.   Consent I discussed the limitations, risks, security and privacy concerns of performing an evaluation and management service by telephone and the availability of in person appointments. I also discussed with the patient that there may be a patient responsible charge related to this service. The patient expressed understanding and agreed to proceed.   Location of Patient: Private Residence    Location of Provider: [place] [facility] [employer] Office    Persons participating in Telemedicine visit: Geryl Rankins FNP-BC [place]    History of Present Illness: Telemedicine visit for: Establish Care He has a past medical history of Diabetes mellitus without complication, Dyspnea, Hypertension, Neuropathy, Rheumatoid arthritis, and Stroke.   DM 2 He does not monitor his blood glucose levels daily. He takes metformin "every now and then". He has complaints of neuropathy however he is not taking gabapentin 600 mg TID as prescribed.  Lab Results  Component Value Date   HGBA1C 6.7 (H) 03/20/2019   Lab Results  Component Value Date   LDLCALC 67 01/10/2018   Essential Hypertension Last home blood pressure reading:  125/65. He is taking amlodipine 5 mg daily, HCTZ 25 mg daily,  BP  Readings from Last 3 Encounters:  04/14/19 131/89  04/12/19 (!) 144/82  03/21/19 (!) 149/111   Erectile dysfunction Requesting medication for ED however he never took viagra which had been prescribed for him. States he was scared to take it because he heard it causes heart attacks.   Past Medical History:  Diagnosis Date  . Diabetes mellitus without complication ([place])   . Dyspnea   . Hypertension   . Neuropathy   . Rheumatoid arthritis ([place])   . Stroke [facility])     Past Surgical History:  Procedure Laterality Date  . ANTERIOR CERVICAL DECOMP/DISCECTOMY FUSION N/A 03/20/2019   Procedure: Cervical four-five Cervical six-seven  Anterior cervical decompression/discectomy/fusion;  Surgeon: Kristeen Miss, MD;  Location: [place];  Service: Neurosurgery;  Laterality: N/A;  . KNEE SURGERY Right     Family History  Problem Relation Age of Onset  . Diabetes Mother   . Hypertension Mother   . Rheum arthritis Mother   . Other Father        unsure - thinks he may have history of cancer  . CAD Neg Hx     Social History   Socioeconomic History  . Marital status: Married    Spouse name: Not on file  . Number of children: 2  . Years of education: 79  . Highest education level: Not on file  Occupational History  . Occupation: Disabled  Tobacco Use  . Smoking status: Current Every Day Smoker    Packs/day: 0.10    Years: 12.00    Pack years: 1.20    Types: Cigarettes  . Smokeless tobacco: Never  Used  Vaping Use  . Vaping Use: Never used  Substance and Sexual Activity  . Alcohol use: Yes    Alcohol/week: 10.0 standard drinks    Types: 10 Cans of beer per week    Comment: weekly  . Drug use: Never  . Sexual activity: Not Currently  Other Topics Concern  . Not on file  Social History Narrative   Lives at home with mother.   Left-handed.   He released was released from prison in March 2019.  He served 32 years.   2 cups caffeine per day.      Social Determinants of Health    Financial Resource Strain: Not on file  Food Insecurity: Not on file  Transportation Needs: Not on file  Physical Activity: Not on file  Stress: Not on file  Social Connections: Not on file     Observations/Objective: Awake, alert and oriented x 3   Review of Systems  Constitutional: Negative for fever, malaise/fatigue and weight loss.  HENT: Negative.  Negative for nosebleeds.   Eyes: Negative.  Negative for blurred vision, double vision and photophobia.  Respiratory: Negative.  Negative for cough and shortness of breath.   Cardiovascular: Negative.  Negative for chest pain, palpitations and leg swelling.  Gastrointestinal: Negative.  Negative for heartburn, nausea and vomiting.  Genitourinary:       ED  Musculoskeletal: Negative.  Negative for myalgias.  Neurological: Negative.  Negative for dizziness, focal weakness, seizures and headaches.  Psychiatric/Behavioral: Negative.  Negative for suicidal ideas.    Assessment and Plan: Kendry was seen today for establish care.  Diagnoses and all orders for this visit:  Encounter to establish care  Essential hypertension Continue all antihypertensives as prescribed.  Remember to bring in your blood pressure log with you for your follow up appointment.  DASH/Mediterranean Diets are healthier choices for HTN.    Type 2 diabetes, controlled, with peripheral neuropathy (HCC) Continue blood sugar control as discussed in office today, low carbohydrate diet, and regular physical exercise as tolerated, 150 minutes per week (30 min each day, 5 days per week, or 50 min 3 days per week). Keep blood sugar logs with fasting goal of 90-130 mg/dl, post prandial (after you eat) less than 180.  For Hypoglycemia: BS <60 and Hyperglycemia BS >400; contact the clinic ASAP. Annual eye exams and foot exams are recommended.   Dyslipidemia, goal LDL below 70 INSTRUCTIONS: Work on a low fat, heart healthy diet and participate in regular aerobic  exercise program by working out at least 150 minutes per week; 5 days a week-30 minutes per day. Avoid red meat/beef/steak,  fried foods. junk foods, sodas, sugary drinks, unhealthy snacking, alcohol and smoking.  Drink at least 80 oz of water per day and monitor your carbohydrate intake daily.     Follow Up Instructions Return in about 3 months (around 06/06/2020).     I discussed the assessment and treatment plan with the patient. The patient was provided an opportunity to ask questions and all were answered. The patient agreed with the plan and demonstrated an understanding of the instructions.   The patient was advised to call back or seek an in-person evaluation if the symptoms worsen or if the condition fails to improve as anticipated.  I provided 12 minutes of non-face-to-face time during this encounter including median intraservice time, reviewing previous notes, labs, imaging, medications and explaining diagnosis and management.  Gildardo Pounds, FNP-BC

## 2020-03-11 ENCOUNTER — Other Ambulatory Visit: Payer: Self-pay | Admitting: Nurse Practitioner

## 2020-03-11 ENCOUNTER — Other Ambulatory Visit: Payer: Medicaid Other

## 2020-03-11 DIAGNOSIS — E1169 Type 2 diabetes mellitus with other specified complication: Secondary | ICD-10-CM

## 2020-03-11 DIAGNOSIS — Z79899 Other long term (current) drug therapy: Secondary | ICD-10-CM

## 2020-03-11 DIAGNOSIS — E1142 Type 2 diabetes mellitus with diabetic polyneuropathy: Secondary | ICD-10-CM

## 2020-03-11 DIAGNOSIS — Z1211 Encounter for screening for malignant neoplasm of colon: Secondary | ICD-10-CM

## 2020-03-11 DIAGNOSIS — E785 Hyperlipidemia, unspecified: Secondary | ICD-10-CM

## 2020-03-11 DIAGNOSIS — Z125 Encounter for screening for malignant neoplasm of prostate: Secondary | ICD-10-CM

## 2020-03-11 DIAGNOSIS — Z1159 Encounter for screening for other viral diseases: Secondary | ICD-10-CM

## 2020-03-11 DIAGNOSIS — Z114 Encounter for screening for human immunodeficiency virus [HIV]: Secondary | ICD-10-CM

## 2020-03-13 ENCOUNTER — Encounter: Payer: Self-pay | Admitting: Nurse Practitioner

## 2020-03-13 ENCOUNTER — Ambulatory Visit: Payer: Medicaid Other | Attending: Nurse Practitioner

## 2020-03-13 ENCOUNTER — Other Ambulatory Visit: Payer: Self-pay

## 2020-03-13 DIAGNOSIS — Z79899 Other long term (current) drug therapy: Secondary | ICD-10-CM

## 2020-03-13 DIAGNOSIS — E1169 Type 2 diabetes mellitus with other specified complication: Secondary | ICD-10-CM

## 2020-03-13 DIAGNOSIS — Z125 Encounter for screening for malignant neoplasm of prostate: Secondary | ICD-10-CM

## 2020-03-13 DIAGNOSIS — Z1159 Encounter for screening for other viral diseases: Secondary | ICD-10-CM

## 2020-03-13 DIAGNOSIS — Z114 Encounter for screening for human immunodeficiency virus [HIV]: Secondary | ICD-10-CM

## 2020-03-13 DIAGNOSIS — E1142 Type 2 diabetes mellitus with diabetic polyneuropathy: Secondary | ICD-10-CM

## 2020-03-13 DIAGNOSIS — E785 Hyperlipidemia, unspecified: Secondary | ICD-10-CM

## 2020-03-14 ENCOUNTER — Other Ambulatory Visit: Payer: Self-pay | Admitting: Nurse Practitioner

## 2020-03-20 LAB — TSH: TSH: 0.672 u[IU]/mL (ref 0.450–4.500)

## 2020-03-20 LAB — CMP14+EGFR
ALT: 38 IU/L (ref 0–44)
AST: 47 IU/L — ABNORMAL HIGH (ref 0–40)
Albumin/Globulin Ratio: 1.2 (ref 1.2–2.2)
Albumin: 4.4 g/dL (ref 3.8–4.8)
Alkaline Phosphatase: 102 IU/L (ref 44–121)
BUN/Creatinine Ratio: 12 (ref 10–24)
BUN: 11 mg/dL (ref 8–27)
Bilirubin Total: 0.5 mg/dL (ref 0.0–1.2)
CO2: 24 mmol/L (ref 20–29)
Calcium: 9.9 mg/dL (ref 8.6–10.2)
Chloride: 99 mmol/L (ref 96–106)
Creatinine, Ser: 0.92 mg/dL (ref 0.76–1.27)
GFR calc Af Amer: 103 mL/min/{1.73_m2} (ref >59)
GFR calc non Af Amer: 89 mL/min/{1.73_m2} (ref >59)
Globulin, Total: 3.6 g/dL (ref 1.5–4.5)
Glucose: 120 mg/dL — ABNORMAL HIGH (ref 65–99)
Potassium: 4 mmol/L (ref 3.5–5.2)
Sodium: 137 mmol/L (ref 134–144)
Total Protein: 8 g/dL (ref 6.0–8.5)

## 2020-03-20 LAB — HCV AB W REFLEX TO QUANT PCR: HCV Ab: >11 s/co ratio — ABNORMAL HIGH (ref 0.0–0.9)

## 2020-03-20 LAB — LIPID PANEL
Chol/HDL Ratio: 2.5 ratio (ref 0.0–5.0)
Cholesterol, Total: 155 mg/dL (ref 100–199)
HDL: 63 mg/dL (ref >39)
LDL Chol Calc (NIH): 74 mg/dL (ref 0–99)
Triglycerides: 100 mg/dL (ref 0–149)
VLDL Cholesterol Cal: 18 mg/dL (ref 5–40)

## 2020-03-20 LAB — CBC
Hematocrit: 47.6 % (ref 37.5–51.0)
Hemoglobin: 16.8 g/dL (ref 13.0–17.7)
MCH: 32.2 pg (ref 26.6–33.0)
MCHC: 35.3 g/dL (ref 31.5–35.7)
MCV: 91 fL (ref 79–97)
Platelets: 252 10*3/uL (ref 150–450)
RBC: 5.21 x10E6/uL (ref 4.14–5.80)
RDW: 11.6 % (ref 11.6–15.4)
WBC: 7.6 10*3/uL (ref 3.4–10.8)

## 2020-03-20 LAB — HCV RT-PCR, QUANT (NON-GRAPH)
HCV log10: 6.449 log10 IU/mL
Hepatitis C Quantitation: 2810000 IU/mL

## 2020-03-20 LAB — HIV ANTIBODY (ROUTINE TESTING W REFLEX): HIV Screen 4th Generation wRfx: NONREACTIVE

## 2020-03-20 LAB — HEMOGLOBIN A1C
Est. average glucose Bld gHb Est-mCnc: 131 mg/dL
Hgb A1c MFr Bld: 6.2 % — ABNORMAL HIGH (ref 4.8–5.6)

## 2020-03-20 LAB — PSA: Prostate Specific Ag, Serum: 1.3 ng/mL (ref 0.0–4.0)

## 2020-03-21 ENCOUNTER — Telehealth: Payer: Self-pay | Admitting: Nurse Practitioner

## 2020-03-21 NOTE — Telephone Encounter (Signed)
Patient came by to get his refills from his appt on 1/28. He is requesting a refill on all medication. Please advise.

## 2020-03-23 ENCOUNTER — Other Ambulatory Visit: Payer: Self-pay | Admitting: Nurse Practitioner

## 2020-03-23 DIAGNOSIS — Z7982 Long term (current) use of aspirin: Secondary | ICD-10-CM

## 2020-03-23 DIAGNOSIS — Z8673 Personal history of transient ischemic attack (TIA), and cerebral infarction without residual deficits: Secondary | ICD-10-CM

## 2020-03-23 DIAGNOSIS — I10 Essential (primary) hypertension: Secondary | ICD-10-CM

## 2020-03-23 DIAGNOSIS — M5416 Radiculopathy, lumbar region: Secondary | ICD-10-CM

## 2020-03-23 DIAGNOSIS — N521 Erectile dysfunction due to diseases classified elsewhere: Secondary | ICD-10-CM

## 2020-03-23 DIAGNOSIS — E1142 Type 2 diabetes mellitus with diabetic polyneuropathy: Secondary | ICD-10-CM

## 2020-03-23 DIAGNOSIS — G959 Disease of spinal cord, unspecified: Secondary | ICD-10-CM

## 2020-03-23 DIAGNOSIS — E1169 Type 2 diabetes mellitus with other specified complication: Secondary | ICD-10-CM

## 2020-03-23 DIAGNOSIS — K219 Gastro-esophageal reflux disease without esophagitis: Secondary | ICD-10-CM

## 2020-03-23 MED ORDER — GABAPENTIN 300 MG PO CAPS: 600.0000 mg | ORAL_CAPSULE | Freq: Three times a day (TID) | ORAL | 11 refills | Status: DC

## 2020-03-23 MED ORDER — METFORMIN HCL 500 MG PO TABS: 500.0000 mg | ORAL_TABLET | Freq: Every day | ORAL | 1 refills | Status: DC

## 2020-03-23 MED ORDER — AMLODIPINE BESYLATE 5 MG PO TABS: 5.0000 mg | ORAL_TABLET | Freq: Every day | ORAL | 0 refills | Status: DC

## 2020-03-23 MED ORDER — SILDENAFIL CITRATE 100 MG PO TABS: 50.0000 mg | ORAL_TABLET | Freq: Every day | ORAL | 11 refills | Status: DC | PRN

## 2020-03-23 MED ORDER — OMEPRAZOLE 20 MG PO CPDR: 20.0000 mg | DELAYED_RELEASE_CAPSULE | Freq: Every day | ORAL | 4 refills | Status: DC

## 2020-03-23 MED ORDER — ASPIRIN 325 MG PO TABS: 325.0000 mg | ORAL_TABLET | Freq: Every day | ORAL | 3 refills | Status: DC

## 2020-03-23 MED ORDER — ATORVASTATIN CALCIUM 20 MG PO TABS: 20.0000 mg | ORAL_TABLET | Freq: Every day | ORAL | 1 refills | Status: DC

## 2020-03-23 MED ORDER — ACCU-CHEK GUIDE VI STRP
ORAL_STRIP | 12 refills | Status: DC
Start: 2020-03-23 — End: 2020-03-23

## 2020-03-23 MED ORDER — HYDROCHLOROTHIAZIDE 25 MG PO TABS: 25.0000 mg | ORAL_TABLET | Freq: Every day | ORAL | 0 refills | Status: DC

## 2020-03-25 MED FILL — ATORVASTATIN CALCIUM 20 MG: 20 | 30 days supply | Qty: 30 | Fill #0

## 2020-03-25 MED FILL — HYDROCHLOROTHIAZIDE 25 MG T: 25 | 30 days supply | Qty: 30 | Fill #0

## 2020-03-25 MED FILL — OMEPRAZOLE 20 MG CAP: 20 | 30 days supply | Qty: 30 | Fill #0

## 2020-03-25 MED FILL — METFORMIN HCL 500 MG TABS: 500 | 30 days supply | Qty: 30 | Fill #0

## 2020-03-25 MED FILL — SILDENAFIL CITRATE 100 MG T: 100 | 30 days supply | Qty: 10 | Fill #0

## 2020-03-25 MED FILL — AMLODIPINE BESYLATE 5 MG TA: 5 | 30 days supply | Qty: 30 | Fill #0

## 2020-03-25 MED FILL — GABAPENTIN 300 MG CAPSULE: 300 | 30 days supply | Qty: 180 | Fill #0

## 2020-03-28 ENCOUNTER — Encounter: Payer: Self-pay | Admitting: Gastroenterology

## 2020-04-08 ENCOUNTER — Other Ambulatory Visit: Payer: Self-pay | Admitting: Gastroenterology

## 2020-04-08 ENCOUNTER — Ambulatory Visit (AMBULATORY_SURGERY_CENTER): Payer: Self-pay | Admitting: *Deleted

## 2020-04-08 ENCOUNTER — Other Ambulatory Visit: Payer: Self-pay

## 2020-04-08 VITALS — Ht 67.0 in | Wt 133.0 lb

## 2020-04-08 DIAGNOSIS — Z1211 Encounter for screening for malignant neoplasm of colon: Secondary | ICD-10-CM

## 2020-04-08 MED ORDER — NA SULFATE-K SULFATE-MG SULF 17.5-3.13-1.6 GM/177ML PO SOLN: ORAL | 0 refills | Status: DC

## 2020-04-08 MED FILL — SUPREP BOWEL PREP KIT: 17.5-3.13-1 | 30 days supply | Qty: 354 | Fill #0

## 2020-04-08 NOTE — Progress Notes (Signed)
Patient is here in-person for PV. Patient denies any allergies to eggs or soy. Patient denies any problems with anesthesia/sedation. Patient denies any oxygen use at home. Patient denies taking any diet/weight loss medications or blood thinners. Patient is not being treated for MRSA or C-diff. Patient is aware of our care-partner policy and MMITV-47 safety protocol.

## 2020-04-11 MED FILL — SILDENAFIL CITRATE 100 MG T: 100 | 30 days supply | Qty: 10 | Fill #0

## 2020-04-11 MED FILL — ATORVASTATIN CALCIUM 20 MG: 20 | 30 days supply | Qty: 30 | Fill #0

## 2020-04-11 MED FILL — AMLODIPINE BESYLATE 5 MG TA: 5 | 30 days supply | Qty: 30 | Fill #0

## 2020-04-11 MED FILL — OMEPRAZOLE 20 MG CAP: 20 | 30 days supply | Qty: 30 | Fill #0

## 2020-04-11 MED FILL — METFORMIN HCL 500 MG TABS: 500 | 30 days supply | Qty: 30 | Fill #0

## 2020-04-11 MED FILL — GABAPENTIN 300 MG CAPSULE: 300 | 30 days supply | Qty: 180 | Fill #0

## 2020-04-11 MED FILL — HYDROCHLOROTHIAZIDE 25 MG T: 25 | 30 days supply | Qty: 30 | Fill #0

## 2020-04-12 ENCOUNTER — Telehealth: Payer: Self-pay | Admitting: Nurse Practitioner

## 2020-04-12 NOTE — Telephone Encounter (Signed)
Pt called in to request a referral back to the "foot Dr" not sure if pt is referring to ortho or podiatrist. Pt would like further assistance.

## 2020-04-15 ENCOUNTER — Telehealth: Payer: Self-pay | Admitting: Gastroenterology

## 2020-04-15 ENCOUNTER — Other Ambulatory Visit: Payer: Self-pay | Admitting: Gastroenterology

## 2020-04-15 ENCOUNTER — Ambulatory Visit: Payer: Medicaid Other | Attending: Nurse Practitioner | Admitting: Nurse Practitioner

## 2020-04-15 ENCOUNTER — Other Ambulatory Visit: Payer: Self-pay

## 2020-04-15 ENCOUNTER — Encounter: Payer: Self-pay | Admitting: Nurse Practitioner

## 2020-04-15 VITALS — BP 147/90 | HR 66 | Resp 16 | Wt 129.4 lb

## 2020-04-15 DIAGNOSIS — Z79899 Other long term (current) drug therapy: Secondary | ICD-10-CM | POA: Diagnosis not present

## 2020-04-15 DIAGNOSIS — E1142 Type 2 diabetes mellitus with diabetic polyneuropathy: Secondary | ICD-10-CM | POA: Diagnosis not present

## 2020-04-15 DIAGNOSIS — I10 Essential (primary) hypertension: Secondary | ICD-10-CM

## 2020-04-15 DIAGNOSIS — Z1211 Encounter for screening for malignant neoplasm of colon: Secondary | ICD-10-CM

## 2020-04-15 DIAGNOSIS — Z7982 Long term (current) use of aspirin: Secondary | ICD-10-CM | POA: Diagnosis not present

## 2020-04-15 DIAGNOSIS — Z7984 Long term (current) use of oral hypoglycemic drugs: Secondary | ICD-10-CM | POA: Insufficient documentation

## 2020-04-15 MED ORDER — TADALAFIL 20 MG PO TABS
10.0000 mg | ORAL_TABLET | Freq: Every day | ORAL | 12 refills | Status: DC | PRN
Start: 2020-04-15 — End: 2020-04-16

## 2020-04-15 MED ORDER — AMLODIPINE BESYLATE 10 MG PO TABS: 10.0000 mg | ORAL_TABLET | Freq: Every day | ORAL | 0 refills | Status: DC

## 2020-04-15 MED ORDER — BISACODYL EC 5 MG PO TBEC: 5.0000 mg | DELAYED_RELEASE_TABLET | Freq: Once | ORAL | 0 refills | Status: DC

## 2020-04-15 MED ORDER — PEG 3350-KCL-NA BICARB-NACL 420 G PO SOLR: 4000.0000 mL | Freq: Once | ORAL | 0 refills | Status: DC

## 2020-04-15 MED FILL — TADALAFIL (PAH) 20 MG TABS: 20 | 30 days supply | Qty: 10 | Fill #0

## 2020-04-15 NOTE — Telephone Encounter (Signed)
Patient states his medicaid card needs to be updated and he is trying to get it done by his PCP. I sent in Golytely prep to pharmacy and new prep instructions on 3rd floor-pt aware to pick these up today and he needs to drink the prep starting tomorrow.

## 2020-04-15 NOTE — Telephone Encounter (Signed)
Referral placed.

## 2020-04-15 NOTE — Addendum Note (Signed)
Addended by: Levonne Spiller on: 04/15/2020 03:14 PM   Modules accepted: Orders

## 2020-04-15 NOTE — Telephone Encounter (Signed)
Called pt- went to voice mail- Mail box is full and could not leave message - Tony Ortega PV

## 2020-04-15 NOTE — Telephone Encounter (Signed)
Patient is going to take his current medicaid card information to the [facility] pharmacy and ask them to fill the suprep using the medicaid information. If the cost is still $28 the patient will call me back.

## 2020-04-15 NOTE — Patient Instructions (Signed)
Fall Prevention in the Home, Adult Falls can cause injuries and can happen to people of all ages. There are many things you can do to make your home safe and to help prevent falls. Ask for help when making these changes. What actions can I take to prevent falls? General Instructions  Use good lighting in all rooms. Replace any light bulbs that burn out.  Turn on the lights in dark areas. Use night-lights.  Keep items that you use often in easy-to-reach places. Lower the shelves around your home if needed.  Set up your furniture so you have a clear path. Avoid moving your furniture around.  Do not have throw rugs or other things on the floor that can make you trip.  Avoid walking on wet floors.  If any of your floors are uneven, fix them.  Add color or contrast paint or tape to clearly mark and help you see: ? Grab bars or handrails. ? First and last steps of staircases. ? Where the edge of each step is.  If you use a stepladder: ? Make sure that it is fully opened. Do not climb a closed stepladder. ? Make sure the sides of the stepladder are locked in place. ? Ask someone to hold the stepladder while you use it.  Know where your pets are when moving through your home. What can I do in the bathroom?  Keep the floor dry. Clean up any water on the floor right away.  Remove soap buildup in the tub or shower.  Use nonskid mats or decals on the floor of the tub or shower.  Attach bath mats securely with double-sided, nonslip rug tape.  If you need to sit down in the shower, use a plastic, nonslip stool.  Install grab bars by the toilet and in the tub and shower. Do not use towel bars as grab bars.      What can I do in the bedroom?  Make sure that you have a light by your bed that is easy to reach.  Do not use any sheets or blankets for your bed that hang to the floor.  Have a firm chair with side arms that you can use for support when you get dressed. What can I do in  the kitchen?  Clean up any spills right away.  If you need to reach something above you, use a step stool with a grab bar.  Keep electrical cords out of the way.  Do not use floor polish or wax that makes floors slippery. What can I do with my stairs?  Do not leave any items on the stairs.  Make sure that you have a light switch at the top and the bottom of the stairs.  Make sure that there are handrails on both sides of the stairs. Fix handrails that are broken or loose.  Install nonslip stair treads on all your stairs.  Avoid having throw rugs at the top or bottom of the stairs.  Choose a carpet that does not hide the edge of the steps on the stairs.  Check carpeting to make sure that it is firmly attached to the stairs. Fix carpet that is loose or worn. What can I do on the outside of my home?  Use bright outdoor lighting.  Fix the edges of walkways and driveways and fix any cracks.  Remove anything that might make you trip as you walk through a door, such as a raised step or threshold.  Trim any   bushes or trees on paths to your home.  Check to see if handrails are loose or broken and that both sides of all steps have handrails.  Install guardrails along the edges of any raised decks and porches.  Clear paths of anything that can make you trip, such as tools or rocks.  Have leaves, snow, or ice cleared regularly.  Use sand or salt on paths during winter.  Clean up any spills in your garage right away. This includes grease or oil spills. What other actions can I take?  Wear shoes that: ? Have a low heel. Do not wear high heels. ? Have rubber bottoms. ? Feel good on your feet and fit well. ? Are closed at the toe. Do not wear open-toe sandals.  Use tools that help you move around if needed. These include: ? Canes. ? Walkers. ? Scooters. ? Crutches.  Review your medicines with your doctor. Some medicines can make you feel dizzy. This can increase your chance  of falling. Ask your doctor what else you can do to help prevent falls. Where to find more information  Centers for Disease Control and Prevention, STEADI: www.cdc.gov  National Institute on Aging: www.nia.nih.gov Contact a doctor if:  You are afraid of falling at home.  You feel weak, drowsy, or dizzy at home.  You fall at home. Summary  There are many simple things that you can do to make your home safe and to help prevent falls.  Ways to make your home safe include removing things that can make you trip and installing grab bars in the bathroom.  Ask for help when making these changes in your home. This information is not intended to replace advice given to you by your health care provider. Make sure you discuss any questions you have with your health care provider. Document Revised: 08/30/2019 Document Reviewed: 08/30/2019 Elsevier Patient Education  2021 Elsevier Inc.  

## 2020-04-15 NOTE — Telephone Encounter (Signed)
Patient is requesting to speak with a nurse regarding previous message.

## 2020-04-15 NOTE — Progress Notes (Signed)
Assessment & Plan:  Tony Ortega was seen today for blood pressure check.  Diagnoses and all orders for this visit:  Type 2 diabetes, controlled, with peripheral neuropathy ([place]) -     Ambulatory referral to Podiatry  Essential hypertension -     amLODipine (NORVASC) 10 MG tablet; Take 1 tablet (10 mg total) by mouth daily. To lower blood pressure Continue all antihypertensives as prescribed.  Remember to bring in your blood pressure log with you for your follow up appointment.  DASH/Mediterranean Diets are healthier choices for HTN.    Patient has been counseled on age-appropriate routine health concerns for screening and prevention. These are reviewed and up-to-date. Referrals have been placed accordingly. Immunizations are up-to-date or declined.    Subjective:   Chief Complaint  Patient presents with  . Blood Pressure Check   HPI NEVAAN BUNTON 60 y.o. male presents to office today for follow up. He has a past medical history of DM2, Dyspnea, Hypertension, Neuropathy, Rheumatoid arthritis, and Stroke (09/2018).  Patient has been counseled on age-appropriate routine health concerns for screening and prevention. These are reviewed and up-to-date. Referrals have been placed accordingly. Immunizations are up-to-date or declined.    Colonoscopy: He states he could afford to pay for the prep out of pocket. I have instructed him to contact the GI office regarding this. PSA: Normal  Essential Hypertension He is currently taking HCTZ 25 mg daily and amlodipine 5 mg daily. Will increase amlodipine to 10 mg daily. Denies chest pain, shortness of breath, palpitations, lightheadedness, dizziness, headaches or BLE edema. Hyperglycemic symptoms include peripheral neuropathy for which he is taking gabapentin 600 mg TID.  BP Readings from Last 3 Encounters:  04/15/20 (!) 147/90  04/14/19 131/89  04/12/19 (!) 144/82    DM2 Well controlled with metformin 500 mg daily as prescribed. LDL nearly  optimized with atorvastatin 20 mg daily.  Lab Results  Component Value Date   HGBA1C 6.2 (H) 03/13/2020   Lab Results  Component Value Date   LDLCALC 74 03/13/2020    Erectile dysfunction States viagra was ineffective. I tried him on cialis which he stated was also ineffective and now he wants to try viagra again. I have instructed him that with his current health issues   Review of Systems  Constitutional: Negative for fever, malaise/fatigue and weight loss.  HENT: Negative.  Negative for nosebleeds.   Eyes: Negative.  Negative for blurred vision, double vision and photophobia.  Respiratory: Negative.  Negative for cough and shortness of breath.   Cardiovascular: Negative.  Negative for chest pain, palpitations and leg swelling.  Gastrointestinal: Negative.  Negative for heartburn, nausea and vomiting.  Genitourinary:       ED  Musculoskeletal: Negative.  Negative for myalgias.  Neurological: Negative.  Negative for dizziness, focal weakness, seizures and headaches.  Psychiatric/Behavioral: Negative.  Negative for suicidal ideas.    Past Medical History:  Diagnosis Date  . Diabetes mellitus without complication ([place])   . Dyspnea   . Hypertension   . Neuropathy   . Rheumatoid arthritis ([place])   . Stroke [facility]) 09/2018   patient thanks he had a stroke in Jan.2022 but no test was done    Past Surgical History:  Procedure Laterality Date  . ANTERIOR CERVICAL DECOMP/DISCECTOMY FUSION N/A 03/20/2019   Procedure: Cervical four-five Cervical six-seven  Anterior cervical decompression/discectomy/fusion;  Surgeon: Kristeen Miss, MD;  Location: [place];  Service: Neurosurgery;  Laterality: N/A;  . KNEE SURGERY Right     Family  History  Problem Relation Age of Onset  . Diabetes Mother   . Hypertension Mother   . Rheum arthritis Mother   . Other Father        unsure - thinks he may have history of cancer  . CAD Neg Hx   . Colon cancer Neg Hx   . Colon polyps Neg Hx   . Esophageal  cancer Neg Hx   . Rectal cancer Neg Hx   . Stomach cancer Neg Hx     Social History Reviewed with no changes to be made today.   Outpatient Medications Prior to Visit  Medication Sig Dispense Refill  . ACCU-CHEK FASTCLIX LANCETS MISC Use as directed twice daily to test blood sugar. 102 each 12  . aspirin 325 MG tablet Take 1 tablet (325 mg total) by mouth daily. Eat before taking medication 90 tablet 3  . atorvastatin (LIPITOR) 20 MG tablet Take 1 tablet (20 mg total) by mouth daily. 90 tablet 1  . Blood Glucose Monitoring Suppl (ACCU-CHEK GUIDE) w/Device KIT 1 each by Does not apply route 2 (two) times daily. Use as directed twice daily to test blood sugar. E11.42 1 kit 0  . gabapentin (NEURONTIN) 300 MG capsule Take 2 capsules (600 mg total) by mouth 3 (three) times daily. 180 capsule 11  . glucose blood (ACCU-CHEK GUIDE) test strip Use as instructed to check blood sugars up to 4 times per day E11.42 100 each 12  . hydrochlorothiazide (HYDRODIURIL) 25 MG tablet Take 1 tablet (25 mg total) by mouth daily. 90 tablet 0  . HYDROcodone-acetaminophen (NORCO/VICODIN) 5-325 MG tablet Take by mouth.    Marland Kitchen ibuprofen (ADVIL) 800 MG tablet Take 1 tablet (800 mg total) by mouth 3 (three) times daily. (Patient taking differently: Take 800 mg by mouth every 8 (eight) hours as needed for moderate pain.) 21 tablet 0  . metFORMIN (GLUCOPHAGE) 500 MG tablet Take 1 tablet (500 mg total) by mouth daily with breakfast. 90 tablet 1  . methocarbamol (ROBAXIN) 500 MG tablet Take 1 tablet (500 mg total) by mouth 4 (four) times daily. 30 tablet 1  . Na Sulfate-K Sulfate-Mg Sulf 17.5-3.13-1.6 GM/177ML SOLN Suprep (no substitutions)-TAKE AS DIRECTED. 354 mL 0  . omeprazole (PRILOSEC) 20 MG capsule Take 1 capsule (20 mg total) by mouth daily. To reduce stomach acid 90 capsule 4  . amLODipine (NORVASC) 5 MG tablet Take 1 tablet (5 mg total) by mouth daily. To lower blood pressure 90 tablet 0  . sildenafil (VIAGRA) 100 MG  tablet Take 0.5-1 tablets (50-100 mg total) by mouth daily as needed for erectile dysfunction. E11.69 and N52.1 10 tablet 11   No facility-administered medications prior to visit.    Allergies  Allergen Reactions  . Pork-Derived Products Other (See Comments)    No pork products       Objective:    BP (!) 147/90   Pulse 66   Resp 16   Wt 129 lb 6.4 oz (58.7 kg)   SpO2 99%   BMI 20.27 kg/m  Wt Readings from Last 3 Encounters:  04/15/20 129 lb 6.4 oz (58.7 kg)  04/08/20 133 lb (60.3 kg)  04/14/19 134 lb (60.8 kg)    Physical Exam Vitals and nursing note reviewed.  Constitutional:      Appearance: He is well-developed and well-nourished.  HENT:     Head: Normocephalic and atraumatic.  Eyes:     Extraocular Movements: EOM normal.  Cardiovascular:     Rate and Rhythm:  Normal rate and regular rhythm.     Pulses: Intact distal pulses.     Heart sounds: Normal heart sounds. No murmur heard. No friction rub. No gallop.   Pulmonary:     Effort: Pulmonary effort is normal. No tachypnea or respiratory distress.     Breath sounds: Normal breath sounds. No decreased breath sounds, wheezing, rhonchi or rales.  Chest:     Chest wall: No tenderness.  Abdominal:     General: Bowel sounds are normal.     Palpations: Abdomen is soft.  Musculoskeletal:        General: No edema. Normal range of motion.     Cervical back: Normal range of motion.  Skin:    General: Skin is warm and dry.  Neurological:     Mental Status: He is alert and oriented to person, place, and time.     Coordination: Coordination normal.  Psychiatric:        Mood and Affect: Mood and affect normal.        Behavior: Behavior normal. Behavior is cooperative.        Thought Content: Thought content normal.        Judgment: Judgment normal.          Patient has been counseled extensively about nutrition and exercise as well as the importance of adherence with medications and regular follow-up. The patient  was given clear instructions to go to ER or return to medical center if symptoms don't improve, worsen or new problems develop. The patient verbalized understanding.   Follow-up: Return in about 4 weeks (around 05/13/2020) for BP CHECK WITH LUKE.   Gildardo Pounds, FNP-BC [facility], [place]   04/16/2020, 7:36 PM

## 2020-04-15 NOTE — Telephone Encounter (Signed)
Patient states the Sulfate prep medication is too expensive please advise.

## 2020-04-16 ENCOUNTER — Telehealth: Payer: Self-pay

## 2020-04-16 ENCOUNTER — Other Ambulatory Visit: Payer: Self-pay | Admitting: Nurse Practitioner

## 2020-04-16 ENCOUNTER — Encounter: Payer: Self-pay | Admitting: Nurse Practitioner

## 2020-04-16 MED ORDER — PEG 3350-KCL-NA BICARB-NACL 420 G PO SOLR: 4000.0000 mL | Freq: Once | ORAL | 0 refills | Status: DC

## 2020-04-16 MED ORDER — SILDENAFIL CITRATE 100 MG PO TABS: 100.0000 mg | ORAL_TABLET | Freq: Every day | ORAL | 11 refills | Status: DC | PRN

## 2020-04-16 MED FILL — PEG 3350-ELECTROLYTE SOLN: 420 | 1 days supply | Qty: 4000 | Fill #0

## 2020-04-16 NOTE — Telephone Encounter (Signed)
Open in error

## 2020-04-16 NOTE — Telephone Encounter (Signed)
Resent Golytely to [facility]

## 2020-04-16 NOTE — Addendum Note (Signed)
Addended by: Steva Ready on: 04/16/2020 09:20 AM   Modules accepted: Orders

## 2020-04-16 NOTE — Telephone Encounter (Signed)
Inbound call from patient stating pharmacy does not have PEG script and is requesting it be resent please.

## 2020-04-17 ENCOUNTER — Other Ambulatory Visit: Payer: Self-pay

## 2020-04-17 ENCOUNTER — Ambulatory Visit: Payer: Medicaid Other | Attending: Nurse Practitioner

## 2020-04-17 ENCOUNTER — Encounter: Payer: Self-pay | Admitting: Podiatry

## 2020-04-17 ENCOUNTER — Ambulatory Visit (AMBULATORY_SURGERY_CENTER): Payer: Medicaid Other | Admitting: Gastroenterology

## 2020-04-17 ENCOUNTER — Encounter: Payer: Self-pay | Admitting: Gastroenterology

## 2020-04-17 ENCOUNTER — Ambulatory Visit (INDEPENDENT_AMBULATORY_CARE_PROVIDER_SITE_OTHER): Payer: Medicaid Other | Admitting: Podiatry

## 2020-04-17 ENCOUNTER — Telehealth: Payer: Self-pay | Admitting: *Deleted

## 2020-04-17 ENCOUNTER — Other Ambulatory Visit (HOSPITAL_COMMUNITY)
Admission: RE | Admit: 2020-04-17 | Discharge: 2020-04-17 | Disposition: A | Discharge status: Home / Self Care | Payer: Medicaid Other | Source: Ambulatory Visit | Attending: Nurse Practitioner | Admitting: Nurse Practitioner

## 2020-04-17 VITALS — BP 112/75 | HR 64 | Temp 97.8°F | Resp 24 | Ht 67.0 in | Wt 133.0 lb

## 2020-04-17 DIAGNOSIS — M79675 Pain in left toe(s): Secondary | ICD-10-CM | POA: Diagnosis not present

## 2020-04-17 DIAGNOSIS — Z7689 Persons encountering health services in other specified circumstances: Secondary | ICD-10-CM | POA: Diagnosis present

## 2020-04-17 DIAGNOSIS — E1142 Type 2 diabetes mellitus with diabetic polyneuropathy: Secondary | ICD-10-CM

## 2020-04-17 DIAGNOSIS — D123 Benign neoplasm of transverse colon: Secondary | ICD-10-CM

## 2020-04-17 DIAGNOSIS — M79674 Pain in right toe(s): Secondary | ICD-10-CM

## 2020-04-17 DIAGNOSIS — Z1211 Encounter for screening for malignant neoplasm of colon: Secondary | ICD-10-CM

## 2020-04-17 DIAGNOSIS — M2011 Hallux valgus (acquired), right foot: Secondary | ICD-10-CM

## 2020-04-17 DIAGNOSIS — B351 Tinea unguium: Secondary | ICD-10-CM

## 2020-04-17 DIAGNOSIS — M2012 Hallux valgus (acquired), left foot: Secondary | ICD-10-CM

## 2020-04-17 DIAGNOSIS — L600 Ingrowing nail: Secondary | ICD-10-CM

## 2020-04-17 MED ORDER — SODIUM CHLORIDE 0.9 % IV SOLN
500.0000 mL | Freq: Once | INTRAVENOUS | Status: DC
Start: 2020-04-17 — End: 2020-04-17

## 2020-04-17 NOTE — Progress Notes (Signed)
Called to room to assist during endoscopic procedure.  Patient ID and intended procedure confirmed with present staff. Received instructions for my participation in the procedure from the performing physician.  

## 2020-04-17 NOTE — Progress Notes (Signed)
Vitals by [place]. Pt's states no medical or surgical changes since previsit or office visit.  

## 2020-04-17 NOTE — Progress Notes (Signed)
Report to PACU, RN, vss, BBS= Clear.  

## 2020-04-17 NOTE — Op Note (Signed)
[place] Patient Name: Tony Ortega Procedure Date: 04/17/2020 9:07 AM MRN: [id number] Endoscopist: Thornton Park MD, MD Age: 60 Referring MD:  Date of Birth: 1961-01-15 Gender: Male Account #: [id number] Procedure:                Colonoscopy Indications:              Screening for colorectal malignant neoplasm, This                            is the patient's first colonoscopy Medicines:                Monitored Anesthesia Care Procedure:                Pre-Anesthesia Assessment:                           - Prior to the procedure, a History and Physical                            was performed, and patient medications and                            allergies were reviewed. The patient's tolerance of                            previous anesthesia was also reviewed. The risks                            and benefits of the procedure and the sedation                            options and risks were discussed with the patient.                            All questions were answered, and informed consent                            was obtained. Prior Anticoagulants: The patient has                            taken no previous anticoagulant or antiplatelet                            agents. ASA Grade Assessment: III - A patient with                            severe systemic disease. After reviewing the risks                            and benefits, the patient was deemed in                            satisfactory condition to undergo the procedure.  After obtaining informed consent, the colonoscope                            was passed under direct vision. Throughout the                            procedure, the patient's blood pressure, pulse, and                            oxygen saturations were monitored continuously. The                            Olympus CF-HQ190 (#[id number]) Colonoscope was                            introduced through the anus  and advanced to the 3                            cm into the ileum. The colonoscopy was performed                            without difficulty. The patient tolerated the                            procedure well. The quality of the bowel                            preparation was inadequate. The terminal ileum,                            ileocecal valve, appendiceal orifice, and rectum                            were photographed. Scope In: 9:56:55 AM Scope Out: 10:14:42 AM Scope Withdrawal Time: 0 hours 12 minutes 26 seconds  Total Procedure Duration: 0 hours 17 minutes 47 seconds  Findings:                 The perianal and digital rectal examinations were                            normal.                           A moderate amount of semi-liquid stool was found in                            the entire colon, interfering with visualization.                            Small and medium sized polyps as well as flat                            polyps could be missed.  Two sessile polyps were found in the hepatic                            flexure. The polyps were 2 to 3 mm in size. These                            polyps were removed with a cold snare. Resection                            and retrieval were complete. Estimated blood loss                            was minimal.                           The exam was otherwise without abnormality on                            direct and retroflexion views. Complications:            No immediate complications. Estimated blood loss:                            Minimal. Estimated Blood Loss:     Estimated blood loss was minimal. Impression:               - Preparation of the colon was inadequate.                           - Stool in the entire examined colon, limiting a                            complete evaluation.                           - Two 2 to 3 mm polyps at the hepatic flexure,                             removed with a cold snare. Resected and retrieved.                           - The examination was otherwise normal on direct                            and retroflexion views. Recommendation:           - Patient has a contact number available for                            emergencies. The signs and symptoms of potential                            delayed complications were discussed with the  patient. Return to normal activities tomorrow.                            Written discharge instructions were provided to the                            patient.                           - Resume previous diet.                           - Continue present medications.                           - Await pathology results.                           - Repeat colonoscopy at the next available                            appointment because the bowel preparation was poor.                            Two day bowel prep at that time.                           - Emerging evidence supports eating a diet of                            fruits, vegetables, grains, calcium, and yogurt                            while reducing red meat and alcohol may reduce the                            risk of colon cancer.                           - Thank you for allowing me to be involved in your                            colon cancer prevention. Thornton Park MD, MD 04/17/2020 10:23:50 AM This report has been signed electronically.

## 2020-04-17 NOTE — Progress Notes (Signed)
Pt arrived at clinic for urine cytology. Urine was obtained and patient was informed that he will be contacted with results.

## 2020-04-17 NOTE — Telephone Encounter (Signed)
Patient arrived to [facility] requesting test for STD screening. Was informed that he had an encounter with Trichomonas.

## 2020-04-17 NOTE — Patient Instructions (Signed)
Handout given:  Polyps Resume previous diet Continue current medications Await pathology results  YOU HAD AN ENDOSCOPIC PROCEDURE TODAY AT THE [facility]:   Refer to the procedure report that was given to you for any specific questions about what was found during the examination.  If the procedure report does not answer your questions, please call your gastroenterologist to clarify.  If you requested that your care partner not be given the details of your procedure findings, then the procedure report has been included in a sealed envelope for you to review at your convenience later.  YOU SHOULD EXPECT: Some feelings of bloating in the abdomen. Passage of more gas than usual.  Walking can help get rid of the air that was put into your GI tract during the procedure and reduce the bloating. If you had a lower endoscopy (such as a colonoscopy or flexible sigmoidoscopy) you may notice spotting of blood in your stool or on the toilet paper. If you underwent a bowel prep for your procedure, you may not have a normal bowel movement for a few days.  Please Note:  You might notice some irritation and congestion in your nose or some drainage.  This is from the oxygen used during your procedure.  There is no need for concern and it should clear up in a day or so.  SYMPTOMS TO REPORT IMMEDIATELY:   Following lower endoscopy (colonoscopy or flexible sigmoidoscopy):  Excessive amounts of blood in the stool  Significant tenderness or worsening of abdominal pains  Swelling of the abdomen that is new, acute  Fever of 100F or higher  For urgent or emergent issues, a gastroenterologist can be reached at any hour by calling [phone]. Do not use MyChart messaging for urgent concerns.    DIET:  We do recommend a small meal at first, but then you may proceed to your regular diet.  Drink plenty of fluids but you should avoid alcoholic beverages for 24 hours.  ACTIVITY:  You should plan to take  it easy for the rest of today and you should NOT DRIVE or use heavy machinery until tomorrow (because of the sedation medicines used during the test).    FOLLOW UP: Our staff will call the number listed on your records 48-72 hours following your procedure to check on you and address any questions or concerns that you may have regarding the information given to you following your procedure. If we do not reach you, we will leave a message.  We will attempt to reach you two times.  During this call, we will ask if you have developed any symptoms of COVID 19. If you develop any symptoms (ie: fever, flu-like symptoms, shortness of breath, cough etc.) before then, please call [phone].  If you test positive for Covid 19 in the 2 weeks post procedure, please call and report this information to us.    If any biopsies were taken you will be contacted by phone or by letter within the next 1-3 weeks.  Please call us at [phone] if you have not heard about the biopsies in 3 weeks.   SIGNATURES/CONFIDENTIALITY: You and/or your care partner have signed paperwork which will be entered into your electronic medical record.  These signatures attest to the fact that that the information above on your After Visit Summary has been reviewed and is understood.  Full responsibility of the confidentiality of this discharge information lies with you and/or your care-partner. 

## 2020-04-19 ENCOUNTER — Telehealth: Payer: Self-pay

## 2020-04-19 ENCOUNTER — Telehealth: Payer: Self-pay | Admitting: Gastroenterology

## 2020-04-19 ENCOUNTER — Telehealth: Payer: Self-pay | Admitting: *Deleted

## 2020-04-19 ENCOUNTER — Encounter: Payer: Self-pay | Admitting: Gastroenterology

## 2020-04-19 LAB — URINE CYTOLOGY ANCILLARY ONLY
Chlamydia: NEGATIVE
Comment: NEGATIVE
Comment: NEGATIVE
Comment: NORMAL
Neisseria Gonorrhea: NEGATIVE
Trichomonas: NEGATIVE

## 2020-04-19 NOTE — Telephone Encounter (Signed)
Colonoscopy reviewed which was performed by Dr. Tarri Glenn 48 hours ago. A small knot on his back is unlikely to be related to colonoscopy as this procedure is done internally. I would recommend that he have this evaluated by his primary care

## 2020-04-19 NOTE — Telephone Encounter (Signed)
PT called stating that he has a small knot on his left lower back that is painful he has noticed that appeared after his colonoscopy on 04/17/20. He has no fever or any other symptoms. Told the patient to I will notify Dr. Hilarie Fredrickson who is the on today since Dr. Payton Emerald is unavailable. Encouraged him to use heat for the moment.

## 2020-04-19 NOTE — Telephone Encounter (Signed)
  Follow up Call-  Call back number 04/17/2020  Post procedure Call Back phone  # [phone]  Permission to leave phone message Yes  Some recent data might be hidden     Patient questions:  Do you have a fever, pain , or abdominal swelling? No. Pain Score  0 *  Have you tolerated food without any problems? Yes.    Have you been able to return to your normal activities? Yes.    Do you have any questions about your discharge instructions: Diet   No. Medications  No. Follow up visit  No.  Do you have questions or concerns about your Care? No.  Actions: * If pain score is 4 or above: 1. No action needed, pain <4.Have you developed a fever since your procedure? no  2.   Have you had an respiratory symptoms (SOB or cough) since your procedure? no  3.   Have you tested positive for COVID 19 since your procedure no  4.   Have you had any family members/close contacts diagnosed with the COVID 19 since your procedure?  no   If yes to any of these questions please route to Joylene John, RN and Joella Prince, RN

## 2020-04-19 NOTE — Telephone Encounter (Signed)
Attempted to call patient back to let him know that he should have the knot on his back evaluated by his primary care per Dr. Hilarie Fredrickson who is on call today as Dr. Payton Emerald in away. Unable to leave message.

## 2020-04-19 NOTE — Telephone Encounter (Signed)
Pt is requesting a call back stating after his colonoscopy he has a knot on his back which is very painful.

## 2020-04-19 NOTE — Telephone Encounter (Signed)
Spoke with Patient and told him to see his primary physician regarding the knot in his back per Dr.Pyrtle. He verbalized understanding. Also scheduled the patient for his repeat colonoscopy and Pre visit.

## 2020-04-19 NOTE — Telephone Encounter (Signed)
Will CC: Dr. Tarri Glenn (who is away today) just as [facility]

## 2020-04-21 NOTE — Progress Notes (Signed)
  Subjective:  Patient ID: Tony Ortega, male    DOB: February 28, 1960,  MRN: [id number]  60 y.o. male presents with preventative diabetic foot care and painful thick toenails that are difficult to trim. Pain interferes with ambulation. Aggravating factors include wearing enclosed shoe gear. Pain is relieved with periodic professional debridement..    Patient's last A1c was 6.2%.   He states he was incarcerated for the past six months due to false accusation.  He has had his colonoscopy performed this morning.  PCP: Gildardo Pounds, NP and last visit was: 04/15/2020.  Review of Systems: Negative except as noted in the HPI.   Allergies  Allergen Reactions  . Pork-Derived Products Other (See Comments)    No pork products    Objective:  There were no vitals filed for this visit. Constitutional Patient is a pleasant 60 y.o. African American male WD, WN in NAD. AAO x 3.  Vascular Capillary refill time to digits immediate b/l. Palpable pedal pulses b/l LE. Pedal hair present. Lower extremity skin temperature gradient within normal limits. No pain with calf compression b/l. No edema noted b/l lower extremities. No cyanosis or clubbing noted.  Neurologic Normal speech. Pt has subjective symptoms of neuropathy. Protective sensation decreased with 10 gram monofilament b/l. Vibratory sensation intact b/l.  Dermatologic Pedal skin with normal turgor, texture and tone bilaterally. No open wounds bilaterally. No interdigital macerations bilaterally. Toenails 1-5 b/l elongated, discolored, dystrophic, thickened, crumbly with subungual debris and tenderness to dorsal palpation. Incurvated nailplate medial border(s) L hallux.  Nail border hypertrophy minimal. There is tenderness to palpation. Sign(s) of infection: no clinical signs of infection noted on examination today..  Orthopedic: Normal muscle strength 5/5 to all lower extremity muscle groups bilaterally. No pain crepitus or joint limitation noted with ROM  b/l. Hallux valgus with bunion deformity noted b/l lower extremities.   Hemoglobin A1C Latest Ref Rng & Units 03/13/2020  HGBA1C 4.8 - 5.6 % 6.2(H)  Some recent data might be hidden   Assessment:   1. Pain due to onychomycosis of toenails of both feet   2. Ingrown toenail without infection   3. Hallux valgus, acquired, bilateral   4. Type 2 diabetes, controlled, with peripheral neuropathy ([place])    Plan:  Patient was evaluated and treated and all questions answered.  Onychomycosis with pain -Nails palliatively debridement as below. -Educated on self-care  Procedure: Nail Debridement Rationale: Pain Type of Debridement: manual, sharp debridement. Instrumentation: Nail nipper, rotary burr. Number of Nails: 10  -Examined patient. -Patient to continue soft, supportive shoe gear daily. -Toenails 1-5 b/l were debrided in length and girth with sterile nail nippers and dremel without iatrogenic bleeding.  -Offending nail border debrided and curretaged L hallux utilizing sterile nail nipper and currette. Border(s) cleansed with alcohol and triple antibiotic ointment applied. Patient instructed to apply triple antibiotic ointment to L hallux once daily for 7 days. -Patient to report any pedal injuries to medical professional immediately. -Patient/POA to call should there be question/concern in the interim.  Return in about 3 months (around 07/18/2020) for diabetic nail trim.  Marzetta Board, DPM

## 2020-04-22 NOTE — Telephone Encounter (Signed)
Spoke with pt and he knows to contact his PCP regarding the knot on his back.

## 2020-04-22 NOTE — Telephone Encounter (Signed)
Please try to review these recommendations with the patient today. Thank you.

## 2020-04-23 ENCOUNTER — Encounter: Payer: Self-pay | Admitting: Gastroenterology

## 2020-05-03 ENCOUNTER — Telehealth: Payer: Self-pay | Admitting: Nurse Practitioner

## 2020-05-03 NOTE — Telephone Encounter (Signed)
Pt dropped off [facility] [employer] consent form to filled out by PCP. Placing form in PCP box. Advise Pt when ready to be picked up

## 2020-05-03 NOTE — Telephone Encounter (Signed)
FYI

## 2020-05-13 ENCOUNTER — Ambulatory Visit: Payer: Medicaid Other | Admitting: Pharmacist

## 2020-05-14 ENCOUNTER — Ambulatory Visit: Payer: Medicaid Other | Attending: Nurse Practitioner | Admitting: Pharmacist

## 2020-05-14 ENCOUNTER — Other Ambulatory Visit: Payer: Self-pay

## 2020-05-14 ENCOUNTER — Ambulatory Visit: Payer: Self-pay | Admitting: *Deleted

## 2020-05-14 ENCOUNTER — Encounter: Payer: Self-pay | Admitting: Pharmacist

## 2020-05-14 VITALS — BP 157/98

## 2020-05-14 DIAGNOSIS — I10 Essential (primary) hypertension: Secondary | ICD-10-CM

## 2020-05-14 NOTE — Telephone Encounter (Signed)
Pt called in c/o having chest tightness in the center of his chest and to the right chest.   He was seen this morning by Abbie Sons, RPH-CPP for essential hypertension.  Pt stated,  "I got anxious when I saw how high my BP was".    I started having the chest tightness not long after that".   "It's gotten worse".   Any time I'm moving around it's worse".   "When I'm laying dow it's not so bad the tightness".  Denies any other symptoms.    He has taken his BP medication today.  I have referred him to the ED per the protocol for chest pain.  He was agreeable to going later to [facility].   I let him know he needed to go on now.   "I have a friend that is coming to get me in 30 minutes".    I let him know to call 911 but he did not want to do that.  I went over the s/s to call 911 if they happened before his friend got him to the ED.  Increased chest tightness/discomfort, shortness of breath, breaking out in a sweat, weakness, dizziness, nausea, feeling like he could pass out.    He verbalized understanding,  "I will call 911 for sure".   I sent notes to [facility].    Reason for Disposition . [1] Chest pain lasts > 5 minutes AND [2] described as crushing, pressure-like, or heavy  Answer Assessment - Initial Assessment Questions 1. LOCATION: "Where does it hurt?"       You were seen this morning at [facility] for hypertension.   Given medication. 2. RADIATION: "Does the pain go anywhere else?" (e.g., into neck, jaw, arms, back)     Discomfort in chest to the right and middle and feels real tight.   Denies radiation. 3. ONSET: "When did the chest pain begin?" (Minutes, hours or days)      Started right after they checked my BP at the office.    If I'm laying down it doesn't hurt but once I start moving around the tightness starts. 4. PATTERN "Does the pain come and go, or has it been constant since it started?"  "Does it get worse  with exertion?"      Tight when moving around but laying down does not feel tightness. 5. DURATION: "How long does it last" (e.g., seconds, minutes, hours)     See above 6. SEVERITY: "How bad is the pain?"  (e.g., Scale 1-10; mild, moderate, or severe)    - MILD (1-3): doesn't interfere with normal activities     - MODERATE (4-7): interferes with normal activities or awakens from sleep    - SEVERE (8-10): excruciating pain, unable to do any normal activities       5 on scale    7. CARDIAC RISK FACTORS: "Do you have any history of heart problems or risk factors for heart disease?" (e.g., angina, prior heart attack; diabetes, high blood pressure, high cholesterol, smoker, or strong family history of heart disease)     No 8. PULMONARY RISK FACTORS: "Do you have any history of lung disease?"  (e.g., blood clots in lung, asthma, emphysema, birth control pills)     I smoke only. 9. CAUSE: "What do you think is causing the chest pain?"     I don't know.   I think it's from anxiety from seeing my high  BP numbers.   158/97 was the reading this morning. 10. OTHER SYMPTOMS: "Do you have any other symptoms?" (e.g., dizziness, nausea, vomiting, sweating, fever, difficulty breathing, cough)       No dizziness or headaches, no sweating or nausea. 11. PREGNANCY: "Is there any chance you are pregnant?" "When was your last menstrual period?"       N/A  Protocols used: CHEST PAIN-A-AH

## 2020-05-14 NOTE — Progress Notes (Signed)
   S:    PCP: Geryl Rankins   Patient arrives in good spirits. Presents to the clinic for hypertension evaluation, counseling, and management. Patient was referred and last seen by Primary Care Provider on 04/15/2020. At that visit, pt's amlodipine dose was increased.   Medication adherence reported, however, he has not taken his medication this morning.  Current BP Medications include:  Amlodipine 10 mg daily, HCTZ 25 mg daily  Dietary habits include: compliant with salt restriction; admits to drinking 6-8 cups of coffee daily Exercise habits include: does not engage in regular exercise  Family / Social history:  Fhx: DM, HTN, RA, CAD Tobacco: current every day smoker Alcohol: denies use   O:  Vitals:   05/14/20 1212  BP: (!) 157/98    Home BP readings: none   Last 3 Office BP readings: BP Readings from Last 3 Encounters:  05/14/20 (!) 157/98  04/17/20 112/75  04/15/20 (!) 147/90    BMET    Component Value Date/Time   NA 137 03/13/2020 1130   K 4.0 03/13/2020 1130   CL 99 03/13/2020 1130   CO2 24 03/13/2020 1130   GLUCOSE 120 (H) 03/13/2020 1130   GLUCOSE 122 (H) 03/09/2019 0958   BUN 11 03/13/2020 1130   CREATININE 0.92 03/13/2020 1130   CALCIUM 9.9 03/13/2020 1130   GFRNONAA 89 03/13/2020 1130   GFRAA 103 03/13/2020 1130    Renal function: CrCl cannot be calculated (Patient's most recent lab result is older than the maximum 21 days allowed.).  Clinical ASCVD: Yes  has hx of CVA listed on his snapshot  A/P: Hypertension longstanding currently uncontrolled on current medications. BP Goal = < 130/80 mmHg. Medication adherence reported, however, he did not take his medications this morning. He agrees to take these when he returns home. He will return in 2 weeks for reassessment. He knows to take his medication before coming to his appointment.  -Continued current regimen.  -Counseled on lifestyle modifications for blood pressure control including reduced dietary  sodium, increased exercise, adequate sleep.  Results reviewed and written information provided.   Total time in face-to-face counseling 15 minutes.   F/U Clinic Visit in 2 weeks.  Benard Halsted, PharmD, [place], [place] [phone]

## 2020-05-14 NOTE — Telephone Encounter (Signed)
FYI

## 2020-05-28 ENCOUNTER — Ambulatory Visit: Payer: Medicaid Other | Admitting: Pharmacist

## 2020-05-29 ENCOUNTER — Ambulatory Visit: Payer: Medicaid Other | Admitting: Pharmacist

## 2020-05-29 NOTE — Progress Notes (Unsigned)
   S:    Patient arrives ***.    Presents to the clinic for hypertension evaluation, counseling, and management.  Patient was referred and last seen by Primary Care Provider on ***.  Patient was referred on ***.  Patient was last seen by Primary Care Provider on ***.   Medication adherence *** .  Current BP Medications include:  ***  Antihypertensives tried in the past include: ***  Dietary habits include: *** Exercise habits include:*** Family / Social history: ***  ASCVD risk factors include:***   O:  Physical Exam  ROS  Home BP readings: ***  Last 3 Office BP readings: BP Readings from Last 3 Encounters:  05/14/20 (!) 157/98  04/17/20 112/75  04/15/20 (!) 147/90    BMET    Component Value Date/Time   NA 137 03/13/2020 1130   K 4.0 03/13/2020 1130   CL 99 03/13/2020 1130   CO2 24 03/13/2020 1130   GLUCOSE 120 (H) 03/13/2020 1130   GLUCOSE 122 (H) 03/09/2019 0958   BUN 11 03/13/2020 1130   CREATININE 0.92 03/13/2020 1130   CALCIUM 9.9 03/13/2020 1130   GFRNONAA 89 03/13/2020 1130   GFRAA 103 03/13/2020 1130    Renal function: CrCl cannot be calculated (Patient's most recent lab result is older than the maximum 21 days allowed.).  Clinical ASCVD: {YES/NO:21197} The 10-year ASCVD risk score Mikey Bussing DC Jr., et al., 2013) is: 48.2%   Values used to calculate the score:     Age: 26 years     Sex: Male     Is Non-Hispanic African American: Yes     Diabetic: Yes     Tobacco smoker: Yes     Systolic Blood Pressure: 270 mmHg     Is BP treated: Yes     HDL Cholesterol: 63 mg/dL     Total Cholesterol: 155 mg/dL   A/P: Hypertension diagnosed *** currently *** on current medications. BP Goal = <130/80 mmHg. Medication adherence ***.  -Continued amlodipine 10 mg daily -Continued HCTZ 25 mg daily -Counseled on lifestyle modifications for blood pressure control including reduced dietary sodium, increased exercise, adequate sleep.  Results reviewed and written  information provided.   Total time in face-to-face counseling *** minutes.   F/U Clinic Visit in ***.    Lorel Monaco, PharmD, [place] PGY2 Ambulatory Care Resident [place]

## 2020-06-10 ENCOUNTER — Telehealth: Payer: Self-pay | Admitting: *Deleted

## 2020-06-10 NOTE — Telephone Encounter (Signed)
NOS letter sent.  Colonoscopy for 06-12-20 cancelled

## 2020-06-10 NOTE — Telephone Encounter (Signed)
Pt was a NOS to [facility] today.  His appointment was at 3:30 pm.  At 3:45 pm attempted to reach pt by phone- his mother answered and states she doesn't know where he is.  She has no other number for him.  I asked her to have him call my office back before the end of the day and understanding voiced.

## 2020-06-12 ENCOUNTER — Encounter: Payer: Medicaid Other | Admitting: Gastroenterology

## 2020-06-20 ENCOUNTER — Ambulatory Visit: Payer: Medicaid Other | Admitting: Pharmacist

## 2020-06-24 ENCOUNTER — Other Ambulatory Visit: Payer: Self-pay

## 2020-06-24 ENCOUNTER — Ambulatory Visit: Payer: Medicaid Other | Attending: Nurse Practitioner | Admitting: Pharmacist

## 2020-06-24 ENCOUNTER — Encounter: Payer: Self-pay | Admitting: Pharmacist

## 2020-06-24 VITALS — BP 100/68

## 2020-06-24 DIAGNOSIS — I10 Essential (primary) hypertension: Secondary | ICD-10-CM | POA: Diagnosis not present

## 2020-06-24 NOTE — Progress Notes (Signed)
   S:    PCP: Geryl Rankins   Patient arrives in good spirits. Presents to the clinic for hypertension evaluation, counseling, and management. Patient was referred and last seen by Primary Care Provider on 04/15/2020. At that visit, pt's amlodipine dose was increased.   Medication adherence reported. He took both this morning.   Current BP Medications include:  Amlodipine 10 mg daily, HCTZ 25 mg daily  Dietary habits include: compliant with salt restriction; admits to drinking 6-8 cups of coffee daily Exercise habits include: does not engage in regular exercise  Family / Social history:  Fhx: DM, HTN, RA, CAD Tobacco: current every day smoker Alcohol: denies use   O:  Vitals:   06/24/20 1521  BP: 100/68   Home BP readings: none   Last 3 Office BP readings: BP Readings from Last 3 Encounters:  06/24/20 100/68  05/14/20 (!) 157/98  04/17/20 112/75   BMET    Component Value Date/Time   NA 137 03/13/2020 1130   K 4.0 03/13/2020 1130   CL 99 03/13/2020 1130   CO2 24 03/13/2020 1130   GLUCOSE 120 (H) 03/13/2020 1130   GLUCOSE 122 (H) 03/09/2019 0958   BUN 11 03/13/2020 1130   CREATININE 0.92 03/13/2020 1130   CALCIUM 9.9 03/13/2020 1130   GFRNONAA 89 03/13/2020 1130   GFRAA 103 03/13/2020 1130    Renal function: CrCl cannot be calculated (Patient's most recent lab result is older than the maximum 21 days allowed.).  Clinical ASCVD: Yes  has hx of CVA listed on his snapshot  A/P: Hypertension longstanding currently at goal on current medications. BP Goal = < 130/80 mmHg. Medication adherence reported. -Continued current regimen.  -Counseled on lifestyle modifications for blood pressure control including reduced dietary sodium, increased exercise, adequate sleep.  Results reviewed and written information provided.   Total time in face-to-face counseling 15 minutes.   F/U Clinic Visit in July.  Benard Halsted, PharmD, [facility] [place], [place] [phone]

## 2020-07-08 NOTE — Progress Notes (Signed)
Appt canceled!

## 2020-07-11 ENCOUNTER — Telehealth: Payer: Self-pay | Admitting: Nurse Practitioner

## 2020-07-11 NOTE — Telephone Encounter (Signed)
Copied from [place] [id number]. Topic: General - Other >> Jul 09, 2020  8:35 AM Tessa Lerner A wrote: Reason for CRM: Patient shares that they began experiencing back pain yesterday 07/08/20 towards their lumbar region  Patient would like to be referred to outpatient rehab, they've been referred previously in 2019 for assistance with their back pain   Prov. Fleming's earliest OV at the time of call was 08/28/20 and patient has been scheduled for it  Please contact to further advise if needed   Patient has two upcoming visits with Zelda scheduled. The appointment on July the 8th pt stated it was for prednisone refills but did not disclose any further information. Can referral for PT be placed or does patient need to see provider first. Please advise.

## 2020-07-12 NOTE — Telephone Encounter (Signed)
Will forward to provider  

## 2020-07-15 ENCOUNTER — Other Ambulatory Visit: Payer: Self-pay

## 2020-07-15 MED FILL — Hydrochlorothiazide Tab 25 MG: ORAL | 30 days supply | Qty: 30 | Fill #0 | Status: AC

## 2020-07-15 MED FILL — Amlodipine Besylate Tab 10 MG (Base Equivalent): ORAL | 30 days supply | Qty: 30 | Fill #0 | Status: AC

## 2020-07-24 ENCOUNTER — Encounter (HOSPITAL_COMMUNITY): Payer: Self-pay | Admitting: Pharmacy Technician

## 2020-07-24 ENCOUNTER — Emergency Department (HOSPITAL_COMMUNITY): Payer: Medicaid Other

## 2020-07-24 ENCOUNTER — Other Ambulatory Visit: Payer: Self-pay

## 2020-07-24 ENCOUNTER — Emergency Department (HOSPITAL_COMMUNITY)
Admission: EM | Admit: 2020-07-24 | Discharge: 2020-07-24 | Disposition: A | Discharge status: Home / Self Care | Payer: Medicaid Other | Attending: Emergency Medicine | Admitting: Emergency Medicine

## 2020-07-24 DIAGNOSIS — M25512 Pain in left shoulder: Secondary | ICD-10-CM | POA: Diagnosis present

## 2020-07-24 DIAGNOSIS — Z5321 Procedure and treatment not carried out due to patient leaving prior to being seen by health care provider: Secondary | ICD-10-CM | POA: Diagnosis not present

## 2020-07-24 NOTE — ED Triage Notes (Signed)
Pt here with L shoulder pain. Pt reports pain is worse with movement. Denies known injury.

## 2020-07-24 NOTE — ED Notes (Signed)
Pt named called to be room, no response

## 2020-07-25 ENCOUNTER — Ambulatory Visit: Payer: Medicaid Other | Admitting: Pharmacist

## 2020-07-31 ENCOUNTER — Ambulatory Visit: Payer: Medicaid Other | Admitting: Podiatry

## 2020-08-02 ENCOUNTER — Encounter: Payer: Self-pay | Admitting: Podiatry

## 2020-08-02 ENCOUNTER — Other Ambulatory Visit: Payer: Self-pay

## 2020-08-02 ENCOUNTER — Ambulatory Visit (INDEPENDENT_AMBULATORY_CARE_PROVIDER_SITE_OTHER): Payer: Medicaid Other | Admitting: Podiatry

## 2020-08-02 DIAGNOSIS — E1142 Type 2 diabetes mellitus with diabetic polyneuropathy: Secondary | ICD-10-CM | POA: Diagnosis not present

## 2020-08-02 DIAGNOSIS — M79674 Pain in right toe(s): Secondary | ICD-10-CM

## 2020-08-02 DIAGNOSIS — M79675 Pain in left toe(s): Secondary | ICD-10-CM

## 2020-08-02 DIAGNOSIS — B351 Tinea unguium: Secondary | ICD-10-CM | POA: Diagnosis not present

## 2020-08-04 NOTE — Progress Notes (Signed)
  Subjective:  Patient ID: Tony Ortega, male    DOB: 1960/08/13,  MRN: [id number]  [name] presents to clinic today for at risk foot care with history of diabetic neuropathy and painful thick toenails that are difficult to trim. Pain interferes with ambulation. Aggravating factors include wearing enclosed shoe gear. Pain is relieved with periodic professional debridement.  He voices no new pedal problems on today's visit.  PCP is Gildardo Pounds, NP , and last visit was 04/15/2020.  Allergies  Allergen Reactions   Pork-Derived Products Other (See Comments)    No pork products    Review of Systems: Negative except as noted in the HPI. Objective:   Constitutional Tony Ortega is a pleasant 60 y.o. African American male, WD, WN in NAD. AAO x 3.   Vascular Capillary refill time to digits immediate b/l. Palpable pedal pulses b/l LE. Pedal hair present. Lower extremity skin temperature gradient within normal limits. No pain with calf compression b/l. No edema noted b/l lower extremities. No cyanosis or clubbing noted.  Neurologic Normal speech. Oriented to person, place, and time. Pt has subjective symptoms of neuropathy. Protective sensation intact 5/5 intact bilaterally with 10g monofilament b/l. Vibratory sensation intact b/l.  Dermatologic Pedal skin with normal turgor, texture and tone b/l lower extremities No open wounds b/l lower extremities No interdigital macerations b/l lower extremities Toenails 1-4 b/l elongated, discolored, dystrophic, thickened, and crumbly with subungual debris and tenderness to dorsal palpation. Anonychia noted L 5th toe and R 5th toe. Nailbed(s) epithelialized.   Orthopedic: Normal muscle strength 5/5 to all lower extremity muscle groups bilaterally. No pain crepitus or joint limitation noted with ROM b/l. Hallux valgus with bunion deformity noted b/l lower extremities.   Radiographs: None Assessment:   1. Pain due to onychomycosis of toenails of  both feet   2. Type 2 diabetes, controlled, with peripheral neuropathy ([place])    Plan:  Patient was evaluated and treated and all questions answered.  Onychomycosis with pain -Nails palliatively debridement as below -Educated on self-care  Procedure: Nail Debridement Rationale: Pain Type of Debridement: manual, sharp debridement. Instrumentation: Nail nipper, rotary burr. Number of Nails: 8 -Examined patient. -Patient to continue soft, supportive shoe gear daily. -Toenails 1-4 b/l debrided in length and girth without iatrogenic bleeding with sterile nail nipper and dremel.  -Patient to report any pedal injuries to medical professional immediately. -Patient/POA to call should there be question/concern in the interim.  Return in about 3 months (around 11/02/2020).  Marzetta Board, DPM

## 2020-08-16 ENCOUNTER — Ambulatory Visit: Payer: Medicaid Other | Admitting: Nurse Practitioner

## 2020-08-26 ENCOUNTER — Other Ambulatory Visit: Payer: Self-pay

## 2020-08-26 MED FILL — Hydrochlorothiazide Tab 25 MG: ORAL | 30 days supply | Qty: 30 | Fill #1 | Status: AC

## 2020-08-26 MED FILL — Amlodipine Besylate Tab 10 MG (Base Equivalent): ORAL | 30 days supply | Qty: 30 | Fill #1 | Status: AC

## 2020-08-28 ENCOUNTER — Telehealth: Payer: Self-pay | Admitting: Nurse Practitioner

## 2020-08-28 ENCOUNTER — Ambulatory Visit: Payer: Medicaid Other | Attending: Nurse Practitioner | Admitting: Nurse Practitioner

## 2020-08-28 ENCOUNTER — Other Ambulatory Visit: Payer: Self-pay

## 2020-08-28 NOTE — Telephone Encounter (Signed)
Contact Information  [phone] (Home Phone) MAILBOX FULL. Unable to LVM

## 2020-08-28 NOTE — Telephone Encounter (Signed)
[phone] (Mobile)  Mother answered phone. States patient is not at home but she will tell him we called

## 2020-09-12 ENCOUNTER — Ambulatory Visit: Payer: Medicaid Other | Admitting: Nurse Practitioner

## 2020-09-12 ENCOUNTER — Other Ambulatory Visit: Payer: Self-pay

## 2020-09-12 ENCOUNTER — Ambulatory Visit: Payer: Medicaid Other | Admitting: Pharmacist

## 2020-09-26 ENCOUNTER — Other Ambulatory Visit: Payer: Self-pay | Admitting: Nurse Practitioner

## 2020-09-26 ENCOUNTER — Other Ambulatory Visit: Payer: Self-pay

## 2020-09-26 DIAGNOSIS — I10 Essential (primary) hypertension: Secondary | ICD-10-CM

## 2020-09-26 MED ORDER — HYDROCHLOROTHIAZIDE 25 MG PO TABS
ORAL_TABLET | Freq: Every day | ORAL | 0 refills | Status: DC
  Filled 2020-09-26: qty 30, 30d supply, fill #0

## 2020-09-26 MED FILL — Amlodipine Besylate Tab 10 MG (Base Equivalent): ORAL | 30 days supply | Qty: 30 | Fill #2 | Status: AC

## 2020-10-01 ENCOUNTER — Ambulatory Visit: Payer: Medicaid Other | Admitting: Nurse Practitioner

## 2020-10-07 ENCOUNTER — Ambulatory Visit: Payer: Medicaid Other | Attending: Nurse Practitioner | Admitting: Nurse Practitioner

## 2020-10-07 ENCOUNTER — Other Ambulatory Visit: Payer: Self-pay

## 2020-10-07 ENCOUNTER — Encounter: Payer: Self-pay | Admitting: Nurse Practitioner

## 2020-10-07 VITALS — BP 112/77 | HR 68 | Ht 66.0 in | Wt 125.0 lb

## 2020-10-07 DIAGNOSIS — Z79899 Other long term (current) drug therapy: Secondary | ICD-10-CM | POA: Diagnosis not present

## 2020-10-07 DIAGNOSIS — R7989 Other specified abnormal findings of blood chemistry: Secondary | ICD-10-CM | POA: Diagnosis not present

## 2020-10-07 DIAGNOSIS — I10 Essential (primary) hypertension: Secondary | ICD-10-CM | POA: Insufficient documentation

## 2020-10-07 DIAGNOSIS — R269 Unspecified abnormalities of gait and mobility: Secondary | ICD-10-CM | POA: Diagnosis not present

## 2020-10-07 DIAGNOSIS — E1142 Type 2 diabetes mellitus with diabetic polyneuropathy: Secondary | ICD-10-CM | POA: Insufficient documentation

## 2020-10-07 DIAGNOSIS — M2550 Pain in unspecified joint: Secondary | ICD-10-CM | POA: Diagnosis present

## 2020-10-07 DIAGNOSIS — M069 Rheumatoid arthritis, unspecified: Secondary | ICD-10-CM | POA: Diagnosis not present

## 2020-10-07 DIAGNOSIS — Z7982 Long term (current) use of aspirin: Secondary | ICD-10-CM | POA: Insufficient documentation

## 2020-10-07 DIAGNOSIS — R296 Repeated falls: Secondary | ICD-10-CM | POA: Insufficient documentation

## 2020-10-07 DIAGNOSIS — Z7984 Long term (current) use of oral hypoglycemic drugs: Secondary | ICD-10-CM | POA: Insufficient documentation

## 2020-10-07 LAB — POCT GLYCOSYLATED HEMOGLOBIN (HGB A1C): HbA1c, POC (prediabetic range): 5.8 % (ref 5.7–6.4)

## 2020-10-07 LAB — GLUCOSE, POCT (MANUAL RESULT ENTRY): POC Glucose: 222 mg/dl — AB (ref 70–99)

## 2020-10-07 MED ORDER — PREGABALIN 75 MG PO CAPS
75.0000 mg | ORAL_CAPSULE | Freq: Two times a day (BID) | ORAL | 3 refills | Status: DC
Start: 2020-10-07 — End: 2022-02-05
  Filled 2020-10-07 – 2021-06-10 (×3): qty 60, 30d supply, fill #0
  Filled 2021-09-05: qty 60, 30d supply, fill #1

## 2020-10-07 MED ORDER — KNEE BRACE MISC: 0 refills | Status: DC

## 2020-10-07 MED ORDER — PREDNISONE 5 MG PO TABS
5.0000 mg | ORAL_TABLET | Freq: Every day | ORAL | 0 refills | Status: DC
  Filled 2020-10-07: qty 14, 14d supply, fill #0
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
## 2020-10-07 NOTE — Progress Notes (Signed)
po

## 2020-10-09 LAB — CBC
Hematocrit: 41.3 % (ref 37.5–51.0)
Hemoglobin: 14.2 g/dL (ref 13.0–17.7)
MCH: 30.7 pg (ref 26.6–33.0)
MCHC: 34.4 g/dL (ref 31.5–35.7)
MCV: 89 fL (ref 79–97)
Platelets: 277 10*3/uL (ref 150–450)
RBC: 4.63 x10E6/uL (ref 4.14–5.80)
RDW: 11.9 % (ref 11.6–15.4)
WBC: 6.7 10*3/uL (ref 3.4–10.8)

## 2020-10-09 LAB — CMP14+EGFR
ALT: 17 IU/L (ref 0–44)
AST: 29 IU/L (ref 0–40)
Albumin/Globulin Ratio: 1.1 — ABNORMAL LOW (ref 1.2–2.2)
Albumin: 4 g/dL (ref 3.8–4.8)
Alkaline Phosphatase: 97 IU/L (ref 44–121)
BUN/Creatinine Ratio: 11 (ref 10–24)
BUN: 8 mg/dL (ref 8–27)
Bilirubin Total: 0.7 mg/dL (ref 0.0–1.2)
CO2: 23 mmol/L (ref 20–29)
Calcium: 9.3 mg/dL (ref 8.6–10.2)
Chloride: 97 mmol/L (ref 96–106)
Creatinine, Ser: 0.76 mg/dL (ref 0.76–1.27)
Globulin, Total: 3.5 g/dL (ref 1.5–4.5)
Glucose: 166 mg/dL — ABNORMAL HIGH (ref 65–99)
Potassium: 3.7 mmol/L (ref 3.5–5.2)
Sodium: 134 mmol/L (ref 134–144)
Total Protein: 7.5 g/dL (ref 6.0–8.5)
eGFR: 102 mL/min/{1.73_m2} (ref >59)

## 2020-10-09 LAB — URIC ACID: Uric Acid: 4.6 mg/dL (ref 3.8–8.4)

## 2020-10-09 LAB — MICROALBUMIN / CREATININE URINE RATIO
Creatinine, Urine: 87.6 mg/dL
Microalb/Creat Ratio: 18 mg/g creat (ref 0–29)
Microalbumin, Urine: 15.8 ug/mL

## 2020-10-09 LAB — RHEUMATOID ARTHRITIS PROFILE
Cyclic Citrullin Peptide Ab: >250 units — ABNORMAL HIGH (ref 0–19)
Rheumatoid fact SerPl-aCnc: 121.5 IU/mL — ABNORMAL HIGH (ref <14.0)

## 2020-10-19 ENCOUNTER — Encounter: Payer: Self-pay | Admitting: Physical Therapy

## 2020-10-19 ENCOUNTER — Other Ambulatory Visit: Payer: Self-pay

## 2020-10-19 ENCOUNTER — Ambulatory Visit: Payer: Medicaid Other | Attending: Nurse Practitioner | Admitting: Physical Therapy

## 2020-10-19 DIAGNOSIS — R2689 Other abnormalities of gait and mobility: Secondary | ICD-10-CM | POA: Insufficient documentation

## 2020-10-19 DIAGNOSIS — M79605 Pain in left leg: Secondary | ICD-10-CM | POA: Insufficient documentation

## 2020-10-19 DIAGNOSIS — M6281 Muscle weakness (generalized): Secondary | ICD-10-CM | POA: Insufficient documentation

## 2020-10-19 NOTE — Patient Instructions (Signed)
Access Code: [email or web address] URL: [email or web address] Date: 10/19/2020 Prepared by: Shearon Balo  Exercises Seated Hamstring Curl with Anchored Resistance - 1 x daily - 7 x weekly - 2 sets - 10 reps Supine Bridge - 1 x daily - 7 x weekly - 2 sets - 10 reps Seated Sciatic Tensioner - 2 x daily - 7 x weekly - 15 reps

## 2020-10-19 NOTE — Therapy (Signed)
Tony Ortega, [place], [address] Phone: [phone]   Fax:  [phone]  Physical Therapy Treatment  Patient Details  Name: Tony Ortega MRN: [id number] Date of Birth: Feb 14, 1960 Referring Provider (PT): Gildardo Pounds, NP  Encounter Date: 10/19/2020   PT End of Session - 10/19/20 1212     Visit Number 1    Number of Visits 16    Date for PT Re-Evaluation 12/14/20    Authorization Type [place] MCD    PT Start Time 1110    PT Stop Time 1150    PT Time Calculation (min) 40 min    Activity Tolerance Patient tolerated treatment well    Behavior During Therapy [facility] for tasks assessed/performed             Past Medical History:  Diagnosis Date   Diabetes mellitus without complication ([place])    Dyspnea    Hypertension    Neuropathy    Rheumatoid arthritis ([place])    Stroke ([place]) 09/2018   patient thanks he had a stroke in Jan.2022 but no test was done    Past Surgical History:  Procedure Laterality Date   ANTERIOR CERVICAL DECOMP/DISCECTOMY FUSION N/A 03/20/2019   Procedure: Cervical four-five Cervical six-seven  Anterior cervical decompression/discectomy/fusion;  Surgeon: Kristeen Miss, MD;  Location: [place];  Service: Neurosurgery;  Laterality: N/A;   KNEE SURGERY Right     There were no vitals filed for this visit.   Subjective Assessment - 10/19/20 1218     Subjective Tony Ortega is a 60 y.o. male who presents to clinic with chief complaint of L leg weakness which leads to falls.  MOI/History of condition: Pt reports that he has had issues with his L leg "giving out" for at least 2 years.  He had a cane which he lost but was helpful.  He has been on prednisone for 20+ years but denies fracture.  Pain location: posterior of L LE.  Red flags: denies saddle anesthesia, bb changes.  48 hour pain intensity:  highest 7-8/10, current 7-8/10/10, best 7/10.  Aggs: standing, walking, not taking medication.   Eases: laying on back with leg slightly elevated.  Nature: nerve pain, aching, burning.  Severity: high.  Irritability: mod.  Stage: chronic.  Stability: getting worse.  24 hour pattern: no clear pattern.  Vocation/requirements: not working.  Functional limitations/goals: reduce fall risk, walk for longer, stand for longer.  Home environment: lives with mother, 0 stairs.  Assistive device: SPC.   Hand dominance: L.  Falls: 4 falls in last 6 months, "L leg gave out".    Pertinent History Significant PMH: diabetes, RA, stroke? in 2020, ACDF 2021, diabetic neuropathy, long term use of prednisone (20+ years), FALL RISK                [facility] PT Assessment - 10/19/20 0001       Assessment   Medical Diagnosis Referral diagnosis: Abnormal gait (R26.9)    Referring Provider (PT) Gildardo Pounds, NP    Onset Date/Surgical Date 10/19/17    Hand Dominance Left    Next MD Visit unknown    Prior Therapy yes ~6 months ago      Precautions   Precaution Comments Significant PMH: diabetes, RA, stroke? in 2020, ACDF 2021, diabetic neuropathy, long term use of prednisone (20+ years), FALL RISK      Restrictions   Other Position/Activity Restrictions none      Balance Screen  Has the patient fallen in the past 6 months Yes    How many times? 4    Has the patient had a decrease in activity level because of a fear of falling?  No    Is the patient reluctant to leave their home because of a fear of falling?  No      Prior Function   Level of Independence Independent      Observation/Other Assessments   Observations antalgic gait, reduced knee flexion L, reduced step length L, R w/s during ambulation, unstable in gait; trophic changes bil feet      Sensation   Light Touch Appears Intact      Functional Tests   Functional tests Sit to Stand;Other;Other2      Sit to Stand   Comments next visit      Other:   Other/ Comments 10 m max gait speed: .55 m/s      Other:   Other/Comments  Sustained supine bridge (dominant leg extended at 120'', if reached): 45'' (norm 170'')      ROM / Strength   AROM / PROM / Strength Strength      Strength   Strength Assessment Site Hip;Knee;Ankle    Right Hip Flexion 4/5    Right Hip Extension 3/5    Right Hip ABduction 3/5    Left Hip Flexion 3/5    Left Hip Extension 3/5    Left Hip ABduction 3/5    Right Knee Flexion 4/5    Right Knee Extension 4/5    Left Knee Flexion 3/5    Left Knee Extension 4/5    Right Ankle Dorsiflexion 4/5    Right Ankle Plantar Flexion 4/5    Left Ankle Dorsiflexion 3/5    Left Ankle Plantar Flexion 3/5      Special Tests   Other special tests slump (+) L                                    PT Education - 10/19/20 1209     Education Details POC, diagnosis, prognosis, HEP.  Pt educated via explanation, demonstration, and handout (HEP).  Pt confirms understanding verbally.              PT Short Term Goals - 10/19/20 1216       PT SHORT TERM Ortega #1   Title Tony Ortega will be >75% HEP compliant throughout therapy to improve carryover between sessions and facilitate independent management of condition.    Target Date 11/09/20               PT Long Term Goals - 10/19/20 1216       PT LONG TERM Ortega #1   Title Tony Ortega will be able to maintain supine bridge for 90'' (dominant leg extended if 120'' reached) as evidence of improved hip extension and core strength (norm for healthy adult ~170'')  EVAL: 75''  target date: 12/14/2020    Baseline 45''    Status New    Target Date 12/14/20      PT LONG TERM Ortega #2   Title Tony Ortega will improve 10 meter max gait speed to .7 m/s (.1 m/s MCID) to show functional improvement in ambulation  EVAL: .55 m/s  target date: 12/14/2020    Baseline .55 m/s    Status New    Target Date 12/14/20  PT LONG TERM Ortega #3   Title Tony Ortega will be able to walk for 60 min, not limited by pain   EVAL: limited by pain  target date: 12/14/2020    Baseline limited by pain    Status New    Target Date 12/14/20      PT LONG TERM Ortega #4   Title Tony Ortega    Target Date 12/14/20                   Plan - 10/19/20 1212     Clinical Impression Statement Tony Ortega is a 60 y.o. male who presents to clinic with signs and sxs consistent with L LE pain and weakness secondary to radicular pain.  Unclear if weakness in L LE is neurological; given global nature this is less likely.  Pt presents with pain and impairments/deficits in: L LE strength, dural tension, balance, gait, transfers.  Activity limitations include: ambulation over longer distances, stairs, standing for longer periods.  Participation limitations include: recreation, housework.  Pt will benefit from skilled therapy to address pain and the listed deficits in order to achieve functional goals, enable safety and independence in completion of daily tasks, and return to PLOF.    Stability/Clinical Decision Making Stable/Uncomplicated    Clinical Decision Making Low    Rehab Potential Fair    PT Frequency 2x / week    PT Duration 8 weeks    PT Treatment/Interventions ADLs/Self Care Home Management;Aquatic Therapy;Iontophoresis '4mg'$ /ml Dexamethasone;Gait training;Therapeutic activities;Therapeutic exercise;Neuromuscular re-education;Manual techniques;Dry needling    PT Next Visit Plan take 30'' STS and create Ortega, LE strengthening, dural mobility, gait training, core strengthening progression    PT Home Exercise Plan [phone]    Consulted and Agree with Plan of Care Patient             Patient will benefit from skilled therapeutic intervention in order to improve the following deficits and impairments:  Abnormal gait, Pain, Decreased strength, Decreased activity tolerance, Difficulty walking, Decreased mobility, Decreased endurance, Decreased balance  Visit Diagnosis: Muscle weakness  Pain in left  leg  Other abnormalities of gait and mobility     Problem List Patient Active Problem List   Diagnosis Date Noted   Long term systemic steroid user 04/12/2019   Cervical disc disease with myelopathy 03/20/2019   Elevated blood-pressure reading, without diagnosis of hypertension 03/01/2019   Long-term use of high-risk medication 02/01/2019   At high risk for injury related to fall 12/13/2018   Lumbar radiculopathy, chronic 12/13/2018   Cervical myelopathy ([place]) 09/07/2018   Hyperlipidemia associated with type 2 diabetes mellitus ([place]) 06/22/2018   Long term current use of aspirin 06/22/2018   Gastroesophageal reflux disease 06/22/2018   History of CVA (cerebrovascular accident) [id number]   History of Helicobacter pylori infection 06/22/2018   Paresthesia 02/24/2018   Gait abnormality 02/24/2018   Weakness 02/24/2018   Tobacco use disorder 02/15/2018   Essential hypertension 02/15/2018   Type 2 diabetes, controlled, with peripheral neuropathy ([place]) 02/15/2018   Rheumatoid arthritis ([place]) 02/15/2018    Mathis Dad, PT 10/19/2020, 12:21 PM  [place] [facility] [address] [place], [place], [address] Phone: [phone]   Fax:  [phone]  Name: Tony Ortega MRN: [id number] Date of Birth: July 26, 1960

## 2020-10-23 ENCOUNTER — Telehealth: Payer: Self-pay

## 2020-10-23 NOTE — Telephone Encounter (Signed)
Pt was called and informed of lab results. 

## 2020-10-28 ENCOUNTER — Other Ambulatory Visit: Payer: Self-pay

## 2020-10-28 ENCOUNTER — Encounter: Payer: Self-pay | Admitting: Physical Therapy

## 2020-10-28 ENCOUNTER — Other Ambulatory Visit: Payer: Self-pay | Admitting: Nurse Practitioner

## 2020-10-28 ENCOUNTER — Ambulatory Visit: Payer: Medicaid Other | Admitting: Physical Therapy

## 2020-10-28 DIAGNOSIS — M79605 Pain in left leg: Secondary | ICD-10-CM

## 2020-10-28 DIAGNOSIS — I10 Essential (primary) hypertension: Secondary | ICD-10-CM

## 2020-10-28 DIAGNOSIS — M6281 Muscle weakness (generalized): Secondary | ICD-10-CM | POA: Diagnosis not present

## 2020-10-28 DIAGNOSIS — R2689 Other abnormalities of gait and mobility: Secondary | ICD-10-CM

## 2020-10-28 MED ORDER — HYDROCHLOROTHIAZIDE 25 MG PO TABS
ORAL_TABLET | Freq: Every day | ORAL | 2 refills | Status: DC
Start: 2020-10-28 — End: 2021-02-17
  Filled 2020-10-28: qty 90, 90d supply, fill #0

## 2020-10-28 MED ORDER — AMLODIPINE BESYLATE 10 MG PO TABS
ORAL_TABLET | Freq: Every day | ORAL | 0 refills | Status: DC
  Filled 2020-10-28: qty 90, 90d supply, fill #0

## 2020-10-28 NOTE — Patient Instructions (Signed)
Access Code: [email or web address] URL: [email or web address] Date: 10/28/2020 Prepared by: Hessie Diener  Exercises Seated Hamstring Curl with Anchored Resistance - 1 x daily - 7 x weekly - 2 sets - 10 reps Supine Bridge with Resistance Band - 1 x daily - 7 x weekly - 2 sets - 10 reps Seated Sciatic Tensioner - 2 x daily - 7 x weekly - 15 reps Supine March with Posterior Pelvic Tilt - 1 x daily - 7 x weekly - 2 sets - 10 reps
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
## 2020-10-29 ENCOUNTER — Other Ambulatory Visit: Payer: Self-pay

## 2020-10-30 ENCOUNTER — Other Ambulatory Visit: Payer: Self-pay

## 2020-10-30 ENCOUNTER — Ambulatory Visit: Payer: Medicaid Other | Admitting: Physical Therapy

## 2020-11-04 ENCOUNTER — Ambulatory Visit: Payer: Medicaid Other | Admitting: Physical Therapy

## 2020-11-04 ENCOUNTER — Other Ambulatory Visit: Payer: Self-pay

## 2020-11-04 VITALS — BP 124/81 | HR 68

## 2020-11-04 DIAGNOSIS — M6281 Muscle weakness (generalized): Secondary | ICD-10-CM

## 2020-11-04 DIAGNOSIS — R2689 Other abnormalities of gait and mobility: Secondary | ICD-10-CM

## 2020-11-04 DIAGNOSIS — M79605 Pain in left leg: Secondary | ICD-10-CM

## 2020-11-04 NOTE — Therapy (Signed)
[place], [place], [address] Phone: [phone]   Fax:  [phone]  Physical Therapy Treatment  Patient Details  Name: Tony Ortega MRN: [id number] Date of Birth: [birth year] Referring Provider (PT): Gildardo Pounds, NP   Encounter Date: 11/04/2020   PT End of Session - 11/04/20 1238     Visit Number 3    Number of Visits 16    Date for PT Re-Evaluation 12/14/20    Authorization Type  MCD -    Authorization Time Period 10/28/20-11/10/20    Authorization - Visit Number 2    Authorization - Number of Visits 3    PT Start Time 1232    PT Stop Time 1093    PT Time Calculation (min) 43 min             Past Medical History:  Diagnosis Date   Diabetes mellitus without complication ([place])    Dyspnea    Hypertension    Neuropathy    Rheumatoid arthritis ([place])    Stroke ([place]) 09/2018   patient thanks he had a stroke in Jan.2022 but no test was done    Past Surgical History:  Procedure Laterality Date   ANTERIOR CERVICAL DECOMP/DISCECTOMY FUSION N/A 03/20/2019   Procedure: Cervical four-five Cervical six-seven  Anterior cervical decompression/discectomy/fusion;  Surgeon: Kristeen Miss, MD;  Location: [facility];  Service: Neurosurgery;  Laterality: N/A;   KNEE SURGERY Right     Vitals:   11/04/20 1234  BP: 124/81  Pulse: 68  SpO2: 97%     Subjective Assessment - 11/04/20 1234     Subjective I did a few of the exercises but i have been having BP issues.    Currently in Pain? Yes    Pain Score 10-Worst pain ever    Pain Location Generalized    Pain Orientation --   everywhere today   Pain Descriptors / Indicators Aching;Throbbing    Pain Type Chronic pain    Aggravating Factors  standing walking    Pain Relieving Factors laying  on back with legs slightly elevated             Therapeutic Exercise:  - Bridge x 10 with 5 sec hold  -LTR x 10  - S/L clam x 10 each -supine knee to  opposite shoulder stretch 10 sec x 3 each  -PPT - 5 sec x 10 -mod cues  -PPT with marching  -SLR -cues to maintain neutral spine- 5 x 2 each - improved ability to maintain neutral  - seated h/s curls 10 x 2 red (used towel slide on left ) -seated sciatic tensioner x 15  -sit-stand x 10 -hands on thighs , cues for controlled descent       PT Short Term Goals - 10/19/20 1216       PT SHORT TERM GOAL #1   Title Tony Ortega will be >75% HEP compliant throughout therapy to improve carryover between sessions and facilitate independent management of condition.    Target Date 11/09/20               PT Long Term Goals - 10/19/20 1216       PT LONG TERM GOAL #1   Title Tony Ortega will be able to maintain supine bridge for 90'' (dominant leg extended if 120'' reached) as evidence of improved hip extension and core strength (norm for healthy adult ~170'')  EVAL: 75''  target date: 12/14/2020  Baseline 45''    Status New    Target Date 12/14/20      PT LONG TERM GOAL #2   Title Tony Ortega will improve 10 meter max gait speed to .7 m/s (.1 m/s MCID) to show functional improvement in ambulation  EVAL: .55 m/s  target date: 12/14/2020    Baseline .55 m/s    Status New    Target Date 12/14/20      PT LONG TERM GOAL #3   Title Tony Ortega will be able to walk for 60 min, not limited by pain  EVAL: limited by pain  target date: 12/14/2020    Baseline limited by pain    Status New    Target Date 12/14/20      PT LONG TERM GOAL #4   Title 57'' STS goal    Target Date 12/14/20                   Plan - 11/04/20 1241     Clinical Impression Statement Tony Ortega arrives without AD and reports he forgot it at home. He reports elevated BP lately and has not been as compliant with HEP. His BP today is normal in clinic. Continued with HEP review and progression of core and hip strength. He has difficulty with seated hamstring curls with left LE and this  was modified to seated heel slide with towel and tolerated well.    PT Treatment/Interventions ADLs/Self Care Home Management;Aquatic Therapy;Iontophoresis 4mg /ml Dexamethasone;Gait training;Therapeutic activities;Therapeutic exercise;Neuromuscular re-education;Manual techniques;Dry needling    PT Next Visit Plan submit for medicaid visits; create goal for 30" STS (completed last visit) , LE strengthening, dural mobility, gait training, core strengthening progression;    PT Home Exercise Plan [phone]             Patient will benefit from skilled therapeutic intervention in order to improve the following deficits and impairments:  Abnormal gait, Pain, Decreased strength, Decreased activity tolerance, Difficulty walking, Decreased mobility, Decreased endurance, Decreased balance  Visit Diagnosis: Muscle weakness  Pain in left leg  Other abnormalities of gait and mobility     Problem List Patient Active Problem List   Diagnosis Date Noted   Long term systemic steroid user 04/12/2019   Cervical disc disease with myelopathy 03/20/2019   Elevated blood-pressure reading, without diagnosis of hypertension 03/01/2019   Long-term use of high-risk medication 02/01/2019   At high risk for injury related to fall 12/13/2018   Lumbar radiculopathy, chronic 12/13/2018   Cervical myelopathy ([place]) 09/07/2018   Hyperlipidemia associated with type 2 diabetes mellitus ([place]) 06/22/2018   Long term current use of aspirin 06/22/2018   Gastroesophageal reflux disease 06/22/2018   History of CVA (cerebrovascular accident) 18/84/1660   History of Helicobacter pylori infection 06/22/2018   Paresthesia 02/24/2018   Gait abnormality 02/24/2018   Weakness 02/24/2018   Tobacco use disorder 02/15/2018   Essential hypertension 02/15/2018   Type 2 diabetes, controlled, with peripheral neuropathy ([place]) 02/15/2018   Rheumatoid arthritis ([place]) 02/15/2018    [name], PTA 11/04/2020, 1:53  PM  [facility] [address] [place], [place], [address] Phone: [phone]   Fax:  [phone]  Name: Tony Ortega MRN: [id number] Date of Birth: September 10, 1960

## 2020-11-06 ENCOUNTER — Other Ambulatory Visit: Payer: Self-pay

## 2020-11-06 ENCOUNTER — Ambulatory Visit: Payer: Medicaid Other | Admitting: Physical Therapy

## 2020-11-06 ENCOUNTER — Encounter: Payer: Self-pay | Admitting: Physical Therapy

## 2020-11-06 DIAGNOSIS — M79605 Pain in left leg: Secondary | ICD-10-CM

## 2020-11-06 DIAGNOSIS — M6281 Muscle weakness (generalized): Secondary | ICD-10-CM | POA: Diagnosis not present

## 2020-11-06 DIAGNOSIS — R2689 Other abnormalities of gait and mobility: Secondary | ICD-10-CM

## 2020-11-06 NOTE — Therapy (Signed)
[place], [place], [address] Phone: [phone]   Fax:  [phone]  Physical Therapy Treatment  Patient Details  Name: Tony Ortega MRN: [id number] Date of Birth: 1960-02-28 Referring Provider (PT): Gildardo Pounds, NP   Encounter Date: 11/06/2020   PT End of Session - 11/06/20 1252     Visit Number 4    Number of Visits 16    Date for PT Re-Evaluation 12/14/20    Authorization Type [place] [facility] -    Authorization Time Period 10/28/20-11/10/20    Authorization - Visit Number 3    Authorization - Number of Visits 3    PT Start Time [place] During Treatment Gait belt    Activity Tolerance Patient tolerated treatment well    Behavior During Therapy [facility] for tasks assessed/performed             Past Medical History:  Diagnosis Date   Diabetes mellitus without complication ([place])    Dyspnea    Hypertension    Neuropathy    Rheumatoid arthritis ([place])    Stroke ([place]) 09/2018   patient thanks he had a stroke in Jan.2022 but no test was done    Past Surgical History:  Procedure Laterality Date   ANTERIOR CERVICAL DECOMP/DISCECTOMY FUSION N/A 03/20/2019   Procedure: Cervical four-five Cervical six-seven  Anterior cervical decompression/discectomy/fusion;  Surgeon: Kristeen Miss, MD;  Location: [place];  Service: Neurosurgery;  Laterality: N/A;   KNEE SURGERY Right     There were no vitals filed for this visit.   Subjective Assessment - 11/06/20 1251     Subjective I did my exercises last night. I forgot my cane again.    Pertinent History Significant PMH: diabetes, RA, stroke? in 2020, ACDF 2021, diabetic neuropathy, long term use of prednisone (20+ years), FALL RISK    Currently in Pain? Yes    Pain Score 10-Worst pain ever   it never changes   Pain Location Generalized    Pain Orientation --   everywhere   Pain Descriptors / Indicators Aching;Throbbing    Pain Type Chronic pain     Aggravating Factors  standing and walking    Pain Relieving Factors laying on back with legs slightly elevated                [facility] PT Assessment - 11/06/20 0001       Sit to Stand   Comments 5 reps in 30 sec from mat table with UE assist      Other:   Other/ Comments 10 m max gait speed: .56 m/s with SPC   17.8 s / 41m    Other:   Other/Comments sustained bridge 43 seconds      ROM / Strength   AROM / PROM / Strength Strength      Strength   Strength Assessment Site Lumbar    Right Hip Flexion 4+/5    Right Hip Extension 3/5    Right Hip ABduction 3/5    Left Hip Flexion 4-/5    Left Hip Extension 3/5    Left Hip ABduction 3/5    Lumbar Flexion 3/5             Therapeutic Exercise:  - Bridge with red resistance band and isometric hip ER hold x 10 with 5 sec hold  -LTR x 10  - S/L clam x 12 each -supine knee to opposite shoulder stretch 10 sec  x 3 each  (not today) -PPT with marching  and red band at thighs -SLR -cues to maintain neutral spine- 5 x 2 each - improved ability to maintain neutral  (not today)  - seated h/s curls 10 x 2 red (used towel slide on left ) (not today)  -sit-stand x 10 -needs UE assist  , cues for controlled descent  -seated sciatic tensioner (not today)        PT Short Term Goals - 11/06/20 1252       PT SHORT TERM GOAL #1   Title Tony Ortega will be >75% HEP compliant throughout therapy to improve carryover between sessions and facilitate independent management of condition.    Baseline Pt reports daily compliance with HEP and demonstrates independence    Status Achieved    Target Date 11/09/20               PT Long Term Goals - 11/06/20 1309       PT LONG TERM GOAL #1   Title Tony Ortega will be able to maintain supine bridge for 90'' (dominant leg extended if 120'' reached) as evidence of improved hip extension and core strength (norm for healthy adult ~170'')  EVAL: 45''  target date: 12/14/2020     Baseline 45''; 11/06/20: 43 seconds    Time 6    Period Weeks    Status On-going      PT LONG TERM GOAL #2   Title Tony Ortega will improve 10 meter max gait speed to .7 m/s (.1 m/s MCID) to show functional improvement in ambulation  EVAL: .55 m/s  target date: 12/14/2020    Baseline .55 m/s; 11/06/20: .56 m/s    Time 6    Period Weeks    Status On-going      PT LONG TERM GOAL #3   Title Tony Ortega will be able to walk for 60 min, not limited by pain  EVAL: limited by pain  target date: 12/14/2020    Baseline limited by pain; 11/06/20: limited by pain- can make it across parking lot before left leg gives out.    Time 6    Period Weeks    Status On-going      PT LONG TERM GOAL #4   Title 30'' STS improved to 10 reps    Baseline 7 reps with UE; 11/06/20: 5 reps with UE    Time 6    Period Weeks    Status On-going                   Plan - 11/06/20 1317     Clinical Impression Statement Tony Ortega arrived without AD. He is here for his third treatment session and has been consistent with attendance. He reports compliance with his initial HEP and demonstrates independence. He has met STG#1. He has made improvement in his hip flexion MMT. Continued with core and hip strength with patient reporting fatigue with repetitions.    PT Frequency 2x / week    PT Treatment/Interventions ADLs/Self Care Home Management;Aquatic Therapy;Iontophoresis 51m/ml Dexamethasone;Gait training;Therapeutic activities;Therapeutic exercise;Neuromuscular re-education;Manual techniques;Dry needling    PT Next Visit Plan Re-eval for additional visits. LE strengthening, dural mobility, gait training, core strengthening progression;    PT Home Exercise Plan [phone]   Consulted and Agree with Plan of Care Patient             Patient will benefit from skilled therapeutic intervention in order to improve  the following deficits and impairments:  Abnormal gait, Pain, Decreased strength,  Decreased activity tolerance, Difficulty walking, Decreased mobility, Decreased endurance, Decreased balance  Visit Diagnosis: Muscle weakness  Pain in left leg  Other abnormalities of gait and mobility     Problem List Patient Active Problem List   Diagnosis Date Noted   Long term systemic steroid user 04/12/2019   Cervical disc disease with myelopathy 03/20/2019   Elevated blood-pressure reading, without diagnosis of hypertension 03/01/2019   Long-term use of high-risk medication 02/01/2019   At high risk for injury related to fall 12/13/2018   Lumbar radiculopathy, chronic 12/13/2018   Cervical myelopathy ([place]) 09/07/2018   Hyperlipidemia associated with type 2 diabetes mellitus ([place]) 06/22/2018   Long term current use of aspirin 06/22/2018   Gastroesophageal reflux disease 06/22/2018   History of CVA (cerebrovascular accident) 00/16/4290   History of Helicobacter pylori infection 06/22/2018   Paresthesia 02/24/2018   Gait abnormality 02/24/2018   Weakness 02/24/2018   Tobacco use disorder 02/15/2018   Essential hypertension 02/15/2018   Type 2 diabetes, controlled, with peripheral neuropathy ([place]) 02/15/2018   Rheumatoid arthritis ([place]) 02/15/2018    [name], PTA 11/06/2020, 1:36 PM  [facility] [address] [place], [place], [address] Phone: [phone]   Fax:  [phone]  Name: VANG KRAEGER MRN: [id number] Date of Birth: October 03, 1960

## 2020-11-08 ENCOUNTER — Ambulatory Visit: Payer: Medicaid Other | Admitting: Podiatry

## 2020-11-11 ENCOUNTER — Ambulatory Visit: Payer: Medicaid Other | Admitting: Physical Therapy

## 2020-11-12 ENCOUNTER — Ambulatory Visit: Payer: Medicaid Other | Attending: Nurse Practitioner | Admitting: Physical Therapy

## 2020-11-12 ENCOUNTER — Telehealth: Payer: Self-pay | Admitting: Physical Therapy

## 2020-11-12 DIAGNOSIS — M6281 Muscle weakness (generalized): Secondary | ICD-10-CM | POA: Insufficient documentation

## 2020-11-12 DIAGNOSIS — M79605 Pain in left leg: Secondary | ICD-10-CM | POA: Insufficient documentation

## 2020-11-12 DIAGNOSIS — R2689 Other abnormalities of gait and mobility: Secondary | ICD-10-CM | POA: Insufficient documentation

## 2020-11-12 NOTE — Telephone Encounter (Signed)
Called and informed patient of missed visit and provided reminder of next appt and attendance policy.  

## 2020-11-12 NOTE — Progress Notes (Deleted)
Office Visit Note  Patient: Tony Ortega             Date of Birth: 1960/05/21           MRN: [id number]             PCP: Gildardo Pounds, NP Referring: Gildardo Pounds, NP Visit Date: 11/13/2020 Occupation: @GUAROCC @  Subjective:  No chief complaint on file.   History of Present Illness: Tony Ortega is a 60 y.o. male here for history of seropositive rheumatoid arthritis on long term prednisone treatment.***   Labs reviewed 09/2020 RF 121.5 CCP >250 Uric acid 4.6 CBC wnl CMP unremarkable   Activities of Daily Living:  Patient reports morning stiffness for *** {minute/hour:19697}.   Patient {ACTIONS;DENIES/REPORTS:21021675::"Denies"} nocturnal pain.  Difficulty dressing/grooming: {ACTIONS;DENIES/REPORTS:21021675::"Denies"} Difficulty climbing stairs: {ACTIONS;DENIES/REPORTS:21021675::"Denies"} Difficulty getting out of chair: {ACTIONS;DENIES/REPORTS:21021675::"Denies"} Difficulty using hands for taps, buttons, cutlery, and/or writing: {ACTIONS;DENIES/REPORTS:21021675::"Denies"}  No Rheumatology ROS completed.   PMFS History:  Patient Active Problem List   Diagnosis Date Noted   Long term systemic steroid user 04/12/2019   Cervical disc disease with myelopathy 03/20/2019   Elevated blood-pressure reading, without diagnosis of hypertension 03/01/2019   Long-term use of high-risk medication 02/01/2019   At high risk for injury related to fall 12/13/2018   Lumbar radiculopathy, chronic 12/13/2018   Cervical myelopathy ([place]) 09/07/2018   Hyperlipidemia associated with type 2 diabetes mellitus ([place]) 06/22/2018   Long term current use of aspirin 06/22/2018   Gastroesophageal reflux disease 06/22/2018   History of CVA (cerebrovascular accident) 32/35/5732   History of Helicobacter pylori infection 06/22/2018   Paresthesia 02/24/2018   Gait abnormality 02/24/2018   Weakness 02/24/2018   Tobacco use disorder 02/15/2018   Essential hypertension 02/15/2018   Type 2  diabetes, controlled, with peripheral neuropathy ([place]) 02/15/2018   Rheumatoid arthritis ([place]) 02/15/2018    Past Medical History:  Diagnosis Date   Diabetes mellitus without complication ([place])    Dyspnea    Hypertension    Neuropathy    Rheumatoid arthritis ([place])    Stroke ([place]) 09/2018   patient thanks he had a stroke in Jan.2022 but no test was done    Family History  Problem Relation Age of Onset   Diabetes Mother    Hypertension Mother    Rheum arthritis Mother    Other Father        unsure - thinks he may have history of cancer   CAD Neg Hx    Colon cancer Neg Hx    Colon polyps Neg Hx    Esophageal cancer Neg Hx    Rectal cancer Neg Hx    Stomach cancer Neg Hx    Past Surgical History:  Procedure Laterality Date   ANTERIOR CERVICAL DECOMP/DISCECTOMY FUSION N/A 03/20/2019   Procedure: Cervical four-five Cervical six-seven  Anterior cervical decompression/discectomy/fusion;  Surgeon: Kristeen Miss, MD;  Location: [place];  Service: Neurosurgery;  Laterality: N/A;   KNEE SURGERY Right    Social History   Social History Narrative   Lives at home with mother.   Left-handed.   He released was released from prison in March 2019.  He served 32 years.   2 cups caffeine per day.       There is no immunization history on file for this patient.   Objective: Vital Signs: There were no vitals taken for this visit.   Physical Exam   Musculoskeletal Exam: ***  CDAI Exam: CDAI Score: -- Patient Global: --; Provider  Global: -- Swollen: --; Tender: -- Joint Exam 11/13/2020   No joint exam has been documented for this visit   There is currently no information documented on the homunculus. Go to the Rheumatology activity and complete the homunculus joint exam.  Investigation: No additional findings.  Imaging: No results found.  Recent Labs: Lab Results  Component Value Date   WBC 6.7 10/07/2020   HGB 14.2 10/07/2020   PLT 277 10/07/2020   NA 134 10/07/2020   K  3.7 10/07/2020   CL 97 10/07/2020   CO2 23 10/07/2020   GLUCOSE 166 (H) 10/07/2020   BUN 8 10/07/2020   CREATININE 0.76 10/07/2020   BILITOT 0.7 10/07/2020   ALKPHOS 97 10/07/2020   AST 29 10/07/2020   ALT 17 10/07/2020   PROT 7.5 10/07/2020   ALBUMIN 4.0 10/07/2020   CALCIUM 9.3 10/07/2020   GFRAA 103 03/13/2020    Speciality Comments: No specialty comments available.  Procedures:  No procedures performed Allergies: Pork-derived products   Assessment / Plan:     Visit Diagnoses: No diagnosis found.  Orders: No orders of the defined types were placed in this encounter.  No orders of the defined types were placed in this encounter.   Face-to-face time spent with patient was *** minutes. Greater than 50% of time was spent in counseling and coordination of care.  Follow-Up Instructions: No follow-ups on file.   Collier Salina, MD  Note - This record has been created using [employer].  Chart creation errors have been sought, but may not always  have been located. Such creation errors do not reflect on  the standard of medical care.

## 2020-11-13 ENCOUNTER — Ambulatory Visit: Payer: Medicaid Other | Admitting: Internal Medicine

## 2020-11-14 ENCOUNTER — Other Ambulatory Visit: Payer: Self-pay

## 2020-11-14 ENCOUNTER — Ambulatory Visit: Payer: Medicaid Other | Admitting: Physical Therapy

## 2020-11-14 ENCOUNTER — Encounter: Payer: Self-pay | Admitting: Physical Therapy

## 2020-11-14 DIAGNOSIS — R2689 Other abnormalities of gait and mobility: Secondary | ICD-10-CM

## 2020-11-14 DIAGNOSIS — M79605 Pain in left leg: Secondary | ICD-10-CM | POA: Diagnosis present

## 2020-11-14 DIAGNOSIS — M6281 Muscle weakness (generalized): Secondary | ICD-10-CM

## 2020-11-14 NOTE — Therapy (Addendum)
[place], [place], [address] Phone: [phone]   Fax:  [phone]  PHYSICAL THERAPY UNPLANNED DISCHARGE SUMMARY   Visits from Start of Care: 5  Current functional level related to goals / functional outcomes: Current status unknown   Remaining deficits: Current status unknown   Education / Equipment: Pt has not returned since visit listed below  Patient goals were not assessed. Patient is being discharged due to not returning since the last visit.  (the below note was addended to include the above D/C summary on 12/17/20)  Physical Therapy Treatment  Patient Details  Name: Tony Ortega MRN: [id number] Date of Birth: 1960/09/19 Referring Provider (PT): Gildardo Pounds, NP   Encounter Date: 11/14/2020   PT End of Session - 11/14/20 1214     Visit Number 5    Number of Visits 16    Date for PT Re-Evaluation 12/14/20    Authorization Type [place] [facility]CD -    Authorization Time Period 10/28/20-11/10/20    Authorization - Visit Number 4    Authorization - Number of Visits 4    PT Start Time 1215    PT Stop Time 1300    PT Time Calculation (min) 45 min    Equipment Utilized During Treatment Gait belt    Activity Tolerance Patient tolerated treatment well    Behavior During Therapy WFL for tasks assessed/performed             Past Medical History:  Diagnosis Date   Diabetes mellitus without complication ([place])    Dyspnea    Hypertension    Neuropathy    Rheumatoid arthritis ([place])    Stroke ([place]) 09/2018   patient thanks he had a stroke in Jan.2022 but no test was done    Past Surgical History:  Procedure Laterality Date   ANTERIOR CERVICAL DECOMP/DISCECTOMY FUSION N/A 03/20/2019   Procedure: Cervical four-five Cervical six-seven  Anterior cervical decompression/discectomy/fusion;  Surgeon: Kristeen Miss, MD;  Location: [place];  Service: Neurosurgery;  Laterality: N/A;   KNEE SURGERY Right      There were no vitals filed for this visit.   Subjective Assessment - 11/14/20 1220     Subjective Pt reports that since starting therapy he feels like he is stronger and less likely to fall.  He feels like his "muscles are coming back".  9-10/10 low back pain and leg pain.    Pertinent History Significant PMH: diabetes, RA, stroke? in 2020, ACDF 2021, diabetic neuropathy, long term use of prednisone (20+ years), FALL RISK             PT Re-eval (checking goals and progress)  Therapeutic Exercise:   - nu-step L7 94m - Supine clam x 12  -PPT with marching  and red band at thighs      PT Short Term Goals - 11/06/20 1252       PT SHORT TERM GOAL #1   Title Tony Ortega will be >75% HEP compliant throughout therapy to improve carryover between sessions and facilitate independent management of condition.    Baseline Pt reports daily compliance with HEP and demonstrates independence    Status Achieved    Target Date 11/09/20               PT Long Term Goals - 11/14/20 1225       PT LONG TERM GOAL #1   Title Tony Ortega will be able to maintain supine bridge for 90'' (  dominant leg extended if 120'' reached) as evidence of improved hip extension and core strength (norm for healthy adult ~170'')  EVAL: 45''  target date: 12/14/2020    Baseline 45''; 11/06/20: 43 seconds 11/14/20: 60''    Time 6    Period Weeks    Status On-going      PT LONG TERM GOAL #2   Title Tony Ortega will improve 10 meter max gait speed to .7 m/s (.1 m/s MCID) to show functional improvement in ambulation  EVAL: .55 m/s  target date: 12/14/2020    Baseline .55 m/s; 11/06/20: .56 m/s 10/6: .59 m/s    Time 6    Period Weeks    Status On-going      PT LONG TERM GOAL #3   Title Tony Ortega will be able to walk for 60 min, not limited by pain  EVAL: limited by pain  target date: 12/14/2020    Baseline limited by pain; 11/06/20: limited by pain- can make it across parking lot  before left leg gives out. 10/6: 1 minute    Time 6    Period Weeks    Status On-going      PT LONG TERM GOAL #4   Title 30'' STS improved to 10 reps    Baseline 7 reps with UE; 11/06/20: 5 reps with UE 11/14/2020: 5x w/ UE support    Time 6    Period Weeks    Status On-going                   Plan - 11/14/20 1245     Clinical Impression Statement Tony Ortega has progressed fair with therapy.  Improved impairments include: LE, hip and core strength, slight improvement in gait speed,  Functional improvements include: ability to ambulate with increased confidence.  Progressions needed include: continued core/LE/and hip strengthening, work on balance and gait as appropriate.  Barriers to progress include: RA and history of chronic pain.  Please see baseline and/or status section in "Goals" for specific progress on short term and long term goals established at evaluation.  I recommend continuation of PT to allow completion of remaining goals and continued functional progression.    PT Frequency 2x / week    PT Treatment/Interventions ADLs/Self Care Home Management;Aquatic Therapy;Iontophoresis 4mg /ml Dexamethasone;Gait training;Therapeutic activities;Therapeutic exercise;Neuromuscular re-education;Manual techniques;Dry needling    PT Next Visit Plan Re-eval for additional visits. LE strengthening, dural mobility, gait training, core strengthening progression;    PT Home Exercise Plan [phone]    Consulted and Agree with Plan of Care Patient             Patient will benefit from skilled therapeutic intervention in order to improve the following deficits and impairments:  Abnormal gait, Pain, Decreased strength, Decreased activity tolerance, Difficulty walking, Decreased mobility, Decreased endurance, Decreased balance  Visit Diagnosis: Muscle weakness  Pain in left leg  Other abnormalities of gait and mobility     Problem List Patient Active Problem List   Diagnosis  Date Noted   Long term systemic steroid user 04/12/2019   Cervical disc disease with myelopathy 03/20/2019   Elevated blood-pressure reading, without diagnosis of hypertension 03/01/2019   Long-term use of high-risk medication 02/01/2019   At high risk for injury related to fall 12/13/2018   Lumbar radiculopathy, chronic 12/13/2018   Cervical myelopathy ([place]) 09/07/2018   Hyperlipidemia associated with type 2 diabetes mellitus ([place]) 06/22/2018   Long term current use of aspirin 06/22/2018   Gastroesophageal  reflux disease 06/22/2018   History of CVA (cerebrovascular accident) 17/49/4496   History of Helicobacter pylori infection 06/22/2018   Paresthesia 02/24/2018   Gait abnormality 02/24/2018   Weakness 02/24/2018   Tobacco use disorder 02/15/2018   Essential hypertension 02/15/2018   Type 2 diabetes, controlled, with peripheral neuropathy ([place]) 02/15/2018   Rheumatoid arthritis ([place]) 02/15/2018    Tony Ortega, PT 11/14/2020, 12:53 PM  [facility] [address] [place], [place], [address] Phone: [phone]   Fax:  [phone]  Name: Tony Ortega MRN: [id number] Date of Birth: 06/25/1960

## 2020-11-22 ENCOUNTER — Other Ambulatory Visit: Payer: Self-pay

## 2020-12-04 ENCOUNTER — Encounter: Payer: Self-pay | Admitting: Nurse Practitioner

## 2020-12-04 ENCOUNTER — Other Ambulatory Visit: Payer: Self-pay | Admitting: Nurse Practitioner

## 2020-12-04 ENCOUNTER — Other Ambulatory Visit: Payer: Self-pay

## 2020-12-04 ENCOUNTER — Telehealth (HOSPITAL_BASED_OUTPATIENT_CLINIC_OR_DEPARTMENT_OTHER): Payer: Medicaid Other | Admitting: Nurse Practitioner

## 2020-12-04 DIAGNOSIS — M069 Rheumatoid arthritis, unspecified: Secondary | ICD-10-CM

## 2020-12-04 DIAGNOSIS — M2550 Pain in unspecified joint: Secondary | ICD-10-CM

## 2020-12-04 DIAGNOSIS — M0579 Rheumatoid arthritis with rheumatoid factor of multiple sites without organ or systems involvement: Secondary | ICD-10-CM | POA: Diagnosis not present

## 2020-12-04 MED ORDER — PREDNISONE 5 MG PO TABS
5.0000 mg | ORAL_TABLET | Freq: Every day | ORAL | 0 refills | Status: DC
  Filled 2020-12-04: qty 14, 14d supply, fill #0

## 2020-12-04 MED ORDER — METHOCARBAMOL 500 MG PO TABS
500.0000 mg | ORAL_TABLET | Freq: Three times a day (TID) | ORAL | 3 refills | Status: AC | PRN
  Filled 2020-12-04 – 2021-02-17 (×2): qty 60, 20d supply, fill #0

## 2020-12-04 NOTE — Progress Notes (Signed)
Virtual Visit via Telephone Note Due to national recommendations of social distancing due to [place] 19, telehealth visit is felt to be most appropriate for this patient at this time.  I discussed the limitations, risks, security and privacy concerns of performing an evaluation and management service by telephone and the availability of in person appointments. I also discussed with the patient that there may be a patient responsible charge related to this service. The patient expressed understanding and agreed to proceed.    I connected with Tony Ortega on 12/04/20  at   9:30 AM EDT  EDT by telephone and verified that I am speaking with the correct person using two identifiers.  Location of Patient: Private Residence   Location of Provider: [place] and [employer] Office    Persons participating in Telemedicine visit: Geryl Rankins FNP-BC Tony Ortega    History of Present Illness: Telemedicine visit for: Generalized joint pain He has a past medical history of DM2,  Hypertension, Neuropathy, Rheumatoid arthritis and Stroke(09/2018).   He is currently using a walker due to significant generalized joint pain.  Unfortunately he missed his appointment with rheumatology on October 5 and today states he was never notified from the rheumatologist office of the actual appointment date.  Daily prednisone does provide significant relief of his symptoms however he is aware that this is not a medication that I would be prescribing for him on a long-term basis.  He has not been taking his Lyrica as he states he was unsure what this actually medication had been prescribed for her.  I have instructed him that Lyrica should be used for pain control he states he will start using this.  Past Medical History:  Diagnosis Date   Diabetes mellitus without complication ([place])    Dyspnea    Hypertension    Neuropathy    Rheumatoid arthritis ([place])    Stroke ([place]) 09/2018   patient thanks he  had a stroke in Jan.2022 but no test was done    Past Surgical History:  Procedure Laterality Date   ANTERIOR CERVICAL DECOMP/DISCECTOMY FUSION N/A 03/20/2019   Procedure: Cervical four-five Cervical six-seven  Anterior cervical decompression/discectomy/fusion;  Surgeon: Kristeen Miss, MD;  Location: [facility];  Service: Neurosurgery;  Laterality: N/A;   KNEE SURGERY Right     Family History  Problem Relation Age of Onset   Diabetes Mother    Hypertension Mother    Rheum arthritis Mother    Other Father        unsure - thinks he may have history of cancer   CAD Neg Hx    Colon cancer Neg Hx    Colon polyps Neg Hx    Esophageal cancer Neg Hx    Rectal cancer Neg Hx    Stomach cancer Neg Hx     Social History   Socioeconomic History   Marital status: Married    Spouse name: Not on file   Number of children: 2   Years of education: 12   Highest education level: Not on file  Occupational History   Occupation: Disabled  Tobacco Use   Smoking status: Every Day    Packs/day: 0.10    Years: 12.00    Pack years: 1.20    Types: Cigarettes   Smokeless tobacco: Never  Vaping Use   Vaping Use: Never used  Substance and Sexual Activity   Alcohol use: Yes    Comment: once a month drink per pt   Drug use: Never  Sexual activity: Not Currently  Other Topics Concern   Not on file  Social History Narrative   Lives at home with mother.   Left-handed.   He released was released from prison in March 2019.  He served 32 years.   2 cups caffeine per day.      Social Determinants of Health   Financial Resource Strain: Not on file  Food Insecurity: Not on file  Transportation Needs: Not on file  Physical Activity: Not on file  Stress: Not on file  Social Connections: Not on file     Observations/Objective: Awake, alert and oriented x 3   Review of Systems  Constitutional:  Negative for fever, malaise/fatigue and weight loss.  HENT: Negative.  Negative for nosebleeds.   Eyes:  Negative.  Negative for blurred vision, double vision and photophobia.  Respiratory: Negative.  Negative for cough and shortness of breath.   Cardiovascular: Negative.  Negative for chest pain, palpitations and leg swelling.  Gastrointestinal: Negative.  Negative for heartburn, nausea and vomiting.  Musculoskeletal:  Positive for joint pain and myalgias.  Neurological: Negative.  Negative for dizziness, focal weakness, seizures and headaches.  Psychiatric/Behavioral: Negative.  Negative for suicidal ideas.    Assessment and Plan: Diagnoses and all orders for this visit:  Rheumatoid arthritis involving multiple sites with positive rheumatoid factor ([place]) Refilled prednisone for 2 weeks and methocarbamol at this time  Follow Up Instructions Return in about 4 weeks (around 01/01/2021).     I discussed the assessment and treatment plan with the patient. The patient was provided an opportunity to ask questions and all were answered. The patient agreed with the plan and demonstrated an understanding of the instructions.   The patient was advised to call back or seek an in-person evaluation if the symptoms worsen or if the condition fails to improve as anticipated.  I provided 10 minutes of non-face-to-face time during this encounter including median intraservice time, reviewing previous notes, labs, imaging, medications and explaining diagnosis and management.  Gildardo Pounds, FNP-BC

## 2020-12-27 ENCOUNTER — Telehealth: Payer: Self-pay

## 2020-12-27 NOTE — Telephone Encounter (Signed)
Dismissed from Rheumatology for no shows

## 2021-01-01 ENCOUNTER — Other Ambulatory Visit: Payer: Self-pay

## 2021-01-01 ENCOUNTER — Encounter: Payer: Self-pay | Admitting: Nurse Practitioner

## 2021-01-01 ENCOUNTER — Ambulatory Visit: Payer: Medicaid Other | Attending: Nurse Practitioner | Admitting: Nurse Practitioner

## 2021-01-01 VITALS — BP 117/80 | HR 61 | Ht 66.0 in | Wt 129.5 lb

## 2021-01-01 DIAGNOSIS — L209 Atopic dermatitis, unspecified: Secondary | ICD-10-CM

## 2021-01-01 DIAGNOSIS — M069 Rheumatoid arthritis, unspecified: Secondary | ICD-10-CM | POA: Diagnosis not present

## 2021-01-01 MED ORDER — PREDNISONE 5 MG PO TABS
5.0000 mg | ORAL_TABLET | Freq: Every day | ORAL | 0 refills | Status: DC
  Filled 2021-01-01: qty 30, 30d supply, fill #0

## 2021-01-01 MED ORDER — TRIAMCINOLONE ACETONIDE 0.025 % EX OINT
1.0000 "application " | TOPICAL_OINTMENT | Freq: Two times a day (BID) | CUTANEOUS | 1 refills | Status: DC
  Filled 2021-01-01 – 2021-02-17 (×2): qty 60, 30d supply, fill #0

## 2021-01-01 NOTE — Progress Notes (Signed)
Assessment & Plan:  Tony Ortega was seen today for medication refill.  Diagnoses and all orders for this visit:  Rheumatoid arthritis involving multiple sites, unspecified whether rheumatoid factor present ([place]) -     Ambulatory referral to Rheumatology -     predniSONE (DELTASONE) 5 MG tablet; Take 1 tablet (5 mg total) by mouth daily with breakfast. NO REFILLS  Atopic dermatitis, unspecified type -     triamcinolone (KENALOG) 0.025 % ointment; Apply 1 application topically 2 (two) times daily.   Patient has been counseled on age-appropriate routine health concerns for screening and prevention. These are reviewed and up-to-date. Referrals have been placed accordingly. Immunizations are up-to-date or declined.    Subjective:   Chief Complaint  Patient presents with   Medication Refill   HPI Tony Ortega 60 y.o. male presents to office today refill of prednisone for RA and PO triamcinolone for eczema He missed his Rheumatology appointment but states he never got a call from them.  He has a past medical history of DM2,  Hypertension, Neuropathy, Rheumatoid arthritis and Stroke(09/2018).   He has significant generalized joint pain.  Unfortunately he missed his appointment with rheumatology on October 5 and today states he was never notified from the rheumatologist office of the actual appointment date.  Daily prednisone does provide significant relief of his symptoms however he is aware that this is not a medication that I would be prescribing for him on a long-term basis.  He has not been taking his Lyrica as he states he was unsure what this actually medication had been prescribed for her.  I have instructed him that Lyrica should be used for pain control he states he will start using this Lab Results  Component Value Date   RF 121.5 (H) 10/07/2020    Eczema: Patient complains of rash on bilateral lower legs. Onset of symptoms several months ago, and have been unchanged since that  time.  Risk factors include rheumatoid arthritis. Treatment modalities that have been used in the past include: NONE   Review of Systems  Constitutional:  Negative for fever, malaise/fatigue and weight loss.  HENT: Negative.  Negative for nosebleeds.   Eyes: Negative.  Negative for blurred vision, double vision and photophobia.  Respiratory: Negative.  Negative for cough and shortness of breath.   Cardiovascular: Negative.  Negative for chest pain, palpitations and leg swelling.  Gastrointestinal: Negative.  Negative for heartburn, nausea and vomiting.  Musculoskeletal:  Positive for joint pain. Negative for myalgias.  Skin:  Positive for itching and rash.  Neurological: Negative.  Negative for dizziness, focal weakness, seizures and headaches.  Psychiatric/Behavioral: Negative.  Negative for suicidal ideas.    Past Medical History:  Diagnosis Date   Diabetes mellitus without complication ([place])    Dyspnea    Hypertension    Neuropathy    Rheumatoid arthritis ([place])    Stroke ([place]) 09/2018   patient thanks he had a stroke in Jan.2022 but no test was done    Past Surgical History:  Procedure Laterality Date   ANTERIOR CERVICAL DECOMP/DISCECTOMY FUSION N/A 03/20/2019   Procedure: Cervical four-five Cervical six-seven  Anterior cervical decompression/discectomy/fusion;  Surgeon: Kristeen Miss, MD;  Location: [facility];  Service: Neurosurgery;  Laterality: N/A;   KNEE SURGERY Right     Family History  Problem Relation Age of Onset   Diabetes Mother    Hypertension Mother    Rheum arthritis Mother    Other Father  unsure - thinks he may have history of cancer   CAD Neg Hx    Colon cancer Neg Hx    Colon polyps Neg Hx    Esophageal cancer Neg Hx    Rectal cancer Neg Hx    Stomach cancer Neg Hx     Social History Reviewed with no changes to be made today.   Outpatient Medications Prior to Visit  Medication Sig Dispense Refill   ACCU-CHEK FASTCLIX LANCETS MISC Use as directed  twice daily to test blood sugar. 102 each 12   amLODipine (NORVASC) 10 MG tablet TAKE 1 TABLET (10 MG TOTAL) BY MOUTH DAILY. TO LOWER BLOOD PRESSURE 90 tablet 0   aspirin 325 MG tablet TAKE 1 TABLET (325 MG TOTAL) BY MOUTH DAILY. EAT BEFORE TAKING MEDICATION 90 tablet 3   atorvastatin (LIPITOR) 20 MG tablet TAKE 1 TABLET (20 MG TOTAL) BY MOUTH DAILY. 90 tablet 1   bisacodyl (DULCOLAX) 5 MG EC tablet TAKE 1 TABLET (5 MG TOTAL) BY MOUTH ONCE FOR 1 DOSE. 4 tablet 0   Blood Glucose Monitoring Suppl (ACCU-CHEK GUIDE) w/Device KIT 1 each by Does not apply route 2 (two) times daily. Use as directed twice daily to test blood sugar. E11.42 1 kit 0   Elastic Bandages & Supports (KNEE BRACE) MISC Please provide patient with insurance approved left knee brace. R29.6; M06.9 1 each 0   glucose blood test strip USE AS INSTRUCTED TO CHECK BLOOD SUGARS UP TO 4 TIMES PER DAY 100 strip 12   hydrochlorothiazide (HYDRODIURIL) 25 MG tablet TAKE 1 TABLET (25 MG TOTAL) BY MOUTH DAILY. 30 tablet 2   methocarbamol (ROBAXIN) 500 MG tablet Take 1 tablet (500 mg total) by mouth every 8 (eight) hours as needed for muscle spasms. 60 tablet 3   omeprazole (PRILOSEC) 20 MG capsule TAKE 1 CAPSULE (20 MG TOTAL) BY MOUTH DAILY. TO REDUCE STOMACH ACID 90 capsule 4   polyethylene glycol-electrolytes (NULYTELY) 420 g solution TAKE 4,000 MLS BY MOUTH ONCE FOR 1 DOSE. 4000 mL 0   polyethylene glycol-electrolytes (NULYTELY) 420 g solution TAKE 4,000 MLS BY MOUTH ONCE FOR 1 DOSE. 4000 mL 0   sildenafil (VIAGRA) 100 MG tablet Take 1 tablet (100 mg total) by mouth daily as needed for erectile dysfunction. 10 tablet 11   tadalafil, PAH, (ADCIRCA) 20 MG tablet TAKE 0.5-1 TABLETS (10-20 MG TOTAL) BY MOUTH DAILY AS NEEDED FOR UP TO 10 DAYS FOR ERECTILE DYSFUNCTION. 10 tablet 12   predniSONE (DELTASONE) 5 MG tablet Take 1 tablet (5 mg total) by mouth daily with breakfast. 14 tablet 0   metFORMIN (GLUCOPHAGE) 500 MG tablet TAKE 1 TABLET (500 MG  TOTAL) BY MOUTH DAILY WITH BREAKFAST. (Patient not taking: Reported on 01/01/2021) 90 tablet 1   pregabalin (LYRICA) 75 MG capsule Take 1 capsule (75 mg total) by mouth 2 (two) times daily. 60 capsule 3   No facility-administered medications prior to visit.    Allergies  Allergen Reactions   Pork-Derived Products Other (See Comments)    No pork products       Objective:    BP 117/80   Pulse 61   Ht '5\' 6"'  (1.676 m)   Wt 129 lb 8 oz (58.7 kg)   SpO2 99%   BMI 20.90 kg/m  Wt Readings from Last 3 Encounters:  01/01/21 129 lb 8 oz (58.7 kg)  10/07/20 125 lb (56.7 kg)  04/17/20 133 lb (60.3 kg)    Physical Exam Vitals and nursing note reviewed.  Constitutional:      Appearance: He is well-developed.  HENT:     Head: Normocephalic and atraumatic.  Cardiovascular:     Rate and Rhythm: Normal rate and regular rhythm.     Heart sounds: Normal heart sounds. No murmur heard.   No friction rub. No gallop.  Pulmonary:     Effort: Pulmonary effort is normal. No tachypnea or respiratory distress.     Breath sounds: Normal breath sounds. No decreased breath sounds, wheezing, rhonchi or rales.  Chest:     Chest wall: No tenderness.  Abdominal:     General: Bowel sounds are normal.     Palpations: Abdomen is soft.  Musculoskeletal:        General: Normal range of motion.     Cervical back: Normal range of motion.  Skin:    General: Skin is warm and dry.     Findings: Rash (macular dry scaly rash on lower right leg) present.  Neurological:     Mental Status: He is alert and oriented to person, place, and time.     Coordination: Coordination normal.  Psychiatric:        Behavior: Behavior normal. Behavior is cooperative.        Thought Content: Thought content normal.        Judgment: Judgment normal.         Patient has been counseled extensively about nutrition and exercise as well as the importance of adherence with medications and regular follow-up. The patient was  given clear instructions to go to ER or return to medical center if symptoms don't improve, worsen or new problems develop. The patient verbalized understanding.   Follow-up: Return in about 3 months (around 04/03/2021) for prediabetes.   Gildardo Pounds, FNP-BC [facility] [place], [place]   01/01/2021, 1:14 PM

## 2021-01-06 ENCOUNTER — Other Ambulatory Visit: Payer: Self-pay

## 2021-01-07 ENCOUNTER — Ambulatory Visit: Payer: Medicaid Other | Attending: Nurse Practitioner | Admitting: Nurse Practitioner

## 2021-01-07 ENCOUNTER — Other Ambulatory Visit: Payer: Self-pay | Admitting: Nurse Practitioner

## 2021-01-07 ENCOUNTER — Telehealth: Payer: Self-pay | Admitting: Nurse Practitioner

## 2021-01-07 DIAGNOSIS — Z202 Contact with and (suspected) exposure to infections with a predominantly sexual mode of transmission: Secondary | ICD-10-CM | POA: Diagnosis not present

## 2021-01-07 DIAGNOSIS — Z7251 High risk heterosexual behavior: Secondary | ICD-10-CM

## 2021-01-07 DIAGNOSIS — R768 Other specified abnormal immunological findings in serum: Secondary | ICD-10-CM

## 2021-01-07 DIAGNOSIS — Z114 Encounter for screening for human immunodeficiency virus [HIV]: Secondary | ICD-10-CM

## 2021-01-07 NOTE — Telephone Encounter (Signed)
No answer. LVM ?

## 2021-01-07 NOTE — Progress Notes (Signed)
Virtual Visit via Telephone Note Due to national recommendations of social distancing due to [place] 19, telehealth visit is felt to be most appropriate for this patient at this time.  I discussed the limitations, risks, security and privacy concerns of performing an evaluation and management service by telephone and the availability of in person appointments. I also discussed with the patient that there may be a patient responsible charge related to this service. The patient expressed understanding and agreed to proceed.    I connected with Tony Ortega on 01/08/21  at   1:30 PM EST  EDT by telephone and verified that I am speaking with the correct person using two identifiers.  Location of Patient: Private Residence   Location of Provider: [place] and [place] participating in Telemedicine visit: Geryl Rankins FNP-BC Tony Ortega    History of Present Illness: Telemedicine visit for: STD TESTING  Sexually Transmitted Disease Check: Patient presents for sexually transmitted disease check. Sexual history reviewed with the patient. STD exposure: sexual contact with individual with uncertain background several days ago.  Previous history of STD: Syphilis. Current symptoms include none.  Contraception: none.     Past Medical History:  Diagnosis Date   Diabetes mellitus without complication ([place])    Dyspnea    Hypertension    Neuropathy    Rheumatoid arthritis ([place])    Stroke ([place]) 09/2018   patient thanks he had a stroke in Jan.2022 but no test was done    Past Surgical History:  Procedure Laterality Date   ANTERIOR CERVICAL DECOMP/DISCECTOMY FUSION N/A 03/20/2019   Procedure: Cervical four-five Cervical six-seven  Anterior cervical decompression/discectomy/fusion;  Surgeon: Kristeen Miss, MD;  Location: [facility];  Service: Neurosurgery;  Laterality: N/A;   KNEE SURGERY Right     Family History  Problem Relation Age of Onset   Diabetes Mother     Hypertension Mother    Rheum arthritis Mother    Other Father        unsure - thinks he may have history of cancer   CAD Neg Hx    Colon cancer Neg Hx    Colon polyps Neg Hx    Esophageal cancer Neg Hx    Rectal cancer Neg Hx    Stomach cancer Neg Hx     Social History   Socioeconomic History   Marital status: Married    Spouse name: Not on file   Number of children: 2   Years of education: 12   Highest education level: Not on file  Occupational History   Occupation: Disabled  Tobacco Use   Smoking status: Every Day    Packs/day: 0.10    Years: 12.00    Pack years: 1.20    Types: Cigarettes   Smokeless tobacco: Never  Vaping Use   Vaping Use: Never used  Substance and Sexual Activity   Alcohol use: Yes    Comment: once a month drink per pt   Drug use: Never   Sexual activity: Not Currently  Other Topics Concern   Not on file  Social History Narrative   Lives at home with mother.   Left-handed.   He released was released from prison in March 2019.  He served 32 years.   2 cups caffeine per day.      Social Determinants of Health   Financial Resource Strain: Not on file  Food Insecurity: Not on file  Transportation Needs: Not on file  Physical Activity: Not  on file  Stress: Not on file  Social Connections: Not on file     Observations/Objective: Awake, alert and oriented x 3   ROS  Assessment and Plan: Diagnoses and all orders for this visit:  Potential exposure to STD Urine cytology HIV Hep C-he had a positive hep C l several months ago but was lost to follow-up [facility]  Follow Up Instructions Return if symptoms worsen or fail to improve.     I discussed the assessment and treatment plan with the patient. The patient was provided an opportunity to ask questions and all were answered. The patient agreed with the plan and demonstrated an understanding of the instructions.   The patient was advised to call back or seek an in-person evaluation if  the symptoms worsen or if the condition fails to improve as anticipated.  I provided 10 minutes of non-face-to-face time during this encounter including median intraservice time, reviewing previous notes, labs, imaging, medications and explaining diagnosis and management.  Gildardo Pounds, FNP-BC

## 2021-01-08 ENCOUNTER — Other Ambulatory Visit (HOSPITAL_COMMUNITY)
Admission: RE | Admit: 2021-01-08 | Discharge: 2021-01-08 | Disposition: A | Discharge status: Home / Self Care | Payer: Medicaid Other | Source: Ambulatory Visit | Attending: Nurse Practitioner | Admitting: Nurse Practitioner

## 2021-01-08 ENCOUNTER — Ambulatory Visit: Payer: Medicaid Other | Attending: Nurse Practitioner

## 2021-01-08 ENCOUNTER — Encounter: Payer: Self-pay | Admitting: Nurse Practitioner

## 2021-01-08 ENCOUNTER — Other Ambulatory Visit: Payer: Self-pay

## 2021-01-08 DIAGNOSIS — Z7251 High risk heterosexual behavior: Secondary | ICD-10-CM | POA: Insufficient documentation

## 2021-01-08 DIAGNOSIS — R768 Other specified abnormal immunological findings in serum: Secondary | ICD-10-CM

## 2021-01-08 DIAGNOSIS — Z114 Encounter for screening for human immunodeficiency virus [HIV]: Secondary | ICD-10-CM

## 2021-01-09 ENCOUNTER — Other Ambulatory Visit: Payer: Self-pay | Admitting: Nurse Practitioner

## 2021-01-09 DIAGNOSIS — A530 Latent syphilis, unspecified as early or late: Secondary | ICD-10-CM

## 2021-01-09 LAB — URINE CYTOLOGY ANCILLARY ONLY
Bacterial Vaginitis-Urine: NEGATIVE
Candida Urine: NEGATIVE
Chlamydia: NEGATIVE
Comment: NEGATIVE
Comment: NEGATIVE
Comment: NORMAL
Neisseria Gonorrhea: NEGATIVE
Trichomonas: NEGATIVE

## 2021-01-09 MED ORDER — PENICILLIN G BENZATHINE 2400000 UNIT/4ML IM SUSP: 2.4000 10*6.[IU] | Freq: Once | INTRAMUSCULAR | Status: DC

## 2021-01-09 NOTE — Progress Notes (Signed)
For others, a single dose of penicillin G benzathine (2.4 million units intramuscularly [IM]) is standard therap

## 2021-01-10 ENCOUNTER — Telehealth: Payer: Self-pay

## 2021-01-10 ENCOUNTER — Ambulatory Visit: Payer: Medicaid Other | Attending: Nurse Practitioner

## 2021-01-10 ENCOUNTER — Other Ambulatory Visit: Payer: Self-pay

## 2021-01-10 ENCOUNTER — Other Ambulatory Visit: Payer: Self-pay | Admitting: Nurse Practitioner

## 2021-01-10 DIAGNOSIS — A530 Latent syphilis, unspecified as early or late: Secondary | ICD-10-CM

## 2021-01-10 DIAGNOSIS — B1920 Unspecified viral hepatitis C without hepatic coma: Secondary | ICD-10-CM

## 2021-01-10 LAB — HCV RNA QUANT
HCV log10: 6.72 log10 IU/mL
Hepatitis C Quantitation: 5250000 IU/mL

## 2021-01-10 LAB — RPR, QUANT+TP ABS (REFLEX)
Rapid Plasma Reagin, Quant: 1:2 {titer} — ABNORMAL HIGH
T Pallidum Abs: REACTIVE — AB

## 2021-01-10 LAB — HIV ANTIBODY (ROUTINE TESTING W REFLEX): HIV Screen 4th Generation wRfx: NONREACTIVE

## 2021-01-10 LAB — SYPHILIS: RPR W/REFLEX TO RPR TITER AND TREPONEMAL ANTIBODIES, TRADITIONAL SCREENING AND DIAGNOSIS ALGORITHM: RPR Ser Ql: REACTIVE — AB

## 2021-01-10 NOTE — Telephone Encounter (Signed)
Called patient reviewed all information and repeated back to me. Will call if any questions.  Made pt appt for nurse visit today.

## 2021-01-10 NOTE — Telephone Encounter (Signed)
Called patient reviewed all information and repeated back to me. Will call if any questions.  ? ?

## 2021-01-10 NOTE — Telephone Encounter (Signed)
-----   Message from Tony Pounds, NP sent at 01/09/2021  7:55 PM EST ----- HIV NEGATIVE. Positive for syphilis. Claiborne Billings please fill out [facility] Dept form for syphilis and fax to them for tracking purposes He will need to make a nurse visit for penicillin [facility] will also need to be tested by their PCP or health department if he has been sexually active.  I have also sent an eye doctor referral for eye exam.

## 2021-01-10 NOTE — Telephone Encounter (Signed)
-----   Message from Gildardo Pounds, NP sent at 01/10/2021  9:27 AM EST ----- Positive for HEP C  Referred to ID

## 2021-01-13 ENCOUNTER — Encounter: Payer: Self-pay | Admitting: Infectious Disease

## 2021-01-13 ENCOUNTER — Ambulatory Visit (INDEPENDENT_AMBULATORY_CARE_PROVIDER_SITE_OTHER): Payer: Medicaid Other | Admitting: Infectious Disease

## 2021-01-13 ENCOUNTER — Other Ambulatory Visit (HOSPITAL_COMMUNITY): Payer: Self-pay

## 2021-01-13 ENCOUNTER — Other Ambulatory Visit: Payer: Self-pay

## 2021-01-13 VITALS — BP 116/75 | HR 69 | Temp 98.2°F | Wt 130.4 lb

## 2021-01-13 DIAGNOSIS — B182 Chronic viral hepatitis C: Secondary | ICD-10-CM

## 2021-01-13 DIAGNOSIS — A539 Syphilis, unspecified: Secondary | ICD-10-CM | POA: Diagnosis not present

## 2021-01-13 DIAGNOSIS — R5381 Other malaise: Secondary | ICD-10-CM

## 2021-01-13 DIAGNOSIS — G959 Disease of spinal cord, unspecified: Secondary | ICD-10-CM

## 2021-01-13 DIAGNOSIS — E1142 Type 2 diabetes mellitus with diabetic polyneuropathy: Secondary | ICD-10-CM

## 2021-01-13 DIAGNOSIS — F1290 Cannabis use, unspecified, uncomplicated: Secondary | ICD-10-CM

## 2021-01-13 HISTORY — DX: Chronic viral hepatitis C: B18.2

## 2021-01-13 HISTORY — DX: Syphilis, unspecified: A53.9

## 2021-01-13 NOTE — Progress Notes (Signed)
Subjective:  Reason for infectious disease consultation: Chronic hepatitis C without hepatic coma  Requesting Physician: Geryl Rankins, NP   Patient ID: Tony Ortega, male    DOB: [birth year], 60 y.o.   MRN: [id number]  HPI  Tony Ortega is a 60 year old black man living with diabetes mellitus with gastroesophageal reflux disease who has been diagnosed with chronic hepatitis C without hepatic coma.  He also was diagnosed with syphilis with a low titer of 1-2 and received 1 intramuscular shot of penicillin.  He was fairly irritable when he came to clinic he also smelled of marijuana he said he felt poorly and was in a bad mood and needed to rest.  He was fairly evasive about most of the questions I tried to ask him about sexual transmitted infections risk for these and in particular the idea of HIV prevention with PrEP.  He also did not want any blood drawn today though we needed to initiate hepatitis C treatment.  He agreed to come back tomorrow for lab testing.  Past Medical History:  Diagnosis Date   Chronic hepatitis C without hepatic coma ([place]) 01/13/2021   Diabetes mellitus without complication ([place])    Dyspnea    Hypertension    Neuropathy    Rheumatoid arthritis ([place])    Stroke ([place]) 09/2018   patient thanks he had a stroke in Jan.2022 but no test was done   Syphilis 01/13/2021    Past Surgical History:  Procedure Laterality Date   ANTERIOR CERVICAL DECOMP/DISCECTOMY FUSION N/A 03/20/2019   Procedure: Cervical four-five Cervical six-seven  Anterior cervical decompression/discectomy/fusion;  Surgeon: Kristeen Miss, MD;  Location: [place];  Service: Neurosurgery;  Laterality: N/A;   KNEE SURGERY Right     Family History  Problem Relation Age of Onset   Diabetes Mother    Hypertension Mother    Rheum arthritis Mother    Other Father        unsure - thinks he may have history of cancer   CAD Neg Hx    Colon cancer Neg Hx    Colon polyps Neg Hx    Esophageal cancer Neg Hx     Rectal cancer Neg Hx    Stomach cancer Neg Hx       Social History   Socioeconomic History   Marital status: Married    Spouse name: Not on file   Number of children: 2   Years of education: 12   Highest education level: Not on file  Occupational History   Occupation: Disabled  Tobacco Use   Smoking status: Every Day    Packs/day: 0.10    Years: 12.00    Pack years: 1.20    Types: Cigarettes   Smokeless tobacco: Never  Vaping Use   Vaping Use: Never used  Substance and Sexual Activity   Alcohol use: Yes    Comment: once a month drink per pt   Drug use: Never   Sexual activity: Not Currently  Other Topics Concern   Not on file  Social History Narrative   Lives at home with mother.   Left-handed.   He released was released from prison in March 2019.  He served 32 years.   2 cups caffeine per day.      Social Determinants of Health   Financial Resource Strain: Not on file  Food Insecurity: Not on file  Transportation Needs: Not on file  Physical Activity: Not on file  Stress: Not on file  Social Connections: Not on  file    Allergies  Allergen Reactions   Pork-Derived Products Other (See Comments)    No pork products     Current Outpatient Medications:    ACCU-CHEK FASTCLIX LANCETS MISC, Use as directed twice daily to test blood sugar., Disp: 102 each, Rfl: 12   amLODipine (NORVASC) 10 MG tablet, TAKE 1 TABLET (10 MG TOTAL) BY MOUTH DAILY. TO LOWER BLOOD PRESSURE, Disp: 90 tablet, Rfl: 0   aspirin 325 MG tablet, TAKE 1 TABLET (325 MG TOTAL) BY MOUTH DAILY. EAT BEFORE TAKING MEDICATION, Disp: 90 tablet, Rfl: 3   atorvastatin (LIPITOR) 20 MG tablet, TAKE 1 TABLET (20 MG TOTAL) BY MOUTH DAILY., Disp: 90 tablet, Rfl: 1   bisacodyl (DULCOLAX) 5 MG EC tablet, TAKE 1 TABLET (5 MG TOTAL) BY MOUTH ONCE FOR 1 DOSE., Disp: 4 tablet, Rfl: 0   Blood Glucose Monitoring Suppl (ACCU-CHEK GUIDE) w/Device KIT, 1 each by Does not apply route 2 (two) times daily. Use as  directed twice daily to test blood sugar. E11.42, Disp: 1 kit, Rfl: 0   Elastic Bandages & Supports (KNEE BRACE) MISC, Please provide patient with insurance approved left knee brace. R29.6; M06.9, Disp: 1 each, Rfl: 0   glucose blood test strip, USE AS INSTRUCTED TO CHECK BLOOD SUGARS UP TO 4 TIMES PER DAY, Disp: 100 strip, Rfl: 12   hydrochlorothiazide (HYDRODIURIL) 25 MG tablet, TAKE 1 TABLET (25 MG TOTAL) BY MOUTH DAILY., Disp: 30 tablet, Rfl: 2   omeprazole (PRILOSEC) 20 MG capsule, TAKE 1 CAPSULE (20 MG TOTAL) BY MOUTH DAILY. TO REDUCE STOMACH ACID, Disp: 90 capsule, Rfl: 4   sildenafil (VIAGRA) 100 MG tablet, Take 1 tablet (100 mg total) by mouth daily as needed for erectile dysfunction., Disp: 10 tablet, Rfl: 11   metFORMIN (GLUCOPHAGE) 500 MG tablet, TAKE 1 TABLET (500 MG TOTAL) BY MOUTH DAILY WITH BREAKFAST. (Patient not taking: Reported on 01/01/2021), Disp: 90 tablet, Rfl: 1   polyethylene glycol-electrolytes (NULYTELY) 420 g solution, TAKE 4,000 MLS BY MOUTH ONCE FOR 1 DOSE., Disp: 4000 mL, Rfl: 0   polyethylene glycol-electrolytes (NULYTELY) 420 g solution, TAKE 4,000 MLS BY MOUTH ONCE FOR 1 DOSE., Disp: 4000 mL, Rfl: 0   predniSONE (DELTASONE) 5 MG tablet, Take 1 tablet (5 mg total) by mouth daily with breakfast. NO REFILLS, Disp: 30 tablet, Rfl: 0   pregabalin (LYRICA) 75 MG capsule, Take 1 capsule (75 mg total) by mouth 2 (two) times daily., Disp: 60 capsule, Rfl: 3   tadalafil, PAH, (ADCIRCA) 20 MG tablet, TAKE 0.5-1 TABLETS (10-20 MG TOTAL) BY MOUTH DAILY AS NEEDED FOR UP TO 10 DAYS FOR ERECTILE DYSFUNCTION., Disp: 10 tablet, Rfl: 12   triamcinolone (KENALOG) 0.025 % ointment, Apply 1 application topically 2 (two) times daily., Disp: 60 g, Rfl: 1  Current Facility-Administered Medications:    [START ON 01/24/2021] Penicillin G Benzathine SUSP 2,400,000 Units, 2.4 Million Units, Intramuscular, Once, Gildardo Pounds, NP   Review of Systems  Constitutional:  Positive for fatigue.  Negative for activity change, appetite change, chills, diaphoresis, fever and unexpected weight change.  HENT:  Negative for congestion, rhinorrhea, sinus pressure, sneezing, sore throat and trouble swallowing.   Eyes:  Negative for photophobia and visual disturbance.  Respiratory:  Negative for cough, chest tightness, shortness of breath, wheezing and stridor.   Cardiovascular:  Negative for chest pain, palpitations and leg swelling.  Gastrointestinal:  Negative for abdominal distention, abdominal pain, anal bleeding, blood in stool, constipation, diarrhea, nausea and vomiting.  Genitourinary:  Negative  for difficulty urinating, dysuria, flank pain and hematuria.  Musculoskeletal:  Negative for arthralgias, back pain, gait problem, joint swelling and myalgias.  Skin:  Negative for color change, pallor, rash and wound.  Neurological:  Negative for dizziness, tremors, weakness, light-headedness and headaches.  Hematological:  Negative for adenopathy. Does not bruise/bleed easily.  Psychiatric/Behavioral:  Negative for agitation, behavioral problems, confusion, decreased concentration, dysphoric mood, sleep disturbance and suicidal ideas.       Objective:   Physical Exam Constitutional:      Appearance: He is well-developed.  HENT:     Head: Normocephalic and atraumatic.  Eyes:     Conjunctiva/sclera: Conjunctivae normal.  Cardiovascular:     Rate and Rhythm: Normal rate and regular rhythm.  Pulmonary:     Effort: Pulmonary effort is normal. No respiratory distress.     Breath sounds: No wheezing.  Abdominal:     General: There is no distension.     Palpations: Abdomen is soft.  Musculoskeletal:        General: No tenderness. Normal range of motion.     Cervical back: Normal range of motion and neck supple.  Skin:    General: Skin is warm and dry.     Coloration: Skin is not pale.     Findings: No erythema or rash.  Neurological:     General: No focal deficit present.     Mental  Status: He is alert and oriented to person, place, and time.  Psychiatric:        Attention and Perception: Attention normal.        Mood and Affect: Mood is anxious. Affect is labile.        Speech: Speech normal.        Behavior: Behavior is agitated. Behavior is cooperative.        Cognition and Memory: Cognition normal.        Judgment: Judgment normal.          Assessment & Plan:   Chronic hepatitis C without hepatic coma:  I am ordering a hepatitis C genotype a HCV FibroSure, hepatitis A and B serologies, and ultrasound with elastography.  Once we have baseline information that is required by Medicaid we will then initiate therapy probably with [facility]   Syphilis: His titer is fairly low at 1-2 more consistent with someone who previously had syphilis.  He has received 2,400,000 units of benzathine penicillin.  Technically anyone who has a positive syphilis test without knowledge of having had syphilis before should receive 2,400,000 units weekly x3 weeks.  I do not think this patient is going to let anyone do this though.  He did test negative for gonorrhea and chlamydia via urine.  At risk for HIV: He does not have insight into the fact that he is at risk through heterosexual transmission in particular if any of his sexual partners have HCV or syphilis. He claims to have only been active with one woman since he got of of prison (incarcerated x 30 years)  Tattoos: COULD have been source of his HCV but not syphilis.  Marland Kitchen .I spent 82 minutes with the patient including than 50% of the time in face to face counseling of the patient guarding the medications we used to treat hepatitis C the labs that we need, the idea of preexposure prophylaxis against HIV the interpretation of his positive RPR, reviewing all of his recent labs with primary care including his negative HIV test his hepatitis C viral loads his positive  RPR is negative gonorrhea chlamydia test, along with review of medical  records in preparation for the visit and during the visit and in coordination of his care.

## 2021-01-13 NOTE — Patient Instructions (Signed)
We will get labs tomorrow (please make appt)  We need to get an Ultrasound of your liver  Lets make an APPT with [facility] in one months time to start you on medications (we may do so before then if your labs that we need and [place] are done)

## 2021-01-14 ENCOUNTER — Other Ambulatory Visit: Payer: Medicaid Other

## 2021-01-14 NOTE — Addendum Note (Signed)
Addended by: Caffie Pinto on: 01/14/2021 09:16 AM   Modules accepted: Orders

## 2021-01-23 ENCOUNTER — Ambulatory Visit (HOSPITAL_COMMUNITY): Payer: Medicaid Other | Attending: Infectious Disease

## 2021-01-27 ENCOUNTER — Other Ambulatory Visit: Payer: Self-pay | Admitting: Pharmacist

## 2021-01-27 ENCOUNTER — Other Ambulatory Visit (HOSPITAL_COMMUNITY): Payer: Self-pay

## 2021-01-27 DIAGNOSIS — Z79899 Other long term (current) drug therapy: Secondary | ICD-10-CM

## 2021-01-27 MED ORDER — APRETUDE 600 MG/3ML IM SUER
600.0000 mg | INTRAMUSCULAR | 1 refills | Status: DC
  Filled 2021-01-27 (×2): qty 3, 30d supply, fill #0
  Filled 2021-02-24: qty 3, 30d supply, fill #1

## 2021-01-27 MED ORDER — APRETUDE 600 MG/3ML IM SUER
600.0000 mg | INTRAMUSCULAR | 5 refills | Status: DC
  Filled 2021-01-27: qty 3, 60d supply, fill #0
  Filled 2021-04-21: qty 3, 34d supply, fill #0

## 2021-01-28 ENCOUNTER — Other Ambulatory Visit (HOSPITAL_COMMUNITY): Payer: Self-pay

## 2021-01-29 ENCOUNTER — Telehealth: Payer: Self-pay

## 2021-01-29 NOTE — Telephone Encounter (Signed)
[facility] Patient Advocate Encounter  Patient's medication (Apretude) have been couriered to [facility] from [place] and will be administered on the patient next office visit on 02/15/20.  Ileene Patrick , [facility] Patient [facility] Phone: [phone] Fax:  [phone]

## 2021-02-05 ENCOUNTER — Other Ambulatory Visit: Payer: Self-pay

## 2021-02-05 ENCOUNTER — Other Ambulatory Visit: Payer: Self-pay | Admitting: Nurse Practitioner

## 2021-02-05 MED FILL — Tadalafil Tab 20 MG (PAH): ORAL | 30 days supply | Qty: 10 | Fill #0 | Status: CN

## 2021-02-06 ENCOUNTER — Other Ambulatory Visit: Payer: Self-pay

## 2021-02-11 ENCOUNTER — Other Ambulatory Visit (HOSPITAL_COMMUNITY): Payer: Self-pay

## 2021-02-12 ENCOUNTER — Other Ambulatory Visit: Payer: Self-pay | Admitting: Pharmacist

## 2021-02-12 DIAGNOSIS — B182 Chronic viral hepatitis C: Secondary | ICD-10-CM

## 2021-02-13 ENCOUNTER — Ambulatory Visit: Payer: Medicaid Other | Admitting: Pharmacist

## 2021-02-17 ENCOUNTER — Other Ambulatory Visit: Payer: Self-pay | Admitting: Nurse Practitioner

## 2021-02-17 ENCOUNTER — Other Ambulatory Visit: Payer: Self-pay | Admitting: Pharmacist

## 2021-02-17 ENCOUNTER — Other Ambulatory Visit: Payer: Self-pay

## 2021-02-17 DIAGNOSIS — I10 Essential (primary) hypertension: Secondary | ICD-10-CM

## 2021-02-17 MED ORDER — HYDROCHLOROTHIAZIDE 25 MG PO TABS
ORAL_TABLET | Freq: Every day | ORAL | 2 refills | Status: DC
  Filled 2021-02-17: qty 90, 90d supply, fill #0

## 2021-02-17 MED ORDER — SILDENAFIL CITRATE 100 MG PO TABS
ORAL_TABLET | ORAL | 2 refills | Status: DC
  Filled 2021-02-17: qty 10, 30d supply, fill #0

## 2021-02-17 MED ORDER — AMLODIPINE BESYLATE 10 MG PO TABS
ORAL_TABLET | Freq: Every day | ORAL | 0 refills | Status: DC
  Filled 2021-02-17: qty 90, 90d supply, fill #0

## 2021-02-17 MED FILL — Atorvastatin Calcium Tab 20 MG (Base Equivalent): ORAL | 90 days supply | Qty: 90 | Fill #0 | Status: AC

## 2021-02-17 MED FILL — Omeprazole Cap Delayed Release 20 MG: ORAL | 90 days supply | Qty: 90 | Fill #0 | Status: AC

## 2021-02-21 ENCOUNTER — Other Ambulatory Visit: Payer: Self-pay

## 2021-02-24 ENCOUNTER — Other Ambulatory Visit (HOSPITAL_COMMUNITY): Payer: Self-pay

## 2021-02-25 ENCOUNTER — Telehealth: Payer: Self-pay

## 2021-02-25 ENCOUNTER — Other Ambulatory Visit (HOSPITAL_COMMUNITY): Payer: Self-pay

## 2021-02-25 NOTE — Telephone Encounter (Signed)
Thank you for letting us know. He does seem to be a lower risk sexual candidate, so that is good news.

## 2021-02-25 NOTE — Telephone Encounter (Signed)
Patient called stating that he missed a call from someone from our office. I informed patient I didn't see where anyone has contacted him today. But it looks like apretude was delivered to our office and it was supposed to be administered to patient at his appointment on 02/15/2020. Patient asked what is Apretude - I informed him it is a medication taken to help prevent HIV. Patient stated "who needs that and that he wasn't messing with our office". He then ended the call.   [place], CMA

## 2021-02-26 NOTE — Telephone Encounter (Signed)
Left voice mail asking patient to return my call.

## 2021-04-07 ENCOUNTER — Other Ambulatory Visit: Payer: Self-pay

## 2021-04-07 ENCOUNTER — Other Ambulatory Visit: Payer: Self-pay | Admitting: Nurse Practitioner

## 2021-04-07 DIAGNOSIS — M069 Rheumatoid arthritis, unspecified: Secondary | ICD-10-CM

## 2021-04-07 NOTE — Telephone Encounter (Signed)
Patient came in to the office to discuss refill. Tony Ortega says he has been waiting weeks for this but has had no response from [facility]. We scheduled an appointment for next Tuesday but patient wanted to know why he wasn't able to just talk with her right now. I did advise patient that it would be best to discuss any concerns about medications with an appointment, patient was not happy with this and said he will take the appointment but Zelda was not going to like what he has to say. Advised patient that I will send a message back to see if there is anything we can do for him in the meantime.   Please advise.

## 2021-04-08 ENCOUNTER — Telehealth: Payer: Self-pay | Admitting: Pharmacist

## 2021-04-08 ENCOUNTER — Other Ambulatory Visit (HOSPITAL_COMMUNITY): Payer: Self-pay

## 2021-04-08 NOTE — Telephone Encounter (Signed)
Butch Penny requested fibrosis score and genotype on this patient so she could work on [employer] for [employer]. Called patient to get him scheduled for HCV bloodwork so we could start HCV treatment for him. He said "how can you give me medicine for something you've never seen me for?". I stated we needed to get more bloodwork on him before we could start treatment. He said "I'm not messing with y'all's clinic..." and then hung up.  Alfonse Spruce, PharmD, CPP Clinical Pharmacist Practitioner Infectious [place] for Infectious Disease

## 2021-04-08 NOTE — Telephone Encounter (Signed)
He definitely confirmed he does not want to receive HCV treatment from our clinic at least.

## 2021-04-14 ENCOUNTER — Other Ambulatory Visit: Payer: Self-pay | Admitting: Nurse Practitioner

## 2021-04-14 ENCOUNTER — Other Ambulatory Visit: Payer: Self-pay

## 2021-04-14 DIAGNOSIS — R7989 Other specified abnormal findings of blood chemistry: Secondary | ICD-10-CM

## 2021-04-14 DIAGNOSIS — M069 Rheumatoid arthritis, unspecified: Secondary | ICD-10-CM

## 2021-04-14 DIAGNOSIS — E1142 Type 2 diabetes mellitus with diabetic polyneuropathy: Secondary | ICD-10-CM

## 2021-04-14 DIAGNOSIS — R7303 Prediabetes: Secondary | ICD-10-CM

## 2021-04-14 MED ORDER — PREDNISONE 5 MG PO TABS
5.0000 mg | ORAL_TABLET | Freq: Every day | ORAL | 0 refills | Status: DC
  Filled 2021-04-14: qty 30, 30d supply, fill #0

## 2021-04-14 NOTE — Telephone Encounter (Signed)
Please have him come in for labs. Prednisone has been sent for 30 days. It will not be refilled if he does not have labs done. Thanks ?

## 2021-04-15 ENCOUNTER — Encounter: Payer: Self-pay | Admitting: Nurse Practitioner

## 2021-04-15 ENCOUNTER — Other Ambulatory Visit: Payer: Self-pay

## 2021-04-15 ENCOUNTER — Ambulatory Visit (HOSPITAL_BASED_OUTPATIENT_CLINIC_OR_DEPARTMENT_OTHER): Payer: Medicaid Other | Admitting: Nurse Practitioner

## 2021-04-15 DIAGNOSIS — M069 Rheumatoid arthritis, unspecified: Secondary | ICD-10-CM

## 2021-04-15 NOTE — Progress Notes (Signed)
Virtual Visit via Telephone Note ?Due to national recommendations of social distancing due to [place] 19, telehealth visit is felt to be most appropriate for this patient at this time.  I discussed the limitations, risks, security and privacy concerns of performing an evaluation and management service by telephone and the availability of in person appointments. I also discussed with the patient that there may be a patient responsible charge related to this service. The patient expressed understanding and agreed to proceed.  ? ? ?I connected with Tony Ortega on 04/15/21  at   8:50 AM EST  EDT by telephone and verified that I am speaking with the correct person using two identifiers. ? ?Location of Patient: ?Private Residence ?  ?Location of Provider: ?Scientist, research (physical sciences) and [employer] Office  ?  ?Persons participating in Telemedicine visit: ?Geryl Rankins FNP-BC ?Tony Ortega  ?  ?History of Present Illness: ?Telemedicine visit for: Medication refill for RA ? ?Tony Ortega 61 y.o. male presents to office today refill of prednisone for RA and PO triamcinolone for eczema ?He missed his Rheumatology appointment but states he never got a call from them. ?  ?He has a past medical history of DM2,  Hypertension, Neuropathy, Rheumatoid arthritis and Stroke(09/2018). ? ?He has significant generalized joint pain.  Unfortunately he missed his appointment with rheumatology on October 5 and today states he was never notified from the rheumatologist office of the actual appointment date.  Daily prednisone does provide significant relief of his symptoms however he is aware that this is not a medication that I would be prescribing for him on a long-term basis.  He has not been taking his Lyrica as he states it was ineffective for pain relief ? ? ?Past Medical History:  ?Diagnosis Date  ? Chronic hepatitis C without hepatic coma ([place]) 01/13/2021  ? Diabetes mellitus without complication ([place])   ? Dyspnea   ? Hypertension    ? Neuropathy   ? Rheumatoid arthritis ([place])   ? Stroke [facility]) 09/2018  ? patient thanks he had a stroke in Jan.2022 but no test was done  ? Syphilis 01/13/2021  ?  ?Past Surgical History:  ?Procedure Laterality Date  ? ANTERIOR CERVICAL DECOMP/DISCECTOMY FUSION N/A 03/20/2019  ? Procedure: Cervical four-five Cervical six-seven  Anterior cervical decompression/discectomy/fusion;  Surgeon: Kristeen Miss, MD;  Location: [place] [facility];  Service: Neurosurgery;  Laterality: N/A;  ? KNEE SURGERY Right   ?  ?Family History  ?Problem Relation Age of Onset  ? Diabetes Mother   ? Hypertension Mother   ? Rheum arthritis Mother   ? Other Father   ?     unsure - thinks he may have history of cancer  ? CAD Neg Hx   ? Colon cancer Neg Hx   ? Colon polyps Neg Hx   ? Esophageal cancer Neg Hx   ? Rectal cancer Neg Hx   ? Stomach cancer Neg Hx   ?  ?Social History  ? ?Socioeconomic History  ? Marital status: Married  ?  Spouse name: Not on file  ? Number of children: 2  ? Years of education: 11  ? Highest education level: Not on file  ?Occupational History  ? Occupation: Disabled  ?Tobacco Use  ? Smoking status: Every Day  ?  Packs/day: 0.10  ?  Years: 12.00  ?  Pack years: 1.20  ?  Types: Cigarettes  ? Smokeless tobacco: Never  ?Vaping Use  ? Vaping Use: Never used  ?Substance and Sexual Activity  ?  Alcohol use: Yes  ?  Comment: once a month drink per pt  ? Drug use: Never  ? Sexual activity: Not Currently  ?Other Topics Concern  ? Not on file  ?Social History Narrative  ? Lives at home with mother.  ? Left-handed.  ? He released was released from prison in March 2019.  He served 32 years.  ? 2 cups caffeine per day.  ?   ? ?Social Determinants of Health  ? ?Financial Resource Strain: Not on file  ?Food Insecurity: Not on file  ?Transportation Needs: Not on file  ?Physical Activity: Not on file  ?Stress: Not on file  ?Social Connections: Not on file  ?  ? ?Observations/Objective: ?Awake, alert and oriented x 3 ? ? ?Review of Systems   ?Constitutional:  Negative for fever, malaise/fatigue and weight loss.  ?HENT: Negative.  Negative for nosebleeds.   ?Eyes: Negative.  Negative for blurred vision, double vision and photophobia.  ?Respiratory: Negative.  Negative for cough and shortness of breath.   ?Cardiovascular: Negative.  Negative for chest pain, palpitations and leg swelling.  ?Gastrointestinal: Negative.  Negative for heartburn, nausea and vomiting.  ?Musculoskeletal:  Positive for joint pain. Negative for myalgias.  ?Neurological: Negative.  Negative for dizziness, focal weakness, seizures and headaches.  ?Psychiatric/Behavioral: Negative.  Negative for suicidal ideas.    ?Assessment and Plan: ?Diagnoses and all orders for this visit: ? ?Rheumatoid arthritis involving multiple sites, unspecified whether rheumatoid factor present ([place]) ?Prednisone '5mg'$  daily ?  ? ?Follow Up Instructions ?Return in about 3 months (around 07/16/2021).  ? ?  ?I discussed the assessment and treatment plan with the patient. The patient was provided an opportunity to ask questions and all were answered. The patient agreed with the plan and demonstrated an understanding of the instructions. ?  ?The patient was advised to call back or seek an in-person evaluation if the symptoms worsen or if the condition fails to improve as anticipated. ? ?I provided 10 minutes of non-face-to-face time during this encounter including median intraservice time, reviewing previous notes, labs, imaging, medications and explaining diagnosis and management. ? ?Gildardo Pounds, FNP-BC  ?

## 2021-04-21 ENCOUNTER — Other Ambulatory Visit (HOSPITAL_COMMUNITY): Payer: Self-pay

## 2021-04-22 ENCOUNTER — Other Ambulatory Visit (HOSPITAL_COMMUNITY): Payer: Self-pay

## 2021-04-28 ENCOUNTER — Other Ambulatory Visit (HOSPITAL_COMMUNITY): Payer: Self-pay

## 2021-05-06 ENCOUNTER — Other Ambulatory Visit: Payer: Self-pay

## 2021-05-06 ENCOUNTER — Other Ambulatory Visit: Payer: Self-pay | Admitting: Nurse Practitioner

## 2021-05-06 DIAGNOSIS — L209 Atopic dermatitis, unspecified: Secondary | ICD-10-CM

## 2021-05-07 ENCOUNTER — Other Ambulatory Visit: Payer: Self-pay

## 2021-05-07 MED ORDER — TRIAMCINOLONE ACETONIDE 0.025 % EX OINT
1.0000 "application " | TOPICAL_OINTMENT | Freq: Two times a day (BID) | CUTANEOUS | 1 refills | Status: DC
  Filled 2021-05-07: qty 60, 15d supply, fill #0
  Filled 2021-06-02 – 2021-06-10 (×2): qty 60, 15d supply, fill #1

## 2021-05-09 ENCOUNTER — Other Ambulatory Visit: Payer: Self-pay

## 2021-06-02 ENCOUNTER — Other Ambulatory Visit: Payer: Self-pay

## 2021-06-02 ENCOUNTER — Other Ambulatory Visit: Payer: Self-pay | Admitting: Nurse Practitioner

## 2021-06-02 ENCOUNTER — Other Ambulatory Visit: Payer: Self-pay | Admitting: Family Medicine

## 2021-06-02 DIAGNOSIS — I10 Essential (primary) hypertension: Secondary | ICD-10-CM

## 2021-06-02 DIAGNOSIS — M069 Rheumatoid arthritis, unspecified: Secondary | ICD-10-CM

## 2021-06-02 MED ORDER — AMLODIPINE BESYLATE 10 MG PO TABS
ORAL_TABLET | Freq: Every day | ORAL | 2 refills | Status: DC
  Filled 2021-06-02: qty 30, 30d supply, fill #0
  Filled 2021-07-23 – 2021-07-30 (×2): qty 30, 30d supply, fill #1
  Filled 2021-09-05: qty 30, 30d supply, fill #2

## 2021-06-02 MED ORDER — HYDROCHLOROTHIAZIDE 25 MG PO TABS
ORAL_TABLET | Freq: Every day | ORAL | 2 refills | Status: DC
  Filled 2021-06-02: qty 30, 30d supply, fill #0
  Filled 2021-07-23 – 2021-07-30 (×2): qty 30, 30d supply, fill #1
  Filled 2021-09-05: qty 30, 30d supply, fill #2

## 2021-06-03 ENCOUNTER — Other Ambulatory Visit: Payer: Self-pay

## 2021-06-04 ENCOUNTER — Other Ambulatory Visit: Payer: Self-pay

## 2021-06-09 ENCOUNTER — Other Ambulatory Visit: Payer: Self-pay

## 2021-06-10 ENCOUNTER — Other Ambulatory Visit: Payer: Self-pay

## 2021-06-12 ENCOUNTER — Other Ambulatory Visit: Payer: Self-pay

## 2021-06-20 ENCOUNTER — Other Ambulatory Visit: Payer: Self-pay | Admitting: Nurse Practitioner

## 2021-06-20 ENCOUNTER — Other Ambulatory Visit: Payer: Self-pay

## 2021-06-20 ENCOUNTER — Telehealth: Payer: Self-pay

## 2021-06-20 DIAGNOSIS — M069 Rheumatoid arthritis, unspecified: Secondary | ICD-10-CM

## 2021-06-20 NOTE — Telephone Encounter (Signed)
Called and left vm about getting labs ?

## 2021-07-23 ENCOUNTER — Other Ambulatory Visit: Payer: Self-pay | Admitting: Nurse Practitioner

## 2021-07-23 ENCOUNTER — Other Ambulatory Visit: Payer: Self-pay

## 2021-07-23 DIAGNOSIS — M069 Rheumatoid arthritis, unspecified: Secondary | ICD-10-CM

## 2021-07-23 NOTE — Telephone Encounter (Signed)
Requested medication (s) are due for refill today -provider review   Requested medication (s) are on the active medication list -yes  Future visit scheduled -no  Last refill: 04/14/21 #30  Notes to clinic: non delegated Rx  Requested Prescriptions  Pending Prescriptions Disp Refills   predniSONE (DELTASONE) 5 MG tablet 30 tablet 0    Sig: Take 1 tablet (5 mg total) by mouth daily with breakfast. NO REFILLS     Not Delegated - Endocrinology:  Oral Corticosteroids Failed - 07/23/2021 12:40 PM      Failed - This refill cannot be delegated      Failed - Manual Review: Eye exam for IOP if prolonged treatment      Failed - Glucose (serum) in normal range and within 180 days    Glucose  Date Value Ref Range Status  10/07/2020 166 (H) 65 - 99 mg/dL Final   Glucose, Bld  Date Value Ref Range Status  03/09/2019 122 (H) 70 - 99 mg/dL Final   POC Glucose  Date Value Ref Range Status  10/07/2020 222 (A) 70 - 99 mg/dl Final   Glucose-Capillary  Date Value Ref Range Status  03/21/2019 170 (H) 70 - 99 mg/dL Final         Failed - K in normal range and within 180 days    Potassium  Date Value Ref Range Status  10/07/2020 3.7 3.5 - 5.2 mmol/L Final         Failed - Na in normal range and within 180 days    Sodium  Date Value Ref Range Status  10/07/2020 134 134 - 144 mmol/L Final         Failed - Valid encounter within last 6 months    Recent Outpatient Visits           3 months ago Rheumatoid arthritis involving multiple sites, unspecified whether rheumatoid factor present [facility])   [place], Vernia Buff, NP   6 months ago Potential exposure to STD   [place], [place] W, NP   6 months ago Rheumatoid arthritis involving multiple sites, unspecified whether rheumatoid factor present [facility])   [place], [place] W, NP   7 months ago Rheumatoid arthritis involving multiple  sites with positive rheumatoid factor ([place])   [place], [place] W, NP   9 months ago Abnormal gait   [place], [place] W, NP              Failed - Bone Mineral Density or Dexa Scan completed in the last 2 years      Passed - Last BP in normal range    BP Readings from Last 1 Encounters:  01/13/21 116/75            Requested Prescriptions  Pending Prescriptions Disp Refills   predniSONE (DELTASONE) 5 MG tablet 30 tablet 0    Sig: Take 1 tablet (5 mg total) by mouth daily with breakfast. NO REFILLS     Not Delegated - Endocrinology:  Oral Corticosteroids Failed - 07/23/2021 12:40 PM      Failed - This refill cannot be delegated      Failed - Manual Review: Eye exam for IOP if prolonged treatment      Failed - Glucose (serum) in normal range and within 180 days    Glucose  Date Value Ref Range  Status  10/07/2020 166 (H) 65 - 99 mg/dL Final   Glucose, Bld  Date Value Ref Range Status  03/09/2019 122 (H) 70 - 99 mg/dL Final   POC Glucose  Date Value Ref Range Status  10/07/2020 222 (A) 70 - 99 mg/dl Final   Glucose-Capillary  Date Value Ref Range Status  03/21/2019 170 (H) 70 - 99 mg/dL Final         Failed - K in normal range and within 180 days    Potassium  Date Value Ref Range Status  10/07/2020 3.7 3.5 - 5.2 mmol/L Final         Failed - Na in normal range and within 180 days    Sodium  Date Value Ref Range Status  10/07/2020 134 134 - 144 mmol/L Final         Failed - Valid encounter within last 6 months    Recent Outpatient Visits           3 months ago Rheumatoid arthritis involving multiple sites, unspecified whether rheumatoid factor present [facility])   [place], Vernia Buff, NP   6 months ago Potential exposure to STD   [place], [place] W, NP   6 months ago Rheumatoid arthritis involving  multiple sites, unspecified whether rheumatoid factor present [facility])   [place], [place] W, NP   7 months ago Rheumatoid arthritis involving multiple sites with positive rheumatoid factor [facility])   [place], [place] W, NP   9 months ago Abnormal gait   [place], [place] W, NP              Failed - Bone Mineral Density or Dexa Scan completed in the last 2 years      Passed - Last BP in normal range    BP Readings from Last 1 Encounters:  01/13/21 116/75

## 2021-07-25 ENCOUNTER — Other Ambulatory Visit: Payer: Self-pay

## 2021-07-30 ENCOUNTER — Other Ambulatory Visit: Payer: Self-pay

## 2021-07-31 ENCOUNTER — Other Ambulatory Visit: Payer: Self-pay

## 2021-09-05 ENCOUNTER — Other Ambulatory Visit: Payer: Self-pay

## 2021-09-05 ENCOUNTER — Other Ambulatory Visit (HOSPITAL_COMMUNITY): Payer: Self-pay

## 2021-09-09 ENCOUNTER — Other Ambulatory Visit: Payer: Self-pay | Admitting: Nurse Practitioner

## 2021-09-09 ENCOUNTER — Other Ambulatory Visit: Payer: Self-pay

## 2021-09-09 DIAGNOSIS — M069 Rheumatoid arthritis, unspecified: Secondary | ICD-10-CM

## 2021-09-10 ENCOUNTER — Other Ambulatory Visit: Payer: Self-pay

## 2022-02-05 ENCOUNTER — Other Ambulatory Visit: Payer: Self-pay

## 2022-02-05 ENCOUNTER — Other Ambulatory Visit: Payer: Self-pay | Admitting: Nurse Practitioner

## 2022-02-05 DIAGNOSIS — I10 Essential (primary) hypertension: Secondary | ICD-10-CM

## 2022-02-05 DIAGNOSIS — M2550 Pain in unspecified joint: Secondary | ICD-10-CM

## 2022-02-05 MED ORDER — HYDROCHLOROTHIAZIDE 25 MG PO TABS
ORAL_TABLET | Freq: Every day | ORAL | 0 refills | Status: DC
  Filled 2022-02-05: qty 30, 30d supply, fill #0

## 2022-02-05 MED ORDER — AMLODIPINE BESYLATE 10 MG PO TABS
ORAL_TABLET | Freq: Every day | ORAL | 0 refills | Status: DC
  Filled 2022-02-05: qty 30, 30d supply, fill #0

## 2022-02-06 ENCOUNTER — Other Ambulatory Visit: Payer: Self-pay

## 2022-02-06 MED ORDER — PREGABALIN 75 MG PO CAPS
75.0000 mg | ORAL_CAPSULE | Freq: Two times a day (BID) | ORAL | 0 refills | Status: DC
  Filled 2022-02-06: qty 60, 30d supply, fill #0

## 2022-02-12 ENCOUNTER — Other Ambulatory Visit: Payer: Self-pay

## 2022-03-04 ENCOUNTER — Other Ambulatory Visit: Payer: Self-pay

## 2022-03-04 ENCOUNTER — Encounter: Payer: Self-pay | Admitting: Nurse Practitioner

## 2022-03-04 ENCOUNTER — Ambulatory Visit: Payer: Medicaid Other | Attending: Nurse Practitioner | Admitting: Nurse Practitioner

## 2022-03-04 ENCOUNTER — Other Ambulatory Visit (HOSPITAL_COMMUNITY): Payer: Self-pay

## 2022-03-04 VITALS — BP 144/84 | HR 78 | Ht 66.0 in | Wt 124.0 lb

## 2022-03-04 DIAGNOSIS — M069 Rheumatoid arthritis, unspecified: Secondary | ICD-10-CM | POA: Diagnosis not present

## 2022-03-04 DIAGNOSIS — B182 Chronic viral hepatitis C: Secondary | ICD-10-CM | POA: Diagnosis not present

## 2022-03-04 DIAGNOSIS — E1142 Type 2 diabetes mellitus with diabetic polyneuropathy: Secondary | ICD-10-CM

## 2022-03-04 DIAGNOSIS — M2550 Pain in unspecified joint: Secondary | ICD-10-CM

## 2022-03-04 DIAGNOSIS — I10 Essential (primary) hypertension: Secondary | ICD-10-CM

## 2022-03-04 DIAGNOSIS — R7989 Other specified abnormal findings of blood chemistry: Secondary | ICD-10-CM

## 2022-03-04 DIAGNOSIS — E1169 Type 2 diabetes mellitus with other specified complication: Secondary | ICD-10-CM

## 2022-03-04 DIAGNOSIS — E785 Hyperlipidemia, unspecified: Secondary | ICD-10-CM

## 2022-03-04 DIAGNOSIS — N529 Male erectile dysfunction, unspecified: Secondary | ICD-10-CM

## 2022-03-04 DIAGNOSIS — K219 Gastro-esophageal reflux disease without esophagitis: Secondary | ICD-10-CM

## 2022-03-04 DIAGNOSIS — Z7982 Long term (current) use of aspirin: Secondary | ICD-10-CM

## 2022-03-04 DIAGNOSIS — G959 Disease of spinal cord, unspecified: Secondary | ICD-10-CM

## 2022-03-04 MED ORDER — OMEPRAZOLE 20 MG PO CPDR
20.0000 mg | DELAYED_RELEASE_CAPSULE | Freq: Every day | ORAL | 11 refills | Status: AC
  Filled 2022-03-04: qty 90, fill #0
  Filled 2022-03-11 – 2022-03-20 (×2): qty 30, 30d supply, fill #0

## 2022-03-04 MED ORDER — SILDENAFIL CITRATE 100 MG PO TABS
100.0000 mg | ORAL_TABLET | Freq: Every day | ORAL | 2 refills | Status: DC | PRN
  Filled 2022-03-04 – 2022-03-20 (×3): qty 10, 30d supply, fill #0

## 2022-03-04 MED ORDER — AMLODIPINE BESYLATE 10 MG PO TABS
10.0000 mg | ORAL_TABLET | Freq: Every day | ORAL | 1 refills | Status: DC
  Filled 2022-03-04 – 2022-03-20 (×3): qty 90, 90d supply, fill #0
  Filled 2022-07-20: qty 90, 90d supply, fill #1

## 2022-03-04 MED ORDER — PREGABALIN 75 MG PO CAPS
75.0000 mg | ORAL_CAPSULE | Freq: Two times a day (BID) | ORAL | 3 refills | Status: AC
  Filled 2022-03-04 – 2022-03-20 (×3): qty 60, 30d supply, fill #0

## 2022-03-04 MED ORDER — ATORVASTATIN CALCIUM 20 MG PO TABS
20.0000 mg | ORAL_TABLET | Freq: Every day | ORAL | 1 refills | Status: DC
  Filled 2022-03-04 – 2022-03-20 (×3): qty 90, 90d supply, fill #0

## 2022-03-04 MED ORDER — HYDROCHLOROTHIAZIDE 25 MG PO TABS
25.0000 mg | ORAL_TABLET | Freq: Every day | ORAL | 1 refills | Status: DC
  Filled 2022-03-04 – 2022-03-20 (×3): qty 90, 90d supply, fill #0
  Filled 2022-07-20: qty 90, 90d supply, fill #1

## 2022-03-04 MED ORDER — METFORMIN HCL 500 MG PO TABS
500.0000 mg | ORAL_TABLET | Freq: Every day | ORAL | 1 refills | Status: AC
  Filled 2022-03-04 – 2022-03-20 (×3): qty 90, 90d supply, fill #0

## 2022-03-04 NOTE — Progress Notes (Signed)
Assessment & Plan:  Tony Ortega was seen today for diabetes and hypertension.  Diagnoses and all orders for this visit:  Type 2 diabetes, controlled, with peripheral neuropathy ([place]) -     Ambulatory referral to Ophthalmology -     metFORMIN (GLUCOPHAGE) 500 MG tablet; Take 1 tablet (500 mg total) by mouth daily with breakfast. FOR DIABETES -     Hemoglobin A1c -     CMP14+EGFR -     Cancel: Microalbumin / creatinine urine ratio  Rheumatoid arthritis involving multiple sites, unspecified whether rheumatoid factor present ([place]) -     Ambulatory referral to Rheumatology  Chronic hepatitis C without hepatic coma ([place]) -     HCV RNA quant -     Ambulatory referral to Infectious Disease -     HCV RNA (International Units)  Primary hypertension -     hydrochlorothiazide (HYDRODIURIL) 25 MG tablet; Take 1 tablet (25 mg total) by mouth daily. FOR BLOOD PRESSURE -     amLODipine (NORVASC) 10 MG tablet; Take 1 tablet (10 mg total) by mouth daily. FOR BLOOD PRESSURE  Hyperlipidemia associated with type 2 diabetes mellitus (HCC) -     atorvastatin (LIPITOR) 20 MG tablet; Take 1 tablet (20 mg total) by mouth daily. FOR CHOLESTEROL  Gastroesophageal reflux disease, unspecified whether esophagitis present -     omeprazole (PRILOSEC) 20 MG capsule; Take 1 capsule (20 mg total) by mouth daily to reduce stomach acid  Arthralgia of multiple joints -     pregabalin (LYRICA) 75 MG capsule; Take 1 capsule (75 mg total) by mouth 2 (two) times daily. FOR PAIN  Abnormal CBC -     CBC with Differential  Erectile dysfunction, unspecified erectile dysfunction type -     sildenafil (VIAGRA) 100 MG tablet; TAKE ONE TABLET BY MOUTH DAILY AS NEEDED FOR ERECTILE DYSFUNCTION    Patient has been counseled on age-appropriate routine health concerns for screening and prevention. These are reviewed and up-to-date. Referrals have been placed accordingly. Immunizations are up-to-date or declined.    Subjective:    Chief Complaint  Patient presents with   Diabetes   Hypertension   HPI Tony Ortega 62 y.o. male presents to office today for follow up to DM, HTN, HPL.  HTN Blood pressure is elevated today. He had ben out of his blood pressure medications for quite some time and recently restarted them last month.  BP Readings from Last 3 Encounters:  03/04/22 (!) 144/84  01/13/21 116/75  01/01/21 117/80    DM is well controlled.  Lab Results  Component Value Date   HGBA1C 6.5 (H) 03/04/2022   CHRONIC HEP C Did not adhere to any treatment plan. I explained the long term effects of HEP if no treated. I have instructed him to follow up the ID as soon as possible for treatment completion  Rheumatoid arthritis I have referred him on numerous occasions for f/u to RA. I will no longer prescribe chronic steroid treatment for this and he has been instructed that rheumatology will need to begin long term therapy  Review of Systems  Constitutional:  Negative for fever, malaise/fatigue and weight loss.  HENT: Negative.  Negative for nosebleeds.   Eyes: Negative.  Negative for blurred vision, double vision and photophobia.  Respiratory: Negative.  Negative for cough and shortness of breath.   Cardiovascular: Negative.  Negative for chest pain, palpitations and leg swelling.  Gastrointestinal: Negative.  Negative for heartburn, nausea and vomiting.  Musculoskeletal:  Positive  for joint pain. Negative for myalgias.  Neurological: Negative.  Negative for dizziness, focal weakness, seizures and headaches.  Psychiatric/Behavioral: Negative.  Negative for suicidal ideas.     Past Medical History:  Diagnosis Date   Chronic hepatitis C without hepatic coma ([place]) 01/13/2021   Diabetes mellitus without complication ([place])    Dyspnea    Hypertension    Neuropathy    Rheumatoid arthritis ([place])    Stroke ([place]) 09/2018   patient thanks he had a stroke in Jan.2022 but no test was done   Syphilis 01/13/2021     Past Surgical History:  Procedure Laterality Date   ANTERIOR CERVICAL DECOMP/DISCECTOMY FUSION N/A 03/20/2019   Procedure: Cervical four-five Cervical six-seven  Anterior cervical decompression/discectomy/fusion;  Surgeon: Kristeen Miss, MD;  Location: [facility];  Service: Neurosurgery;  Laterality: N/A;   KNEE SURGERY Right     Family History  Problem Relation Age of Onset   Diabetes Mother    Hypertension Mother    Rheum arthritis Mother    Other Father        unsure - thinks he may have history of cancer   CAD Neg Hx    Colon cancer Neg Hx    Colon polyps Neg Hx    Esophageal cancer Neg Hx    Rectal cancer Neg Hx    Stomach cancer Neg Hx     Social History Reviewed with no changes to be made today.   Outpatient Medications Prior to Visit  Medication Sig Dispense Refill   ACCU-CHEK FASTCLIX LANCETS MISC Use as directed twice daily to test blood sugar. 102 each 12   Blood Glucose Monitoring Suppl (ACCU-CHEK GUIDE) w/Device KIT 1 each by Does not apply route 2 (two) times daily. Use as directed twice daily to test blood sugar. E11.42 1 kit 0   amLODipine (NORVASC) 10 MG tablet TAKE 1 TABLET (10 MG TOTAL) BY MOUTH DAILY. TO LOWER BLOOD PRESSURE 30 tablet 0   hydrochlorothiazide (HYDRODIURIL) 25 MG tablet TAKE 1 TABLET (25 MG TOTAL) BY MOUTH DAILY. 30 tablet 0   atorvastatin (LIPITOR) 20 MG tablet TAKE 1 TABLET (20 MG TOTAL) BY MOUTH DAILY. 90 tablet 1   cabotegravir ER (APRETUDE) 600 MG/3ML injection Inject 3 mLs (600 mg total) into the muscle every 2 (two) months. (Patient not taking: Reported on 03/04/2022) 3 mL 5   cabotegravir ER (APRETUDE) 600 MG/3ML injection Inject 3 mLs (600 mg total) into the muscle every 30 (thirty) days. (Patient not taking: Reported on 03/04/2022) 3 mL 1   Elastic Bandages & Supports (KNEE BRACE) MISC Please provide patient with insurance approved left knee brace. R29.6; M06.9 (Patient not taking: Reported on 03/04/2022) 1 each 0   metFORMIN (GLUCOPHAGE) 500  MG tablet TAKE 1 TABLET (500 MG TOTAL) BY MOUTH DAILY WITH BREAKFAST. (Patient not taking: Reported on 01/01/2021) 90 tablet 1   omeprazole (PRILOSEC) 20 MG capsule TAKE 1 CAPSULE (20 MG TOTAL) BY MOUTH DAILY. TO REDUCE STOMACH ACID 90 capsule 4   predniSONE (DELTASONE) 5 MG tablet Take 1 tablet (5 mg total) by mouth daily with breakfast. NO REFILLS (Patient not taking: Reported on 03/04/2022) 30 tablet 0   pregabalin (LYRICA) 75 MG capsule Take 1 capsule (75 mg total) by mouth 2 (two) times daily. (Patient not taking: Reported on 03/04/2022) 60 capsule 0   sildenafil (VIAGRA) 100 MG tablet TAKE ONE TABLET BY MOUTH DAILY AS NEEDED FOR ERECTILE DYSFUNCTION (Patient not taking: Reported on 03/04/2022) 10 tablet 2   triamcinolone (  KENALOG) 0.025 % ointment Apply 1 application topically 2 (two) times daily. (Patient not taking: Reported on 03/04/2022) 60 g 1   Penicillin G Benzathine SUSP 2,400,000 Units      No facility-administered medications prior to visit.    Allergies  Allergen Reactions   Pork-Derived Products Other (See Comments)    No pork products       Objective:    BP (!) 144/84   Pulse 78   Ht '5\' 6"'$  (1.676 m)   Wt 124 lb (56.2 kg)   SpO2 98%   BMI 20.01 kg/m  Wt Readings from Last 3 Encounters:  03/04/22 124 lb (56.2 kg)  01/13/21 130 lb 6.4 oz (59.1 kg)  01/01/21 129 lb 8 oz (58.7 kg)    Physical Exam Vitals and nursing note reviewed.  Constitutional:      Appearance: He is well-developed.  HENT:     Head: Normocephalic and atraumatic.  Cardiovascular:     Rate and Rhythm: Normal rate and regular rhythm.     Heart sounds: Normal heart sounds. No murmur heard.    No friction rub. No gallop.  Pulmonary:     Effort: Pulmonary effort is normal. No tachypnea or respiratory distress.     Breath sounds: Normal breath sounds. No decreased breath sounds, wheezing, rhonchi or rales.  Chest:     Chest wall: No tenderness.  Abdominal:     General: Bowel sounds are normal.      Palpations: Abdomen is soft.  Musculoskeletal:        General: Normal range of motion.     Cervical back: Normal range of motion.  Skin:    General: Skin is warm and dry.  Neurological:     Mental Status: He is alert and oriented to person, place, and time.     Coordination: Coordination normal.  Psychiatric:        Behavior: Behavior normal. Behavior is cooperative.        Thought Content: Thought content normal.        Judgment: Judgment normal.          Patient has been counseled extensively about nutrition and exercise as well as the importance of adherence with medications and regular follow-up. The patient was given clear instructions to go to ER or return to medical center if symptoms don't improve, worsen or new problems develop. The patient verbalized understanding.   Follow-up: Return in about 3 months (around 06/03/2022).   Gildardo Pounds, FNP-BC [facility] and [place], [place]   03/08/2022, 6:34 PM

## 2022-03-04 NOTE — Progress Notes (Signed)
Blood pressure, diabetes, blurry vision, nerve pain.

## 2022-03-05 LAB — CBC WITH DIFFERENTIAL/PLATELET
Basophils Absolute: 0.1 10*3/uL (ref 0.0–0.2)
Basos: 1 %
EOS (ABSOLUTE): 0.2 10*3/uL (ref 0.0–0.4)
Eos: 4 %
Hematocrit: 46.2 % (ref 37.5–51.0)
Hemoglobin: 16.6 g/dL (ref 13.0–17.7)
Immature Grans (Abs): 0 10*3/uL (ref 0.0–0.1)
Immature Granulocytes: 0 %
Lymphocytes Absolute: 2.6 10*3/uL (ref 0.7–3.1)
Lymphs: 42 %
MCH: 31.6 pg (ref 26.6–33.0)
MCHC: 35.9 g/dL — ABNORMAL HIGH (ref 31.5–35.7)
MCV: 88 fL (ref 79–97)
Monocytes Absolute: 0.6 10*3/uL (ref 0.1–0.9)
Monocytes: 9 %
Neutrophils Absolute: 2.8 10*3/uL (ref 1.4–7.0)
Neutrophils: 44 %
Platelets: 251 10*3/uL (ref 150–450)
RBC: 5.26 x10E6/uL (ref 4.14–5.80)
RDW: 11.9 % (ref 11.6–15.4)
WBC: 6.3 10*3/uL (ref 3.4–10.8)

## 2022-03-05 LAB — CMP14+EGFR
ALT: 31 IU/L (ref 0–44)
AST: 48 IU/L — ABNORMAL HIGH (ref 0–40)
Albumin/Globulin Ratio: 1.3 (ref 1.2–2.2)
Albumin: 4.4 g/dL (ref 3.9–4.9)
Alkaline Phosphatase: 116 IU/L (ref 44–121)
BUN/Creatinine Ratio: 7 — ABNORMAL LOW (ref 10–24)
BUN: 9 mg/dL (ref 8–27)
Bilirubin Total: 0.6 mg/dL (ref 0.0–1.2)
CO2: 23 mmol/L (ref 20–29)
Calcium: 10 mg/dL (ref 8.6–10.2)
Chloride: 97 mmol/L (ref 96–106)
Creatinine, Ser: 1.21 mg/dL (ref 0.76–1.27)
Globulin, Total: 3.5 g/dL (ref 1.5–4.5)
Glucose: 133 mg/dL — ABNORMAL HIGH (ref 70–99)
Potassium: 4 mmol/L (ref 3.5–5.2)
Sodium: 136 mmol/L (ref 134–144)
Total Protein: 7.9 g/dL (ref 6.0–8.5)
eGFR: 67 mL/min/{1.73_m2} (ref >59)

## 2022-03-05 LAB — HCV RNA (INTERNATIONAL UNITS)
HCV RNA (International Units): 13800000 IU/mL
HCV log10: 7.14 log10 IU/mL

## 2022-03-05 LAB — HEMOGLOBIN A1C
Est. average glucose Bld gHb Est-mCnc: 140 mg/dL
Hgb A1c MFr Bld: 6.5 % — ABNORMAL HIGH (ref 4.8–5.6)

## 2022-03-05 LAB — HCV RNA QUANT

## 2022-03-08 ENCOUNTER — Encounter: Payer: Self-pay | Admitting: Nurse Practitioner

## 2022-03-11 ENCOUNTER — Other Ambulatory Visit: Payer: Self-pay

## 2022-03-11 ENCOUNTER — Other Ambulatory Visit: Payer: Self-pay | Admitting: Nurse Practitioner

## 2022-03-11 DIAGNOSIS — E1142 Type 2 diabetes mellitus with diabetic polyneuropathy: Secondary | ICD-10-CM

## 2022-03-13 LAB — MICROALBUMIN / CREATININE URINE RATIO
Creatinine, Urine: 170.1 mg/dL
Microalb/Creat Ratio: 37 mg/g creat — ABNORMAL HIGH (ref 0–29)
Microalbumin, Urine: 63.7 ug/mL

## 2022-03-17 ENCOUNTER — Other Ambulatory Visit: Payer: Self-pay | Admitting: Nurse Practitioner

## 2022-03-17 DIAGNOSIS — N529 Male erectile dysfunction, unspecified: Secondary | ICD-10-CM

## 2022-03-18 ENCOUNTER — Encounter: Payer: Medicaid Other | Admitting: Internal Medicine

## 2022-03-18 ENCOUNTER — Other Ambulatory Visit: Payer: Self-pay

## 2022-03-18 NOTE — Telephone Encounter (Deleted)
***   Pharmacy called and spoke to *** about the refill(s) *** requested. Advised it was sent on *** #***/*** refill(s).

## 2022-03-20 ENCOUNTER — Telehealth: Payer: Self-pay | Admitting: Nurse Practitioner

## 2022-03-20 ENCOUNTER — Other Ambulatory Visit: Payer: Self-pay

## 2022-03-20 ENCOUNTER — Other Ambulatory Visit: Payer: Self-pay | Admitting: Nurse Practitioner

## 2022-03-20 DIAGNOSIS — M069 Rheumatoid arthritis, unspecified: Secondary | ICD-10-CM

## 2022-03-20 MED ORDER — CANE MISC: 0 refills | Status: AC

## 2022-03-20 NOTE — Telephone Encounter (Signed)
Unable to reach patient by phone.  Can't leave voicemail.

## 2022-03-20 NOTE — Telephone Encounter (Signed)
Order placed. I can not guarantee medicaid will pay for a cane

## 2022-03-20 NOTE — Telephone Encounter (Signed)
Patient came in this morning requesting a cane or walker.

## 2022-03-23 ENCOUNTER — Telehealth: Payer: Self-pay | Admitting: Emergency Medicine

## 2022-03-23 ENCOUNTER — Telehealth: Payer: Self-pay | Admitting: Nurse Practitioner

## 2022-03-23 NOTE — Telephone Encounter (Signed)
Tony Ortega from [employer] wanted PCP to know pt declined getting cane because he would have to be private pay due to him already having the rollator.

## 2022-03-23 NOTE — Telephone Encounter (Signed)
Copied from [place] [id number]. Topic: General - Other >> Mar 23, 2022 10:24 AM Everette C wrote: discussedReason for CRM: Samantha with [employer] has called for an update on previously requested paperwork for the patient   [facility]ona Bar would like to know if the cane requested for the patient is single point or quad point  [facility] Bar would also like office notes related to the prescription for the cane   Please fax info to [phone]    Patient acct [id number]

## 2022-03-24 NOTE — Telephone Encounter (Signed)
Call returned and questions answered

## 2022-03-25 NOTE — Telephone Encounter (Signed)
FYI

## 2022-04-01 ENCOUNTER — Ambulatory Visit: Payer: Medicaid Other | Admitting: Internal Medicine

## 2022-04-09 ENCOUNTER — Other Ambulatory Visit: Payer: Self-pay | Admitting: Nurse Practitioner

## 2022-04-09 DIAGNOSIS — N529 Male erectile dysfunction, unspecified: Secondary | ICD-10-CM

## 2022-04-23 ENCOUNTER — Other Ambulatory Visit: Payer: Self-pay

## 2022-04-23 NOTE — Progress Notes (Signed)
Patient outreached by Donney Rankins, PharmD Candidate on 03/14/204 to discuss hypertension.    Patient has an automated home blood pressure machine.  He has a wrist cuff.  He reports home readings as systolic [id number] and diastolic as [id number].  He endorses medication adherence.  As far as his diet, he will eat pancakes, eggs, boiled egg sandwich for breakfast, seafood for lunch, and vegetables and some form of protein for dinner.  He also incorporates fruit into his diet.  He typically drinks water and juice.  He rarely drinks alcohol.  He does smoke marijuana daily.  For exercise, he walks 15-20 minutes daily, but is limited because of his legs.  He plans to discuss this at his next PCP visit.  Medication review was performed. They are taking medications as prescribed.   The following barriers to adherence were noted:  - They do not have cost concerns.  - They do not have transportation concerns.  - They do not need assistance obtaining refills.  - They do not occasionally forget to take some of their prescribed medications.  - They do not feel like one/some of their medications make them feel poorly.  - They do not have questions or concerns about their medications.  - They do have follow up scheduled with their primary care provider/cardiologist.   The following interventions were completed:  - Medications were reviewed  - Patient was counseled on lifestyle modifications to improve blood pressure, including exercise   The patient has follow up scheduled: 06/03/2022  PCP: [facility]  PharmD Candidate 2024      Maryan Puls, PharmD PGY-1 [facility] Resident

## 2022-05-12 ENCOUNTER — Other Ambulatory Visit: Payer: Self-pay

## 2022-06-03 ENCOUNTER — Other Ambulatory Visit: Payer: Self-pay | Admitting: Nurse Practitioner

## 2022-06-03 ENCOUNTER — Other Ambulatory Visit: Payer: Self-pay

## 2022-06-03 ENCOUNTER — Ambulatory Visit: Payer: Medicaid Other | Attending: Nurse Practitioner | Admitting: Nurse Practitioner

## 2022-06-03 ENCOUNTER — Encounter: Payer: Self-pay | Admitting: Nurse Practitioner

## 2022-06-03 VITALS — BP 131/78 | HR 64 | Ht 66.0 in | Wt 126.0 lb

## 2022-06-03 DIAGNOSIS — F419 Anxiety disorder, unspecified: Secondary | ICD-10-CM

## 2022-06-03 DIAGNOSIS — F32A Depression, unspecified: Secondary | ICD-10-CM

## 2022-06-03 DIAGNOSIS — E1142 Type 2 diabetes mellitus with diabetic polyneuropathy: Secondary | ICD-10-CM

## 2022-06-03 DIAGNOSIS — W19XXXD Unspecified fall, subsequent encounter: Secondary | ICD-10-CM

## 2022-06-03 DIAGNOSIS — M069 Rheumatoid arthritis, unspecified: Secondary | ICD-10-CM

## 2022-06-03 NOTE — Progress Notes (Signed)
Patient states that he keeps falling.  Referral to podiatry for heel pain.  Referral to psych.

## 2022-06-03 NOTE — Progress Notes (Signed)
Assessment & Plan:  Tony Ortega was seen today for hypertension.  Diagnoses and all orders for this visit:  Type 2 diabetes, controlled, with peripheral neuropathy REFUSED LABS -     Ambulatory referral to Podiatry -     Hemoglobin A1c -     CMP14+EGFR  Fall, subsequent encounter -     Ambulatory referral to Physical Therapy  Anxiety and depression -     Ambulatory referral to Behavioral Health    Patient has been counseled on age-appropriate routine health concerns for screening and prevention. These are reviewed and up-to-date. Referrals have been placed accordingly. Immunizations are up-to-date or declined.    Subjective:   Chief Complaint  Patient presents with   Hypertension   Hypertension Pertinent negatives include no blurred vision, chest pain, headaches, malaise/fatigue, palpitations or shortness of breath.   Tony Ortega 62 y.o. male presents to office today for follow up to HTN. He has dark sunglasses on and a plastic glove on his hand. Does not face me or give me any eye contact.   He has a history of RA. Has appt with rheumatology next month. He was made aware of this appt today as well.    Rheumatoid arthritis I have referred him on numerous occasions for f/u to RA. I will no longer prescribe chronic steroid treatment for this and he has been instructed that rheumatology will need to begin long term therapy    He is very angry that I am refusing to refill his prednisone today. States there is going to be a problem if I don't refill this for him today. When I asked what kind of problem he said you will see. He then stated he would not be getting any bloodwork today and asked if he could leave the visit. He was escorted out by the CMA and given his AVS. He is also refusing to see ID and has not been treated for HEP C.    CHRONIC HEP C Did not adhere to any treatment plan. I explained the long term effects of HEP if no treated. I have instructed him to follow  up the ID as soon as possible for treatment completion HTN Blood pressure is well controlled. Currently prescribed HCTZ 25 mg daily and amlodipine 10 mg daily.  BP Readings from Last 3 Encounters:  06/03/22 131/78  03/04/22 (!) 144/84  01/13/21 116/75    Chronic bilateral heel pain L>R. Onset several months ago. Unrelated to injury. He is not forthcoming with any additional information regarding symptoms as he is upset I am not prescribing him prednisone today.   Endorses frequent falls. In the past he has worked with physical therapy and stated his falls/weakness were related to a previous stroke. Today he tells me his falls are due to me not giving him his prednisone. When I asked if his falls were related to weakness or pain he continues to speak over me telling me he is not going to keep telling me the same thing over and over if I am not going to give him prednisone.     Review of Systems  Constitutional:  Negative for fever, malaise/fatigue and weight loss.  HENT: Negative.  Negative for nosebleeds.   Eyes: Negative.  Negative for blurred vision, double vision and photophobia.  Respiratory: Negative.  Negative for cough and shortness of breath.   Cardiovascular: Negative.  Negative for chest pain, palpitations and leg swelling.  Gastrointestinal: Negative.  Negative for heartburn, nausea and vomiting.  Musculoskeletal:  Positive for falls and joint pain. Negative for myalgias.  Neurological: Negative.  Negative for dizziness, focal weakness, seizures and headaches.  Psychiatric/Behavioral:  Positive for depression. Negative for suicidal ideas. The patient is nervous/anxious.     Past Medical History:  Diagnosis Date   Chronic hepatitis C without hepatic coma 01/13/2021   Diabetes mellitus without complication    Dyspnea    Hypertension    Neuropathy    Rheumatoid arthritis    Stroke 09/2018   patient thanks he had a stroke in Jan.2022 but no test was done   Syphilis  01/13/2021    Past Surgical History:  Procedure Laterality Date   ANTERIOR CERVICAL DECOMP/DISCECTOMY FUSION N/A 03/20/2019   Procedure: Cervical four-five Cervical six-seven  Anterior cervical decompression/discectomy/fusion;  Surgeon: Barnett Abu, MD;  Location: [facility] OR;  Service: Neurosurgery;  Laterality: N/A;   KNEE SURGERY Right     Family History  Problem Relation Age of Onset   Diabetes Mother    Hypertension Mother    Rheum arthritis Mother    Other Father        unsure - thinks he may have history of cancer   CAD Neg Hx    Colon cancer Neg Hx    Colon polyps Neg Hx    Esophageal cancer Neg Hx    Rectal cancer Neg Hx    Stomach cancer Neg Hx     Social History Reviewed with no changes to be made today.   Outpatient Medications Prior to Visit  Medication Sig Dispense Refill   ACCU-CHEK FASTCLIX LANCETS MISC Use as directed twice daily to test blood sugar. 102 each 12   amLODipine (NORVASC) 10 MG tablet Take 1 tablet (10 mg total) by mouth daily. FOR BLOOD PRESSURE 90 tablet 1   atorvastatin (LIPITOR) 20 MG tablet Take 1 tablet (20 mg total) by mouth daily. FOR CHOLESTEROL 90 tablet 1   Blood Glucose Monitoring Suppl (ACCU-CHEK GUIDE) w/Device KIT 1 each by Does not apply route 2 (two) times daily. Use as directed twice daily to test blood sugar. E11.42 1 kit 0   hydrochlorothiazide (HYDRODIURIL) 25 MG tablet Take 1 tablet (25 mg total) by mouth daily. FOR BLOOD PRESSURE 90 tablet 1   omeprazole (PRILOSEC) 20 MG capsule Take 1 capsule (20 mg total) by mouth daily to reduce stomach acid 30 capsule 11   sildenafil (VIAGRA) 100 MG tablet TAKE 1 TABLET BY MOUTH DAILY AS NEEDED FOR ERECTILE DYSFUNCTION 10 tablet 2   metFORMIN (GLUCOPHAGE) 500 MG tablet Take 1 tablet (500 mg total) by mouth daily with breakfast. FOR DIABETES (Patient not taking: Reported on 06/03/2022) 90 tablet 1   Misc. Devices (CANE) MISC Patient requests straight cane ICD 10 M06.9 (Patient not taking: Reported  on 06/03/2022) 1 each 0   pregabalin (LYRICA) 75 MG capsule Take 1 capsule (75 mg total) by mouth 2 (two) times daily. FOR PAIN (Patient not taking: Reported on 06/03/2022) 60 capsule 3   No facility-administered medications prior to visit.    Allergies  Allergen Reactions   Pork-Derived Products Other (See Comments)    No pork products       Objective:    BP 131/78 (BP Location: Left Arm, Patient Position: Sitting, Cuff Size: Small)   Pulse 64   Ht 5\' 6"  (1.676 m)   Wt 126 lb (57.2 kg)   SpO2 98%   BMI 20.34 kg/m  Wt Readings from Last 3 Encounters:  06/03/22 126 lb (57.2 kg)  03/04/22 124 lb (56.2 kg)  01/13/21 130 lb 6.4 oz (59.1 kg)    Physical Exam Vitals and nursing note reviewed.  Constitutional:      Appearance: He is well-developed.  HENT:     Head: Normocephalic and atraumatic.  Cardiovascular:     Rate and Rhythm: Normal rate and regular rhythm.     Heart sounds: Normal heart sounds. No murmur heard.    No friction rub. No gallop.  Pulmonary:     Effort: Pulmonary effort is normal. No tachypnea or respiratory distress.     Breath sounds: Normal breath sounds. No decreased breath sounds, wheezing, rhonchi or rales.  Chest:     Chest wall: No tenderness.  Abdominal:     General: Bowel sounds are normal.     Palpations: Abdomen is soft.  Musculoskeletal:        General: Normal range of motion.     Cervical back: Normal range of motion.  Skin:    General: Skin is warm and dry.  Neurological:     Mental Status: He is alert and oriented to person, place, and time.     Coordination: Coordination normal.  Psychiatric:        Behavior: Behavior normal. Behavior is cooperative.        Thought Content: Thought content normal.        Judgment: Judgment normal.          Patient has been counseled extensively about nutrition and exercise as well as the importance of adherence with medications and regular follow-up. The patient was given clear instructions to  go to ER or return to medical center if symptoms don't improve, worsen or new problems develop. The patient verbalized understanding.   Follow-up: Return in about 6 months (around 12/03/2022).   Claiborne Rigg, FNP-BC [facility] [place], [place] [phone]   06/03/2022, 2:59 PM

## 2022-06-10 ENCOUNTER — Other Ambulatory Visit: Payer: Self-pay

## 2022-06-11 ENCOUNTER — Ambulatory Visit: Payer: Medicaid Other | Admitting: Podiatry

## 2022-06-12 ENCOUNTER — Ambulatory Visit: Payer: Medicaid Other | Admitting: Podiatry

## 2022-06-15 ENCOUNTER — Ambulatory Visit: Payer: Medicaid Other | Admitting: Podiatry

## 2022-06-19 ENCOUNTER — Ambulatory Visit: Payer: Medicaid Other | Admitting: Podiatry

## 2022-06-21 ENCOUNTER — Other Ambulatory Visit: Payer: Self-pay | Admitting: Nurse Practitioner

## 2022-06-21 DIAGNOSIS — N529 Male erectile dysfunction, unspecified: Secondary | ICD-10-CM

## 2022-06-22 NOTE — Telephone Encounter (Signed)
Requested medication (s) are due for refill today - provider review   Requested medication (s) are on the active medication list -yes  Future visit scheduled -yes  Last refill: 04/10/22 #10 2RF  Notes to clinic: not sure what provider feels is 1 month supply for patient- sent for review   Requested Prescriptions  Pending Prescriptions Disp Refills   sildenafil (VIAGRA) 100 MG tablet [Pharmacy Med Name: SILDENAFIL 100 MG TABLET] 10 tablet 2    Sig: TAKE 1 TABLET BY MOUTH DAILY AS NEEDED FOR ERECTILE DYSFUNCTION     Urology: Erectile Dysfunction Agents Failed - 06/21/2022  1:27 PM      Failed - AST in normal range and within 360 days    AST  Date Value Ref Range Status  03/04/2022 48 (H) 0 - 40 IU/L Final         Passed - ALT in normal range and within 360 days    ALT  Date Value Ref Range Status  03/04/2022 31 0 - 44 IU/L Final         Passed - Last BP in normal range    BP Readings from Last 1 Encounters:  06/03/22 131/78         Passed - Valid encounter within last 12 months    Recent Outpatient Visits           2 weeks ago Type 2 diabetes, controlled, with peripheral neuropathy (HCC)   [place] [facility] [place], [place] W, NP   3 months ago Type 2 diabetes, controlled, with peripheral neuropathy [facility])   [place] [facility] [place], [place] W, NP   1 year ago Rheumatoid arthritis involving multiple sites, unspecified whether rheumatoid factor present [facility])   [place] [facility] [place], [place] W, NP   1 year ago Potential exposure to STD   [facility] [place], [place], NP   1 year ago Rheumatoid arthritis involving multiple sites, unspecified whether rheumatoid factor present [facility])   [place] [facility] [place], Shea Stakes, NP       Future Appointments             In 5 months Claiborne Rigg, NP [facility]               Requested Prescriptions  Pending Prescriptions Disp Refills   sildenafil (VIAGRA) 100 MG tablet [Pharmacy Med Name: SILDENAFIL 100 MG TABLET] 10 tablet 2    Sig: TAKE 1 TABLET BY MOUTH DAILY AS NEEDED FOR ERECTILE DYSFUNCTION     Urology: Erectile Dysfunction Agents Failed - 06/21/2022  1:27 PM      Failed - AST in normal range and within 360 days    AST  Date Value Ref Range Status  03/04/2022 48 (H) 0 - 40 IU/L Final         Passed - ALT in normal range and within 360 days    ALT  Date Value Ref Range Status  03/04/2022 31 0 - 44 IU/L Final         Passed - Last BP in normal range    BP Readings from Last 1 Encounters:  06/03/22 131/78         Passed - Valid encounter within last 12 months    Recent Outpatient Visits           2 weeks ago  Type 2 diabetes, controlled, with peripheral neuropathy [facility])   [facility] [place], [place] W, NP   3 months ago Type 2 diabetes, controlled, with peripheral neuropathy [facility])   [place] [facility] [place], [place] W, NP   1 year ago Rheumatoid arthritis involving multiple sites, unspecified whether rheumatoid factor present [facility])   [place] [facility] [place], Shea Stakes, NP   1 year ago Potential exposure to STD   [facility] [place], Shea Stakes, NP   1 year ago Rheumatoid arthritis involving multiple sites, unspecified whether rheumatoid factor present [facility])   [place] [facility] Claiborne Rigg, NP       Future Appointments             In 5 months Claiborne Rigg, NP [facility]

## 2022-06-23 ENCOUNTER — Ambulatory Visit: Payer: Medicaid Other | Attending: Nurse Practitioner | Admitting: Physical Therapy

## 2022-06-23 NOTE — Progress Notes (Deleted)
Office Visit Note  Patient: Tony Ortega             Date of Birth: 26-Jan-1961           MRN: [id number]             PCP: Claiborne Rigg, NP Referring: Claiborne Rigg, NP Visit Date: 06/24/2022 Occupation: @GUAROCC @  Subjective:  No chief complaint on file.   History of Present Illness: Tony Ortega is a 62 y.o. male here for evaluation of arthritis with positive RF and CCP Abs. Originally referred to be seen in 2022 but unable to attend appointment. His medical history is significant for chronic active hepatitis C infection with high viral load. MRI of the cervical and lumbar spine and brain in 2020 showing multilevel degenerative changes with cervical spine impingement.***   Labs reviewed RF 121.5 CCP >250  Activities of Daily Living:  Patient reports morning stiffness for *** {minute/hour:19697}.   Patient {ACTIONS;DENIES/REPORTS:21021675::"Denies"} nocturnal pain.  Difficulty dressing/grooming: {ACTIONS;DENIES/REPORTS:21021675::"Denies"} Difficulty climbing stairs: {ACTIONS;DENIES/REPORTS:21021675::"Denies"} Difficulty getting out of chair: {ACTIONS;DENIES/REPORTS:21021675::"Denies"} Difficulty using hands for taps, buttons, cutlery, and/or writing: {ACTIONS;DENIES/REPORTS:21021675::"Denies"}  No Rheumatology ROS completed.   PMFS History:  Patient Active Problem List   Diagnosis Date Noted   Chronic hepatitis C without hepatic coma (HCC) 01/13/2021   Syphilis 01/13/2021   Long term systemic steroid user 04/12/2019   Cervical disc disease with myelopathy 03/20/2019   Elevated blood-pressure reading, without diagnosis of hypertension 03/01/2019   Long-term use of high-risk medication 02/01/2019   At high risk for injury related to fall 12/13/2018   Lumbar radiculopathy, chronic 12/13/2018   Cervical myelopathy (HCC) 09/07/2018   Hyperlipidemia associated with type 2 diabetes mellitus (HCC) 06/22/2018   Long term current use of aspirin 06/22/2018    Gastroesophageal reflux disease 06/22/2018   History of CVA (cerebrovascular accident) 06/22/2018   History of Helicobacter pylori infection 06/22/2018   Paresthesia 02/24/2018   Gait abnormality 02/24/2018   Weakness 02/24/2018   Tobacco use disorder 02/15/2018   Essential hypertension 02/15/2018   Type 2 diabetes, controlled, with peripheral neuropathy (HCC) 02/15/2018   Rheumatoid arthritis (HCC) 02/15/2018    Past Medical History:  Diagnosis Date   Chronic hepatitis C without hepatic coma (HCC) 01/13/2021   Diabetes mellitus without complication (HCC)    Dyspnea    Hypertension    Neuropathy    Rheumatoid arthritis (HCC)    Stroke (HCC) 09/2018   patient thanks he had a stroke in Jan.2022 but no test was done   Syphilis 01/13/2021    Family History  Problem Relation Age of Onset   Diabetes Mother    Hypertension Mother    Rheum arthritis Mother    Other Father        unsure - thinks he may have history of cancer   CAD Neg Hx    Colon cancer Neg Hx    Colon polyps Neg Hx    Esophageal cancer Neg Hx    Rectal cancer Neg Hx    Stomach cancer Neg Hx    Past Surgical History:  Procedure Laterality Date   ANTERIOR CERVICAL DECOMP/DISCECTOMY FUSION N/A 03/20/2019   Procedure: Cervical four-five Cervical six-seven  Anterior cervical decompression/discectomy/fusion;  Surgeon: Barnett Abu, MD;  Location: [facility] OR;  Service: Neurosurgery;  Laterality: N/A;   KNEE SURGERY Right    Social History   Social History Narrative   Lives at home with mother.   Left-handed.   He released was released from  prison in March 2019.  He served 32 years.   2 cups caffeine per day.       There is no immunization history on file for this patient.   Objective: Vital Signs: There were no vitals taken for this visit.   Physical Exam   Musculoskeletal Exam: ***  CDAI Exam: CDAI Score: -- Patient Global: --; Provider Global: -- Swollen: --; Tender: -- Joint Exam 06/24/2022   No joint  exam has been documented for this visit   There is currently no information documented on the homunculus. Go to the Rheumatology activity and complete the homunculus joint exam.  Investigation: No additional findings.  Imaging: No results found.  Recent Labs: Lab Results  Component Value Date   WBC 6.3 03/04/2022   HGB 16.6 03/04/2022   PLT 251 03/04/2022   NA 136 03/04/2022   K 4.0 03/04/2022   CL 97 03/04/2022   CO2 23 03/04/2022   GLUCOSE 133 (H) 03/04/2022   BUN 9 03/04/2022   CREATININE 1.21 03/04/2022   BILITOT 0.6 03/04/2022   ALKPHOS 116 03/04/2022   AST 48 (H) 03/04/2022   ALT 31 03/04/2022   PROT 7.9 03/04/2022   ALBUMIN 4.4 03/04/2022   CALCIUM 10.0 03/04/2022   GFRAA 103 03/13/2020    Speciality Comments: No specialty comments available.  Procedures:  No procedures performed Allergies: Pork-derived products   Assessment / Plan:     Visit Diagnoses: No diagnosis found.  Orders: No orders of the defined types were placed in this encounter.  No orders of the defined types were placed in this encounter.   Face-to-face time spent with patient was *** minutes. Greater than 50% of time was spent in counseling and coordination of care.  Follow-Up Instructions: No follow-ups on file.   Fuller Plan, MD  Note - This record has been created using [employer].  Chart creation errors have been sought, but may not always  have been located. Such creation errors do not reflect on  the standard of medical care.

## 2022-06-24 ENCOUNTER — Encounter: Payer: Medicaid Other | Admitting: Internal Medicine

## 2022-07-01 ENCOUNTER — Ambulatory Visit: Payer: Medicaid Other | Admitting: Podiatry

## 2022-07-20 ENCOUNTER — Other Ambulatory Visit: Payer: Self-pay

## 2022-07-20 ENCOUNTER — Ambulatory Visit: Payer: Medicaid Other | Admitting: Podiatry

## 2022-07-21 ENCOUNTER — Other Ambulatory Visit: Payer: Self-pay

## 2022-11-04 ENCOUNTER — Other Ambulatory Visit: Payer: Self-pay

## 2022-11-09 ENCOUNTER — Other Ambulatory Visit: Payer: Self-pay | Admitting: Nurse Practitioner

## 2022-11-09 ENCOUNTER — Other Ambulatory Visit: Payer: Self-pay

## 2022-11-09 DIAGNOSIS — I10 Essential (primary) hypertension: Secondary | ICD-10-CM

## 2022-11-09 DIAGNOSIS — E1169 Type 2 diabetes mellitus with other specified complication: Secondary | ICD-10-CM

## 2022-11-09 MED ORDER — AMLODIPINE BESYLATE 10 MG PO TABS
10.0000 mg | ORAL_TABLET | Freq: Every day | ORAL | 1 refills | Status: DC
Start: 2022-11-09 — End: 2023-06-01
  Filled 2022-11-09: qty 90, 90d supply, fill #0
  Filled 2023-02-12: qty 90, 90d supply, fill #1

## 2022-11-09 MED ORDER — ATORVASTATIN CALCIUM 20 MG PO TABS
20.0000 mg | ORAL_TABLET | Freq: Every day | ORAL | 1 refills | Status: DC
  Filled 2022-11-09: qty 90, 90d supply, fill #0

## 2022-11-09 MED ORDER — HYDROCHLOROTHIAZIDE 25 MG PO TABS
25.0000 mg | ORAL_TABLET | Freq: Every day | ORAL | 1 refills | Status: DC
Start: 2022-11-09 — End: 2023-06-01
  Filled 2022-11-09: qty 90, 90d supply, fill #0
  Filled 2023-02-12: qty 90, 90d supply, fill #1

## 2022-12-04 ENCOUNTER — Ambulatory Visit: Payer: Medicaid Other | Admitting: Nurse Practitioner

## 2023-02-12 ENCOUNTER — Other Ambulatory Visit: Payer: Self-pay

## 2023-05-31 ENCOUNTER — Telehealth: Payer: Self-pay

## 2023-05-31 ENCOUNTER — Other Ambulatory Visit: Payer: Self-pay | Admitting: Nurse Practitioner

## 2023-05-31 ENCOUNTER — Other Ambulatory Visit: Payer: Self-pay

## 2023-05-31 ENCOUNTER — Ambulatory Visit: Attending: Family Medicine

## 2023-05-31 DIAGNOSIS — I10 Essential (primary) hypertension: Secondary | ICD-10-CM

## 2023-05-31 NOTE — Telephone Encounter (Signed)
 No refills until bloodwork

## 2023-05-31 NOTE — Telephone Encounter (Signed)
 Noted.

## 2023-05-31 NOTE — Telephone Encounter (Signed)
 Patient is requesting refills on blood pressure medication unsure of the name, advised patient he may need an appointment as he was last seen April of 2024 , offered appointment for today at 2:10 but patient refused to schedule.

## 2023-06-01 ENCOUNTER — Ambulatory Visit: Admitting: Physician Assistant

## 2023-06-01 ENCOUNTER — Other Ambulatory Visit: Payer: Self-pay

## 2023-06-01 ENCOUNTER — Encounter: Payer: Self-pay | Admitting: Physician Assistant

## 2023-06-01 VITALS — BP 118/70 | HR 74 | Ht 66.0 in | Wt 131.0 lb

## 2023-06-01 DIAGNOSIS — Z7984 Long term (current) use of oral hypoglycemic drugs: Secondary | ICD-10-CM

## 2023-06-01 DIAGNOSIS — E1142 Type 2 diabetes mellitus with diabetic polyneuropathy: Secondary | ICD-10-CM

## 2023-06-01 DIAGNOSIS — I10 Essential (primary) hypertension: Secondary | ICD-10-CM | POA: Diagnosis not present

## 2023-06-01 LAB — CMP14+EGFR
ALT: 25 IU/L (ref 0–44)
AST: 40 IU/L (ref 0–40)
Albumin: 4.2 g/dL (ref 3.9–4.9)
Alkaline Phosphatase: 94 IU/L (ref 44–121)
BUN/Creatinine Ratio: 10 (ref 10–24)
BUN: 10 mg/dL (ref 8–27)
Bilirubin Total: 0.6 mg/dL (ref 0.0–1.2)
CO2: 23 mmol/L (ref 20–29)
Calcium: 9.4 mg/dL (ref 8.6–10.2)
Chloride: 102 mmol/L (ref 96–106)
Creatinine, Ser: 1.03 mg/dL (ref 0.76–1.27)
Globulin, Total: 3 g/dL (ref 1.5–4.5)
Glucose: 124 mg/dL — ABNORMAL HIGH (ref 70–99)
Potassium: 4.7 mmol/L (ref 3.5–5.2)
Sodium: 137 mmol/L (ref 134–144)
Total Protein: 7.2 g/dL (ref 6.0–8.5)
eGFR: 81 mL/min/{1.73_m2} (ref >59)

## 2023-06-01 LAB — HEMOGLOBIN A1C
Est. average glucose Bld gHb Est-mCnc: 137 mg/dL
Hgb A1c MFr Bld: 6.4 % — ABNORMAL HIGH (ref 4.8–5.6)

## 2023-06-01 MED ORDER — AMLODIPINE BESYLATE 10 MG PO TABS
10.0000 mg | ORAL_TABLET | Freq: Every day | ORAL | 1 refills | Status: AC
  Filled 2023-06-01: qty 90, 90d supply, fill #0
  Filled 2023-09-08: qty 90, 90d supply, fill #1

## 2023-06-01 MED ORDER — HYDROCHLOROTHIAZIDE 25 MG PO TABS
25.0000 mg | ORAL_TABLET | Freq: Every day | ORAL | 1 refills | Status: AC
  Filled 2023-06-01: qty 90, 90d supply, fill #0
  Filled 2023-09-08: qty 90, 90d supply, fill #1

## 2023-06-01 NOTE — Patient Instructions (Addendum)
 VISIT SUMMARY:  You came in today for a medication refill for your hypertension and diabetes. Your blood pressure is well controlled at 118/70, and your recent A1c was 6.4, indicating good blood sugar control. We discussed your current medications and dietary habits.  YOUR PLAN:  -HYPERTENSION: Hypertension means high blood pressure. Your blood pressure is well controlled at 118/70 mmHg. We have refilled your medications, amlodipine  10 mg and hydrochlorothiazide  25 mg, and sent the prescriptions to [facility].  -TYPE 2 DIABETES MELLITUS: Type 2 diabetes is a condition where your body does not use insulin  properly, leading to high blood sugar levels. Your diabetes is well controlled with an A1c of 6.4%. You manage your blood sugar through diet and intermittent use of metformin  500 mg due to side effects.    Health Maintenance, Male Adopting a healthy lifestyle and getting preventive care are important in promoting health and wellness. Ask your health care provider about: The right schedule for you to have regular tests and exams. Things you can do on your own to prevent diseases and keep yourself healthy. What should I know about diet, weight, and exercise? Eat a healthy diet  Eat a diet that includes plenty of vegetables, fruits, low-fat dairy products, and lean protein. Do not eat a lot of foods that are high in solid fats, added sugars, or sodium. Maintain a healthy weight Body mass index (BMI) is a measurement that can be used to identify possible weight problems. It estimates body fat based on height and weight. Your health care provider can help determine your BMI and help you achieve or maintain a healthy weight. Get regular exercise Get regular exercise. This is one of the most important things you can do for your health. Most adults should: Exercise for at least 150 minutes each week. The exercise should increase your heart rate and make you sweat  (moderate-intensity exercise). Do strengthening exercises at least twice a week. This is in addition to the moderate-intensity exercise. Spend less time sitting. Even light physical activity can be beneficial. Watch cholesterol and blood lipids Have your blood tested for lipids and cholesterol at 65 years of age, then have this test every 5 years. You may need to have your cholesterol levels checked more often if: Your lipid or cholesterol levels are high. You are older than 65 years of age. You are at high risk for heart disease. What should I know about cancer screening? Many types of cancers can be detected early and may often be prevented. Depending on your health history and family history, you may need to have cancer screening at various ages. This may include screening for: Colorectal cancer. Prostate cancer. Skin cancer. Lung cancer. What should I know about heart disease, diabetes, and high blood pressure? Blood pressure and heart disease High blood pressure causes heart disease and increases the risk of stroke. This is more likely to develop in people who have high blood pressure readings or are overweight. Talk with your health care provider about your target blood pressure readings. Have your blood pressure checked: Every 3-5 years if you are 82-70 years of age. Every year if you are 23 years old or older. If you are between the ages of 47 and 41 and are a current or former smoker, ask your health care provider if you should have a one-time screening for abdominal aortic aneurysm (AAA). Diabetes Have regular diabetes screenings. This checks your fasting blood sugar level. Have the screening done: Once  every three years after age 54 if you are at a normal weight and have a low risk for diabetes. More often and at a younger age if you are overweight or have a high risk for diabetes. What should I know about preventing infection? Hepatitis B If you have a higher risk for  hepatitis B, you should be screened for this virus. Talk with your health care provider to find out if you are at risk for hepatitis B infection. Hepatitis C Blood testing is recommended for: Everyone born from [birth year] through 1967. Anyone with known risk factors for hepatitis C. Sexually transmitted infections (STIs) You should be screened each year for STIs, including gonorrhea and chlamydia, if: You are sexually active and are younger than 64 years of age. You are older than 65 years of age and your health care provider tells you that you are at risk for this type of infection. Your sexual activity has changed since you were last screened, and you are at increased risk for chlamydia or gonorrhea. Ask your health care provider if you are at risk. Ask your health care provider about whether you are at high risk for HIV. Your health care provider may recommend a prescription medicine to help prevent HIV infection. If you choose to take medicine to prevent HIV, you should first get tested for HIV. You should then be tested every 3 months for as long as you are taking the medicine. Follow these instructions at home: Alcohol use Do not drink alcohol if your health care provider tells you not to drink. If you drink alcohol: Limit how much you have to 0-2 drinks a day. Know how much alcohol is in your drink. In the [place] one drink equals one 12 oz bottle of beer (355 mL), one 5 oz glass of wine (148 mL), or one 1 oz glass of hard liquor (44 mL). Lifestyle Do not use any products that contain nicotine or tobacco. These products include cigarettes, chewing tobacco, and vaping devices, such as e-cigarettes. If you need help quitting, ask your health care provider. Do not use street drugs. Do not share needles. Ask your health care provider for help if you need support or information about quitting drugs. General instructions Schedule regular health, dental, and eye exams. Stay current with your  vaccines. Tell your health care provider if: You often feel depressed. You have ever been abused or do not feel safe at home. Summary Adopting a healthy lifestyle and getting preventive care are important in promoting health and wellness. Follow your health care provider's instructions about healthy diet, exercising, and getting tested or screened for diseases. Follow your health care provider's instructions on monitoring your cholesterol and blood pressure. This information is not intended to replace advice given to you by your health care provider. Make sure you discuss any questions you have with your health care provider. Document Revised: 06/17/2020 Document Reviewed: 06/17/2020 Elsevier Patient Education  2024 [employer].

## 2023-06-01 NOTE — Telephone Encounter (Signed)
 Patient has been informed of this and did have his blood drawn.

## 2023-06-01 NOTE — Progress Notes (Unsigned)
 Established Patient Office Visit  Subjective   Patient ID: Tony Ortega, male    DOB: September 05, 1960  Age: 63 y.o. MRN: [id number]  Chief Complaint  Patient presents with   Medication Refill    Bp medication, Patient went to [facility] yesterday for medication refill, Patient received blood work, but wasn't seen by physician.   Patient has been without medication since last Thursday    Discussed the use of AI scribe software for clinical note transcription with the patient, who gave verbal consent to proceed.  History of Present Illness   The patient, with a history of hypertension and diabetes, presents for a medication refill. He has been out of his blood pressure medication, amlodipine  10mg  and hydrochlorothiazide  25mg , since last Thursday. Despite this, his blood pressure is well controlled at 118/70, which he attributes to dietary intake of red onions.  He also has diabetes, controlled with metformin  500mg . However, he only takes the medication 'every now and then' when he feels his blood sugars going up. He reports that the metformin  causes his heart to 'rattle,' which he finds uncomfortable. Despite this irregular medication use, his recent A1c was 6.4, indicating good blood sugar control. He attributes this to his cooking skills and knowledge of what to fix.    Past Medical History:  Diagnosis Date   Chronic hepatitis C without hepatic coma (HCC) 01/13/2021   Diabetes mellitus without complication (HCC)    Dyspnea    Hypertension    Neuropathy    Rheumatoid arthritis (HCC)    Stroke (HCC) 09/2018   patient thanks he had a stroke in Jan.2022 but no test was done   Syphilis 01/13/2021   Social History   Socioeconomic History   Marital status: Married    Spouse name: Not on file   Number of children: 2   Years of education: 12   Highest education level: Not on file  Occupational History   Occupation: Disabled  Tobacco Use   Smoking status: Every Day    Current packs/day:  0.10    Average packs/day: 0.1 packs/day for 12.0 years (1.2 ttl pk-yrs)    Types: Cigarettes   Smokeless tobacco: Never  Vaping Use   Vaping status: Never Used  Substance and Sexual Activity   Alcohol use: Yes    Comment: once a month drink per pt   Drug use: Yes    Types: Marijuana   Sexual activity: Not Currently  Other Topics Concern   Not on file  Social History Narrative   Lives at home with mother.   Left-handed.   He released was released from prison in March 2019.  He served 32 years.   2 cups caffeine per day.      Social Drivers of Corporate investment banker Strain: Not on file  Food Insecurity: Not on file  Transportation Needs: Not on file  Physical Activity: Not on file  Stress: Not on file  Social Connections: Not on file  Intimate Partner Violence: Not on file   Family History  Problem Relation Age of Onset   Diabetes Mother    Hypertension Mother    Rheum arthritis Mother    Other Father        unsure - thinks he may have history of cancer   CAD Neg Hx    Colon cancer Neg Hx    Colon polyps Neg Hx    Esophageal cancer Neg Hx    Rectal cancer Neg Hx  Stomach cancer Neg Hx    Allergies  Allergen Reactions   Pork-Derived Products Other (See Comments)    No pork products    Review of Systems  Constitutional: Negative.   HENT: Negative.    Eyes: Negative.   Respiratory:  Negative for shortness of breath.   Cardiovascular:  Negative for chest pain.  Gastrointestinal: Negative.   Genitourinary: Negative.   Musculoskeletal: Negative.   Skin: Negative.   Neurological: Negative.   Endo/Heme/Allergies: Negative.   Psychiatric/Behavioral: Negative.        Objective:     BP 118/70 (BP Location: Left Arm, Patient Position: Sitting, Cuff Size: Normal)   Pulse 74   Ht 5\' 6"  (1.676 m)   Wt 131 lb (59.4 kg)   SpO2 98%   BMI 21.14 kg/m  BP Readings from Last 3 Encounters:  06/01/23 118/70  06/03/22 131/78  03/04/22 (!) 144/84   Wt  Readings from Last 3 Encounters:  06/01/23 131 lb (59.4 kg)  06/03/22 126 lb (57.2 kg)  03/04/22 124 lb (56.2 kg)    Physical Exam Vitals and nursing note reviewed.    GENERAL: Alert, cooperative, well developed, no acute distress. HEENT: Normocephalic, normal oropharynx, moist mucous membranes. CHEST: Clear to auscultation bilaterally, no wheezes, rhonchi, or crackles. CARDIOVASCULAR: Normal heart rate and rhythm, S1 and S2 normal without murmurs.  EXTREMITIES: No cyanosis or edema. NEUROLOGICAL: Cranial nerves grossly intact, moves all extremities without gross motor or sensory deficit.     Assessment & Plan:   Problem List Items Addressed This Visit       Endocrine   Type 2 diabetes, controlled, with peripheral neuropathy (HCC) - Primary   Other Visit Diagnoses       Primary hypertension       Relevant Medications   amLODipine  (NORVASC ) 10 MG tablet   hydrochlorothiazide  (HYDRODIURIL ) 25 MG tablet       Assessment and Plan Hypertension Hypertension well-controlled at 118/70 mmHg. He adheres to medication and dietary choices. - Refilled amlodipine  10 mg and hydrochlorothiazide  25 mg. - [facility] prescriptions to [facility].  Type 2 diabetes mellitus Type 2 diabetes well-controlled with A1c of 6.4%. He manages blood sugar through diet and prefers intermittent metformin  use due to side effects. - Continue metformin  500 mg as needed.   I have reviewed the patient's medical history (PMH, PSH, Social History, Family History, Medications, and allergies) , and have been updated if relevant. I spent 30 minutes reviewing chart and  face to face time with patient.    Return if symptoms worsen or fail to improve.    Etter Hermann Mayers, PA-C

## 2023-06-02 ENCOUNTER — Encounter: Payer: Self-pay | Admitting: Physician Assistant

## 2023-06-07 ENCOUNTER — Ambulatory Visit: Attending: Nurse Practitioner | Admitting: Nurse Practitioner

## 2023-09-08 ENCOUNTER — Other Ambulatory Visit: Payer: Self-pay

## 2023-09-10 ENCOUNTER — Other Ambulatory Visit: Payer: Self-pay

## 2023-11-02 ENCOUNTER — Telehealth: Payer: Self-pay | Admitting: Nurse Practitioner

## 2023-11-02 NOTE — Telephone Encounter (Signed)
 Unable to reach patient by phone to confirm [facility] was a company he wanted to use. Requested call back.

## 2023-11-02 NOTE — Telephone Encounter (Signed)
 Copied from CRM [id number]. Topic: General - Other >> Nov 02, 2023  4:52 PM Turkey B wrote:  Reason for CRM: sam from [facility] called to get list of patient meds. Says requested back on 09/19. Fx is [phone]

## 2023-11-05 ENCOUNTER — Other Ambulatory Visit: Payer: Self-pay

## 2023-11-08 ENCOUNTER — Other Ambulatory Visit: Payer: Self-pay

## 2023-11-09 ENCOUNTER — Telehealth: Payer: Self-pay | Admitting: Nurse Practitioner

## 2023-11-09 NOTE — Telephone Encounter (Signed)
 Spoke to [employer], [employer].  Informed Clinical cytogeneticist, of NKDA.  Intolerance to Pork.

## 2023-11-09 NOTE — Telephone Encounter (Signed)
 Copied from CRM #[id number]. Topic: Clinical - Medication Question >> Nov 09, 2023  1:53 PM Tiffini S wrote: Reason for CRM: Tram with [facility] states that the patient medication was transferred to their pharmacy from another pharmacy- they are trying to refill the medication and needs to know if he is allergic to anything   Please call back at [phone]

## 2023-11-11 ENCOUNTER — Other Ambulatory Visit: Payer: Self-pay

## 2023-11-11 NOTE — Telephone Encounter (Unsigned)
 Copied from CRM [id number]. Topic: General - Other >> Nov 11, 2023  9:34 AM Pinkey ORN wrote: Reason for CRM: [facility]

## 2023-11-13 ENCOUNTER — Other Ambulatory Visit: Payer: Self-pay | Admitting: Nurse Practitioner

## 2023-11-13 DIAGNOSIS — E1169 Type 2 diabetes mellitus with other specified complication: Secondary | ICD-10-CM

## 2023-11-18 NOTE — Telephone Encounter (Signed)
 [facility] is calling and requesting a list for all of his active medications. This has been faxed over and they have called 2 other times. Please advise. Fax info to [phone].

## 2023-11-22 NOTE — Telephone Encounter (Signed)
 Unable to reach a representative to verify fax number.  Please confirm and have rep refax. Have not received request. Though someone from [facility] reached out to our office for potential allergies before filling medications. So do they still need the medications list or not ?

## 2023-11-23 ENCOUNTER — Other Ambulatory Visit (HOSPITAL_COMMUNITY): Payer: Self-pay

## 2023-11-23 MED ORDER — FLUTICASONE PROPIONATE 50 MCG/ACT NA SUSP
1.0000 | Freq: Every day | NASAL | 3 refills | Status: AC
  Filled 2023-11-23: qty 48, 90d supply, fill #0

## 2023-11-23 MED ORDER — ACETAMINOPHEN-CODEINE 300-15 MG PO TABS
1.0000 | ORAL_TABLET | Freq: Four times a day (QID) | ORAL | 0 refills | Status: AC | PRN
  Filled 2023-11-23 – 2023-12-21 (×3): qty 40, 10d supply, fill #0

## 2023-11-23 MED FILL — Ibuprofen Tab 800 MG: 800.0000 mg | ORAL | 30 days supply | Qty: 90 | Fill #0 | Status: CN

## 2023-11-24 ENCOUNTER — Other Ambulatory Visit: Payer: Self-pay

## 2023-11-26 ENCOUNTER — Other Ambulatory Visit: Payer: Self-pay | Admitting: Physician Assistant

## 2023-11-26 ENCOUNTER — Other Ambulatory Visit: Payer: Self-pay

## 2023-11-26 DIAGNOSIS — I10 Essential (primary) hypertension: Secondary | ICD-10-CM

## 2023-12-01 ENCOUNTER — Other Ambulatory Visit (HOSPITAL_COMMUNITY): Payer: Self-pay

## 2023-12-01 MED ORDER — IBUPROFEN 800 MG PO TABS
800.0000 mg | ORAL_TABLET | Freq: Three times a day (TID) | ORAL | 0 refills | Status: AC | PRN
  Filled 2023-12-01 – 2023-12-06 (×2): qty 90, 30d supply, fill #0

## 2023-12-01 MED ORDER — ACETAMINOPHEN-CODEINE 300-15 MG PO TABS
1.0000 | ORAL_TABLET | Freq: Four times a day (QID) | ORAL | 0 refills | Status: AC | PRN
  Filled 2023-12-01: qty 40, 10d supply, fill #0
  Filled 2023-12-06: qty 28, 7d supply, fill #0

## 2023-12-01 MED ORDER — FLUTICASONE PROPIONATE 50 MCG/ACT NA SUSP
1.0000 | Freq: Every day | NASAL | 3 refills | Status: AC | PRN
  Filled 2023-12-01 – 2023-12-06 (×2): qty 48, 90d supply, fill #0

## 2023-12-03 ENCOUNTER — Other Ambulatory Visit (HOSPITAL_COMMUNITY): Payer: Self-pay

## 2023-12-06 ENCOUNTER — Other Ambulatory Visit (HOSPITAL_COMMUNITY): Payer: Self-pay

## 2023-12-06 ENCOUNTER — Other Ambulatory Visit: Payer: Self-pay

## 2023-12-16 ENCOUNTER — Other Ambulatory Visit: Payer: Self-pay | Admitting: Family Medicine

## 2023-12-16 DIAGNOSIS — E1169 Type 2 diabetes mellitus with other specified complication: Secondary | ICD-10-CM

## 2023-12-21 ENCOUNTER — Other Ambulatory Visit: Payer: Self-pay | Admitting: Nurse Practitioner

## 2023-12-21 ENCOUNTER — Other Ambulatory Visit (HOSPITAL_COMMUNITY): Payer: Self-pay

## 2023-12-21 ENCOUNTER — Other Ambulatory Visit: Payer: Self-pay

## 2023-12-21 DIAGNOSIS — E1169 Type 2 diabetes mellitus with other specified complication: Secondary | ICD-10-CM

## 2023-12-21 NOTE — Telephone Encounter (Signed)
 Copied from CRM [id number]. Topic: Clinical - Medication Refill >> Dec 21, 2023  2:14 PM Dedra B wrote: Medication: atorvastatin  (LIPITOR) 20 MG tablet  Has the patient contacted their pharmacy? Yes, CJ from [facility] is the caller   This is the patient's preferred pharmacy:   [facility] - [place] [place] - [address] [place] [address] Phone: [phone] Fax: [phone]  Is this the correct pharmacy for this prescription? Yes  Has the prescription been filled recently? No  Is the patient out of the medication? Yes  Has the patient been seen for an appointment in the last year OR does the patient have an upcoming appointment? No  Can we respond through MyChart? No  Agent: Please be advised that Rx refills may take up to 3 business days. We ask that you follow-up with your pharmacy.

## 2023-12-22 ENCOUNTER — Other Ambulatory Visit (HOSPITAL_COMMUNITY): Payer: Self-pay

## 2023-12-22 MED ORDER — HYDROCHLOROTHIAZIDE 25 MG PO TABS
25.0000 mg | ORAL_TABLET | Freq: Every morning | ORAL | 3 refills | Status: AC
  Filled 2023-12-22 – 2023-12-23 (×2): qty 90, 90d supply, fill #0

## 2023-12-22 MED ORDER — AMLODIPINE BESYLATE 10 MG PO TABS
10.0000 mg | ORAL_TABLET | Freq: Every morning | ORAL | 3 refills | Status: AC
  Filled 2023-12-22 – 2023-12-23 (×2): qty 90, 90d supply, fill #0

## 2023-12-23 ENCOUNTER — Other Ambulatory Visit: Payer: Self-pay | Admitting: Physician Assistant

## 2023-12-23 ENCOUNTER — Other Ambulatory Visit (HOSPITAL_COMMUNITY): Payer: Self-pay

## 2023-12-23 DIAGNOSIS — I10 Essential (primary) hypertension: Secondary | ICD-10-CM

## 2023-12-23 MED ORDER — HYDROCHLOROTHIAZIDE 25 MG PO TABS
25.0000 mg | ORAL_TABLET | Freq: Every morning | ORAL | 3 refills | Status: DC
  Filled 2023-12-23: qty 90, 90d supply, fill #0

## 2023-12-23 MED ORDER — AMLODIPINE BESYLATE 10 MG PO TABS
10.0000 mg | ORAL_TABLET | Freq: Every morning | ORAL | 3 refills | Status: DC
  Filled 2023-12-23: qty 90, 90d supply, fill #0

## 2023-12-28 ENCOUNTER — Other Ambulatory Visit: Payer: Self-pay | Admitting: Nurse Practitioner

## 2023-12-28 DIAGNOSIS — E1169 Type 2 diabetes mellitus with other specified complication: Secondary | ICD-10-CM

## 2023-12-28 NOTE — Telephone Encounter (Signed)
 Copied from CRM [id number]. Topic: Clinical - Medication Refill >> Dec 28, 2023 12:52 PM Pinkey ORN wrote: Medication: atorvastatin  (LIPITOR) 20 MG tablet  Has the patient contacted their pharmacy? Yes (Agent: If no, request that the patient contact the pharmacy for the refill. If patient does not wish to contact the pharmacy document the reason why and proceed with request.) (Agent: If yes, when and what did the pharmacy advise?)  This is the patient's preferred pharmacy:  [facility] - [place], [place] - [address] [place] [address] Phone: [phone] Fax: [phone]  Is this the correct pharmacy for this prescription? Yes If no, delete pharmacy and type the correct one.   Has the prescription been filled recently? No  Is the patient out of the medication? Yes  Has the patient been seen for an appointment in the last year OR does the patient have an upcoming appointment? Yes  Can we respond through MyChart? Yes  Agent: Please be advised that Rx refills may take up to 3 business days. We ask that you follow-up with your pharmacy.

## 2023-12-30 NOTE — Telephone Encounter (Signed)
 Requested medications are due for refill today.  yes  Requested medications are on the active medications list.  yes  Last refill. 11/14/2023 #30 0 rf  Future visit scheduled.   no  Notes to clinic.  Labs are expired. Pt needs an appt. Courtesy refill already given.    Requested Prescriptions  Pending Prescriptions Disp Refills   atorvastatin  (LIPITOR) 20 MG tablet 30 tablet 0    Sig: Take 1 tablet (20 mg total) by mouth daily. Please schedule PCP appointment for additional refills.     Cardiovascular:  Antilipid - Statins Failed - 12/30/2023  5:53 PM      Failed - Valid encounter within last 12 months    Recent Outpatient Visits           1 year ago Type 2 diabetes, controlled, with peripheral neuropathy (HCC)   [facility] [place]. [facility] Theotis Haze ORN, NP   1 year ago Type 2 diabetes, controlled, with peripheral neuropathy [facility])   [facility] [place]. [facility] [place], [place] W, NP   2 years ago Rheumatoid arthritis involving multiple sites, unspecified whether rheumatoid factor present [facility])   [facility] [place]. [facility] Theotis Haze ORN, NP   2 years ago Potential exposure to STD   [facility] [place]. [facility] [place], [place] W, NP   2 years ago Rheumatoid arthritis involving multiple sites, unspecified whether rheumatoid factor present [facility])   [facility] [place], [place] W, NP              Failed - Lipid Panel in normal range within the last 12 months    Cholesterol, Total  Date Value Ref Range Status  03/13/2020 155 100 - 199 mg/dL Final   LDL Chol Calc (NIH)  Date Value Ref Range Status  03/13/2020 74 0 - 99 mg/dL Final   HDL  Date Value Ref Range Status  03/13/2020 63 >39 mg/dL Final   Triglycerides  Date  Value Ref Range Status  03/13/2020 100 0 - 149 mg/dL Final         Passed - Patient is not pregnant

## 2024-01-05 ENCOUNTER — Other Ambulatory Visit: Payer: Self-pay | Admitting: Family Medicine

## 2024-01-05 DIAGNOSIS — E1169 Type 2 diabetes mellitus with other specified complication: Secondary | ICD-10-CM

## 2024-01-11 ENCOUNTER — Telehealth: Payer: Self-pay | Admitting: Nurse Practitioner

## 2024-01-11 DIAGNOSIS — E1169 Type 2 diabetes mellitus with other specified complication: Secondary | ICD-10-CM

## 2024-01-11 NOTE — Telephone Encounter (Unsigned)
 Copied from CRM #[id number]. Topic: Clinical - Medication Refill >> Jan 11, 2024  5:16 PM Carla L wrote: Medication: atorvastatin  (LIPITOR) 20 MG tablet  Has the patient contacted their pharmacy? Yes Pharmacy calling on behalf of patient.   This is the patient's preferred pharmacy:  [facility] - [place], [place] - [address] [place] [address] Phone: [phone] Fax: [phone]  Is this the correct pharmacy for this prescription? Yes   Has the prescription been filled recently? No  Is the patient out of the medication? Yes  Has the patient been seen for an appointment in the last year OR does the patient have an upcoming appointment? Yes  Can we respond through MyChart? No  Agent: Please be advised that Rx refills may take up to 3 business days. We ask that you follow-up with your pharmacy.

## 2024-01-14 NOTE — Telephone Encounter (Signed)
 Requested medications are due for refill today.  yes  Requested medications are on the active medications list.  yes  Last refill. 11/14/2023 #30 0 rf  Future visit scheduled.   no  Notes to clinic.  Pt is over due for an ov. Courtesy refill already given.    Requested Prescriptions  Pending Prescriptions Disp Refills   atorvastatin  (LIPITOR) 20 MG tablet 30 tablet 0    Sig: Take 1 tablet (20 mg total) by mouth daily. Please schedule PCP appointment for additional refills.     Cardiovascular:  Antilipid - Statins Failed - 01/14/2024  1:16 PM      Failed - Valid encounter within last 12 months    Recent Outpatient Visits           1 year ago Type 2 diabetes, controlled, with peripheral neuropathy (HCC)   [facility] [place]. [facility] Theotis Haze ORN, NP   1 year ago Type 2 diabetes, controlled, with peripheral neuropathy [facility])   [facility] [place]. [facility] [place], [place] W, NP   2 years ago Rheumatoid arthritis involving multiple sites, unspecified whether rheumatoid factor present [facility])   [facility] [place]. [facility] Theotis Haze ORN, NP   3 years ago Potential exposure to STD   [facility] [place]. [facility] [place], [place] W, NP   3 years ago Rheumatoid arthritis involving multiple sites, unspecified whether rheumatoid factor present [facility])    [facility] [place], [place] W, NP              Failed - Lipid Panel in normal range within the last 12 months    Cholesterol, Total  Date Value Ref Range Status  03/13/2020 155 100 - 199 mg/dL Final   LDL Chol Calc (NI[facility])  Date Value Ref Range Status  03/13/2020 74 0 - 99 mg/dL Final   HDL  Date Value Ref Range Status  03/13/2020 63 >39 mg/dL Final   Triglycerides  Date Value Ref  Range Status  03/13/2020 100 0 - 149 mg/dL Final         Passed - Patient is not pregnant
